# Patient Record
Sex: Female | Born: 1965 | Race: White | Hispanic: No | Smoking: Never smoker
Health system: Southern US, Community
[De-identification: ages and names within clinical notes are randomized; demographics above are authoritative.]

## PROBLEM LIST (undated history)

## (undated) DIAGNOSIS — N951 Menopausal and female climacteric states: Secondary | ICD-10-CM

## (undated) DIAGNOSIS — J45909 Unspecified asthma, uncomplicated: Secondary | ICD-10-CM

## (undated) DIAGNOSIS — T8859XA Other complications of anesthesia, initial encounter: Secondary | ICD-10-CM

## (undated) DIAGNOSIS — E039 Hypothyroidism, unspecified: Secondary | ICD-10-CM

## (undated) DIAGNOSIS — Z201 Contact with and (suspected) exposure to tuberculosis: Secondary | ICD-10-CM

## (undated) DIAGNOSIS — I10 Essential (primary) hypertension: Secondary | ICD-10-CM

## (undated) DIAGNOSIS — N6019 Diffuse cystic mastopathy of unspecified breast: Secondary | ICD-10-CM

## (undated) DIAGNOSIS — E559 Vitamin D deficiency, unspecified: Secondary | ICD-10-CM

## (undated) DIAGNOSIS — F419 Anxiety disorder, unspecified: Secondary | ICD-10-CM

## (undated) DIAGNOSIS — F341 Dysthymic disorder: Secondary | ICD-10-CM

## (undated) DIAGNOSIS — E669 Obesity, unspecified: Secondary | ICD-10-CM

## (undated) HISTORY — DX: Dysthymic disorder: F34.1

## (undated) HISTORY — DX: Menopausal and female climacteric states: N95.1

## (undated) HISTORY — DX: Diffuse cystic mastopathy of unspecified breast: N60.19

## (undated) HISTORY — PX: CHOLECYSTECTOMY: SHX55

## (undated) HISTORY — PX: ABDOMINAL HYSTERECTOMY: SHX81

## (undated) HISTORY — DX: Vitamin D deficiency, unspecified: E55.9

## (undated) HISTORY — DX: Hypothyroidism, unspecified: E03.9

## (undated) HISTORY — DX: Obesity, unspecified: E66.9

## (undated) HISTORY — PX: TOTAL ABDOMINAL HYSTERECTOMY: SHX209

## (undated) HISTORY — DX: Anxiety disorder, unspecified: F41.9

## (undated) HISTORY — PX: DG LEFT TIBIA AND FIBULA (ARMC HX): HXRAD1562

## (undated) HISTORY — PX: BREAST BIOPSY: SHX20

---

## 2000-02-27 HISTORY — PX: ANTERIOR CRUCIATE LIGAMENT REPAIR: SHX115

## 2004-07-03 ENCOUNTER — Ambulatory Visit: Payer: Self-pay | Admitting: Internal Medicine

## 2004-07-04 ENCOUNTER — Ambulatory Visit: Payer: Self-pay | Admitting: Internal Medicine

## 2004-07-20 ENCOUNTER — Ambulatory Visit: Payer: Self-pay | Admitting: Surgery

## 2008-02-27 HISTORY — PX: SEPTOPLASTY: SUR1290

## 2010-08-14 LAB — HM PAP SMEAR

## 2010-12-13 ENCOUNTER — Ambulatory Visit: Payer: Self-pay | Admitting: Obstetrics and Gynecology

## 2010-12-21 ENCOUNTER — Inpatient Hospital Stay: Payer: Self-pay | Admitting: Obstetrics and Gynecology

## 2010-12-25 LAB — PATHOLOGY REPORT

## 2011-03-26 LAB — HM MAMMOGRAPHY

## 2012-12-29 IMAGING — CT CT ABD-PELV W/ CM
1 of 4 series · 11 of 32 positions shown, 17 images · non-contrast
Comparison: none

REASON FOR EXAM: duplicated uterus and cervix eval ureters for normal
location
COMMENTS:

[Series 2: abdomen · axial · 0.90mm/px · z∈[-496,-132]mm · 11 of 89 slices shown, 17 images]
[im 8/89  soft-tissue]
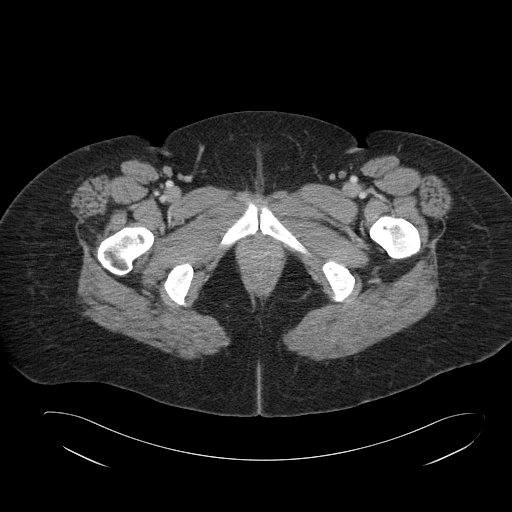
[im 8/89  bone]
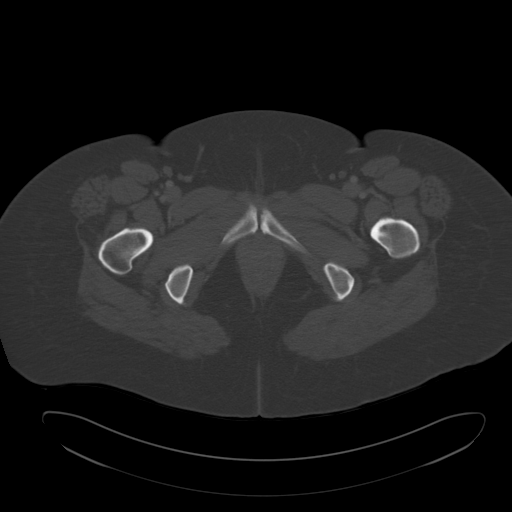
[im 15/89  soft-tissue]
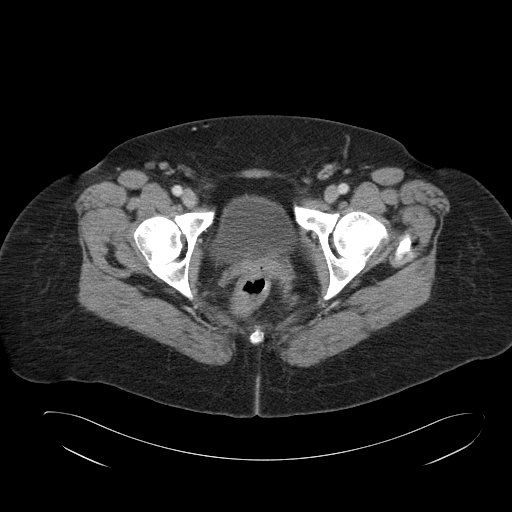
[im 23/89  soft-tissue]
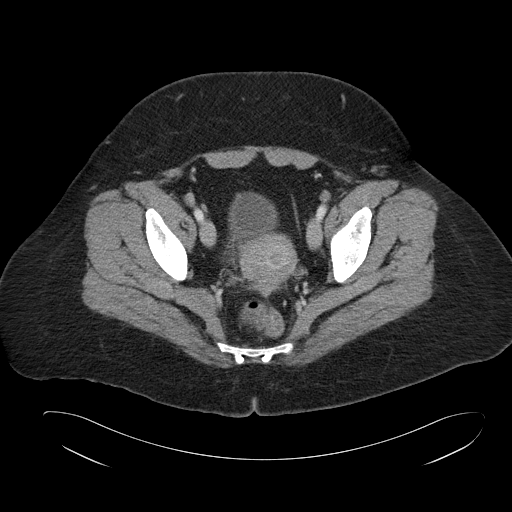
[im 30/89  soft-tissue]
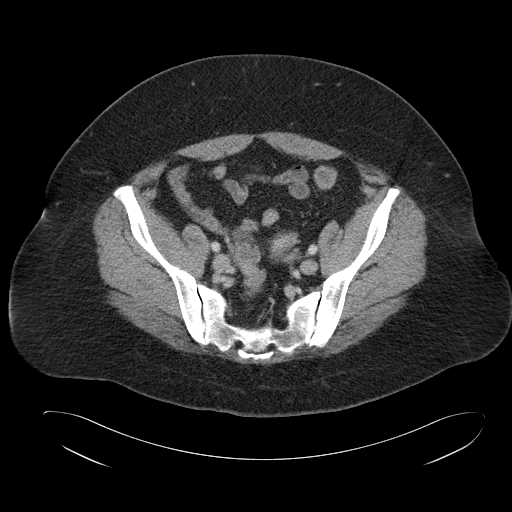
[im 37/89  soft-tissue]
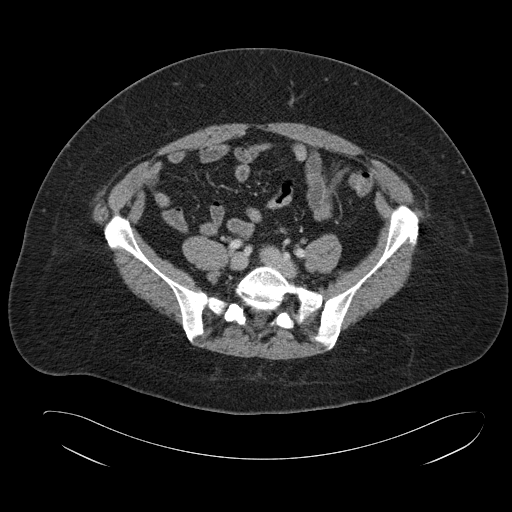
[im 45/89  soft-tissue]
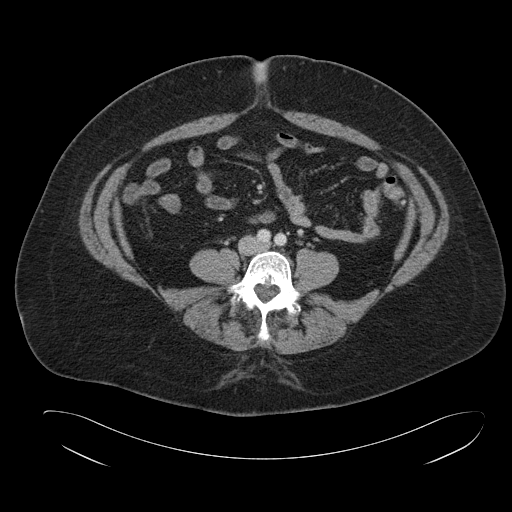
[im 52/89  soft-tissue]
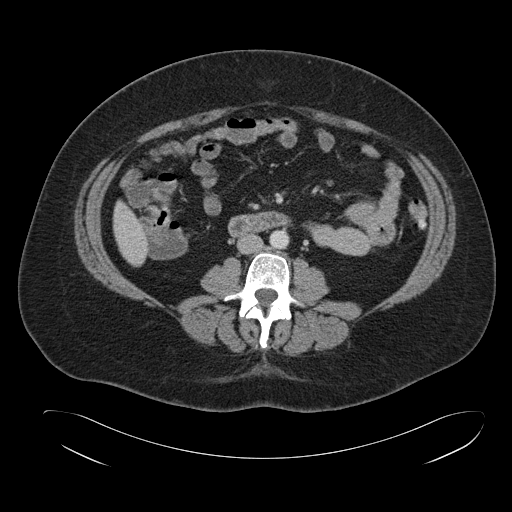
[im 59/89  soft-tissue]
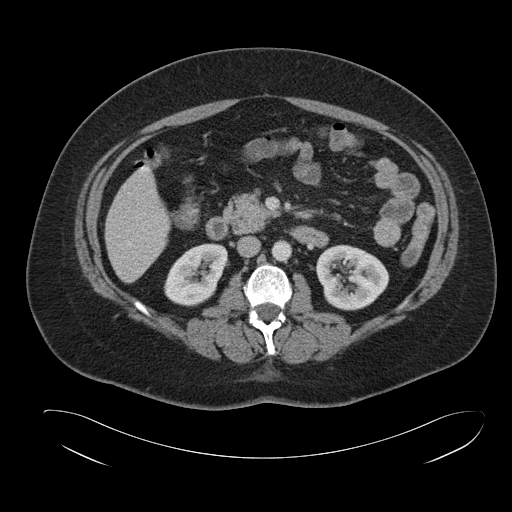
[im 59/89  lung]
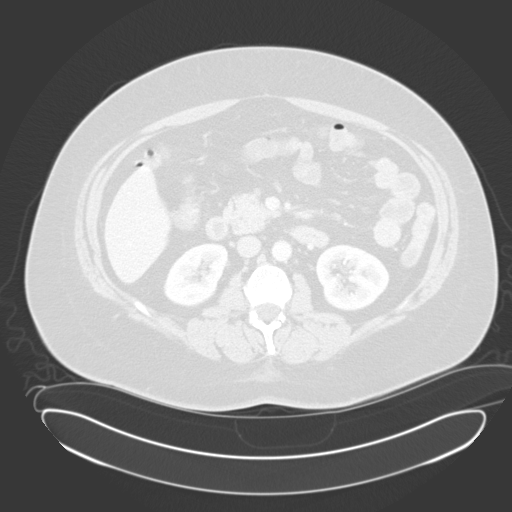
[im 67/89  soft-tissue]
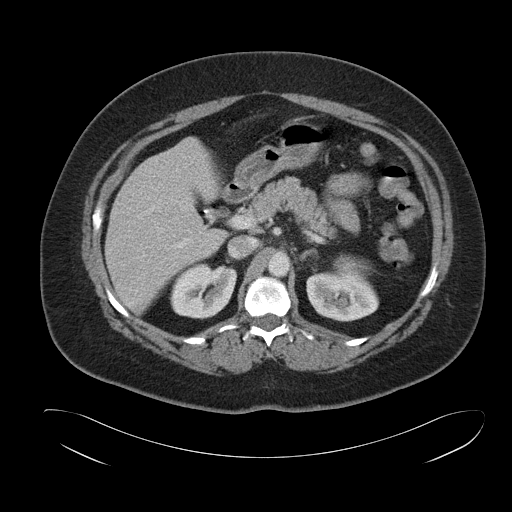
[im 67/89  lung]
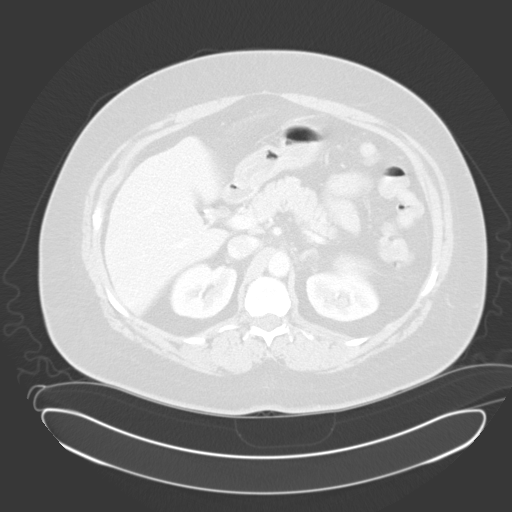
[im 67/89  bone]
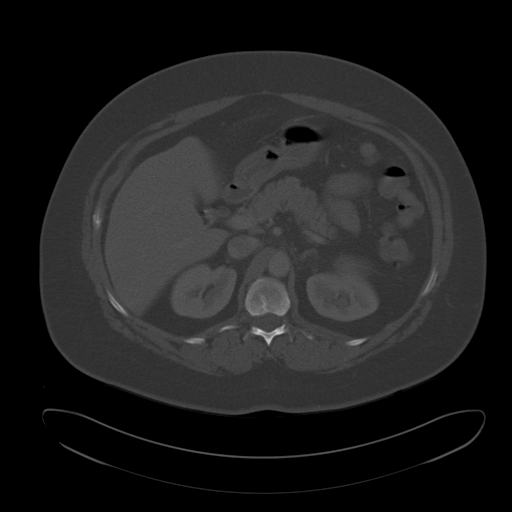
[im 74/89  soft-tissue]
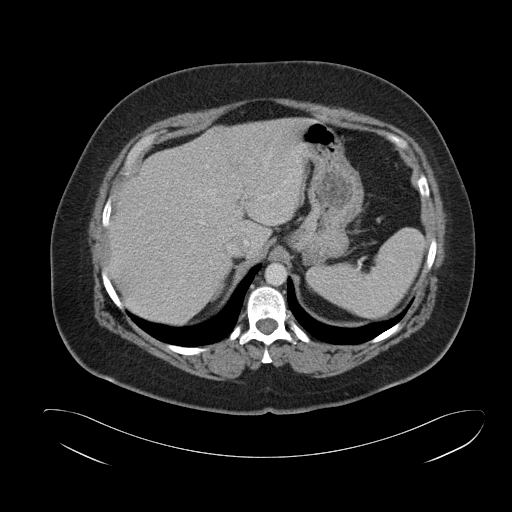
[im 74/89  lung]
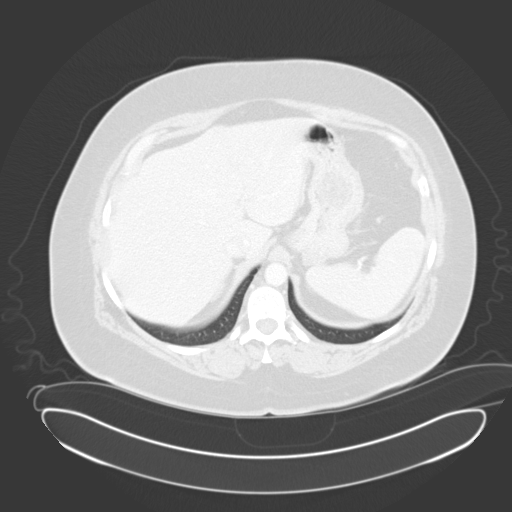
[im 81/89  soft-tissue]
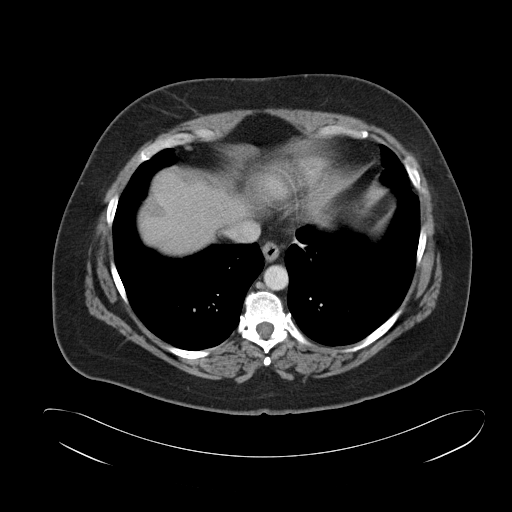
[im 81/89  lung]
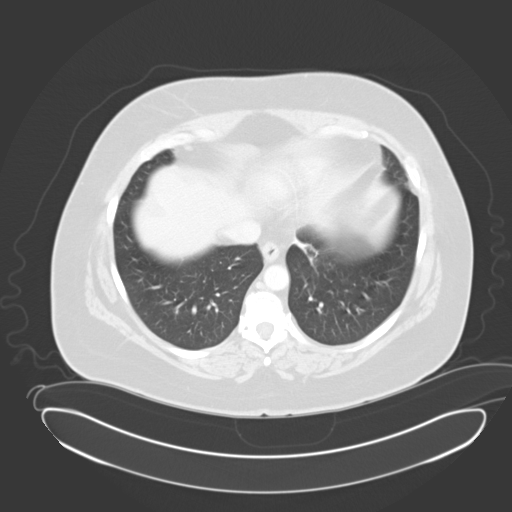

[11 of 32 positions shown; findings below may reference images not displayed]

PROCEDURE:     CT  - CT ABDOMEN / PELVIS  W  - December 13, 2010 [DATE]

RESULT:     Axial CT scanning was performed through the abdomen and pelvis
at 5 mm intervals and slice thicknesses following intravenous administration
of 100 cc of Ysovue-WXQ. The patient did not receive oral contrast material.

The liver exhibits no focal mass nor ductal dilation. The gallbladder is
surgically absent. The pancreas, spleen, nondistended stomach, adrenal
glands, and kidneys are normal in appearance. The caliber of the abdominal
aorta is normal. The unopacified loops of small and large bowel exhibit no
acute abnormality. Within the pelvis the reported duplication of the uterus
is demonstrated. The contour suggests duplication of the uterine horns.
There is a surgical clip the left aspect of the pelvis. There is a 2 cm
diameter hypodensity in the right aspect of the pelvis which likely related
either to the sigmoid colon or to the right adnexal structures. On delayed
images contrast within the renal collecting systems and ureters and urinary
bladder is normal in appearance.

The lumbar vertebral bodies are preserved in height. The lung bases are
clear.
IMPRESSION: 1. The course of the ureters appears normal. The inferior aspect of the
right ureter does appear to course posterior and inferior to a 2 cm diameter
rounded soft tissue density demonstrated best on images 12 through 14 which
is either related to the sigmoid colon or to the right adnexal region there
are
2. The uterine horns appear duplicated. The known duplicated cervical
regions cannot be appreciated on the CT images.
3. I do not see abnormality elsewhere within the abdomen or pelvis. No renal
anomalies are demonstrated.

## 2013-04-28 ENCOUNTER — Emergency Department: Payer: Self-pay | Admitting: Emergency Medicine

## 2014-08-17 ENCOUNTER — Other Ambulatory Visit: Payer: Self-pay | Admitting: Family Medicine

## 2014-08-17 DIAGNOSIS — Z1231 Encounter for screening mammogram for malignant neoplasm of breast: Secondary | ICD-10-CM

## 2014-08-19 ENCOUNTER — Ambulatory Visit
Admission: RE | Admit: 2014-08-19 | Discharge: 2014-08-19 | Disposition: A | Discharge status: Home / Self Care | Payer: BC Managed Care – PPO | Source: Ambulatory Visit | Attending: Family Medicine | Admitting: Family Medicine

## 2014-08-19 DIAGNOSIS — Z1231 Encounter for screening mammogram for malignant neoplasm of breast: Secondary | ICD-10-CM | POA: Insufficient documentation

## 2014-09-23 ENCOUNTER — Telehealth: Payer: Self-pay | Admitting: Unknown Physician Specialty

## 2014-09-23 NOTE — Telephone Encounter (Signed)
Routing to provider  

## 2014-09-23 NOTE — Telephone Encounter (Signed)
Patient called requestiong the she needs a referral to have a colonoscopy if someone can call her back regarding this, thanks.

## 2014-09-24 NOTE — Telephone Encounter (Signed)
Called and spoke to patient who stated she was not able to go to the colonoscopy that was set up because she started school. Patient scheduled an appointment for a physical on 10/08/14.

## 2014-09-24 NOTE — Telephone Encounter (Signed)
She is due for a physical.  It looks like I ordered one last August.  Did that not get set up?

## 2014-10-07 DIAGNOSIS — N951 Menopausal and female climacteric states: Secondary | ICD-10-CM | POA: Insufficient documentation

## 2014-10-07 DIAGNOSIS — E559 Vitamin D deficiency, unspecified: Secondary | ICD-10-CM | POA: Insufficient documentation

## 2014-10-07 DIAGNOSIS — F32A Depression, unspecified: Secondary | ICD-10-CM

## 2014-10-07 DIAGNOSIS — E669 Obesity, unspecified: Secondary | ICD-10-CM

## 2014-10-07 DIAGNOSIS — F329 Major depressive disorder, single episode, unspecified: Secondary | ICD-10-CM | POA: Insufficient documentation

## 2014-10-07 DIAGNOSIS — E785 Hyperlipidemia, unspecified: Secondary | ICD-10-CM

## 2014-10-07 DIAGNOSIS — F419 Anxiety disorder, unspecified: Secondary | ICD-10-CM | POA: Insufficient documentation

## 2014-10-07 DIAGNOSIS — G4730 Sleep apnea, unspecified: Secondary | ICD-10-CM | POA: Insufficient documentation

## 2014-10-08 ENCOUNTER — Ambulatory Visit (INDEPENDENT_AMBULATORY_CARE_PROVIDER_SITE_OTHER): Payer: BC Managed Care – PPO | Admitting: Unknown Physician Specialty

## 2014-10-08 ENCOUNTER — Encounter: Payer: Self-pay | Admitting: Unknown Physician Specialty

## 2014-10-08 VITALS — BP 133/82 | HR 74 | Temp 97.9°F | Ht 63.0 in | Wt 247.2 lb

## 2014-10-08 DIAGNOSIS — F329 Major depressive disorder, single episode, unspecified: Secondary | ICD-10-CM

## 2014-10-08 DIAGNOSIS — E668 Other obesity: Secondary | ICD-10-CM

## 2014-10-08 DIAGNOSIS — Z0000 Encounter for general adult medical examination without abnormal findings: Secondary | ICD-10-CM | POA: Diagnosis not present

## 2014-10-08 DIAGNOSIS — F32A Depression, unspecified: Secondary | ICD-10-CM

## 2014-10-08 MED ORDER — CHOLESTYRAMINE 4 G PO PACK: 4.0000 g | PACK | Freq: Two times a day (BID) | ORAL | Status: DC

## 2014-10-08 MED ORDER — VENLAFAXINE HCL ER 150 MG PO TB24: 150.0000 mg | ORAL_TABLET | ORAL | Status: DC

## 2014-10-08 NOTE — Progress Notes (Signed)
BP 133/82 mmHg  Pulse 74  Temp(Src) 97.9 F (36.6 C)  Ht  (1.6 m)  Wt 247 lb 3.2 oz (112.129 kg)  BMI 43.80 kg/m2  SpO2 97%  LMP 12/24/2012 (Exact Date)   Subjective:    Patient ID: Jessica Brennan, female    DOB: [birth year], 49 y.o.   MRN: [id number]  HPI: Jessica Brennan is a 49 y.o. female  Chief Complaint  Patient presents with  . Annual Exam   Obesity Pt states her depression is better, but now she is really frustrated about her weight.  It bothers her because she can't do what she wants to do.  She has tried [employer] and is considering going to [employer].  She is also going to try meet-up groups.  She is interested in water aerobics.  She states walking around [place] is tiring due to weight.  She is concerned the Venlafaxine causes muscle aches.    Relevant past medical, surgical, family and social history reviewed and updated as indicated. Interim medical history since our last visit reviewed. Allergies and medications reviewed and updated.  Review of Systems  Constitutional: Negative.   HENT: Negative.   Eyes: Negative.   Respiratory: Negative.   Cardiovascular: Negative.   Gastrointestinal: Negative.   Endocrine: Negative.   Genitourinary: Negative.   Musculoskeletal: Negative.   Skin: Negative.   Allergic/Immunologic: Negative.   Neurological: Negative.   Hematological: Negative.   Psychiatric/Behavioral: Negative.     Per HPI unless specifically indicated above     Objective:    BP 133/82 mmHg  Pulse 74  Temp(Src) 97.9 F (36.6 C)  Ht  (1.6 m)  Wt 247 lb 3.2 oz (112.129 kg)  BMI 43.80 kg/m2  SpO2 97%  LMP 12/24/2012 (Exact Date)  Wt Readings from Last 3 Encounters:  10/08/14 247 lb 3.2 oz (112.129 kg)  05/12/14 245 lb (111.131 kg)    Physical Exam  Constitutional: She is oriented to person, place, and time. She appears well-developed and well-nourished.  HENT:  Head: Normocephalic and atraumatic.  Eyes:  Pupils are equal, round, and reactive to light. Right eye exhibits no discharge. Left eye exhibits no discharge. No scleral icterus.  Neck: Normal range of motion. Neck supple. Carotid bruit is not present. No thyromegaly present.  Cardiovascular: Normal rate, regular rhythm and normal heart sounds.  Exam reveals no gallop and no friction rub.   No murmur heard. Pulmonary/Chest: Effort normal and breath sounds normal. No respiratory distress. She has no wheezes. She has no rales.  Abdominal: Soft. Bowel sounds are normal. There is no tenderness. There is no rebound.  Genitourinary: No breast swelling, tenderness or discharge.  Musculoskeletal: Normal range of motion.  Lymphadenopathy:    She has no cervical adenopathy.  Neurological: She is alert and oriented to person, place, and time.  Skin: Skin is warm, dry and intact. No rash noted.  Psychiatric: She has a normal mood and affect. Her speech is normal and behavior is normal. Judgment and thought content normal. Cognition and memory are normal.       Assessment & Plan:   Problem List Items Addressed This Visit      Unprioritized   Depression    stable      Relevant Medications   Venlafaxine HCl 150 MG TB24   Other Relevant Orders   TSH   Extreme obesity - Primary    Discussed diet and exercise changes      Relevant Orders  Comprehensive metabolic panel   Lipid Panel w/o Chol/HDL Ratio    Other Visit Diagnoses    Annual physical exam        Relevant Orders    CBC with Differential/Platelet    Comprehensive metabolic panel    TSH    Lipid Panel w/o Chol/HDL Ratio    HIV antibody    Hepatitis C antibody    Ambulatory referral to Gastroenterology        Follow up plan: Return in about 3 months (around 01/08/2015) for to discuss weight.

## 2014-10-08 NOTE — Patient Instructions (Addendum)
Obesity Obesity is defined as having too much total body fat and a body mass index (BMI) of 30 or more. BMI is an estimate of body fat and is calculated from your height and weight. Obesity happens when you consume more calories than you can burn by exercising or performing daily physical tasks. Prolonged obesity can cause major illnesses or emergencies, such as:   Stroke.  Heart disease.  Diabetes.  Cancer.  Arthritis.  High blood pressure (hypertension).  High cholesterol.  Sleep apnea.  Erectile dysfunction.  Infertility problems. CAUSES   Regularly eating unhealthy foods.  Physical inactivity.  Certain disorders, such as an underactive thyroid (hypothyroidism), Cushing's syndrome, and polycystic ovarian syndrome.  Certain medicines, such as steroids, some depression medicines, and antipsychotics.  Genetics.  Lack of sleep. DIAGNOSIS  A health care provider can diagnose obesity after calculating your BMI. Obesity will be diagnosed if your BMI is 30 or higher.  There are other methods of measuring obesity levels. Some other methods include measuring your skinfold thickness, your waist circumference, and comparing your hip circumference to your waist circumference. TREATMENT  A healthy treatment program includes some or all of the following:  Long-term dietary changes.  Exercise and physical activity.  Behavioral and lifestyle changes.  Medicine only under the supervision of your health care provider. Medicines may help, but only if they are used with diet and exercise programs. An unhealthy treatment program includes:  Fasting.  Fad diets.  Supplements and drugs. These choices do not succeed in long-term weight control.  HOME CARE INSTRUCTIONS   Exercise and perform physical activity as directed by your health care provider. To increase physical activity, try the following:  Use stairs instead of elevators.  Park farther away from store  entrances.  Garden, bike, or walk instead of watching television or using the computer.  Eat healthy, low-calorie foods and drinks on a regular basis. Eat more fruits and vegetables. Use low-calorie cookbooks or take healthy cooking classes.  Limit fast food, sweets, and processed snack foods.  Eat smaller portions.  Keep a daily journal of everything you eat. There are many free websites to help you with this. It may be helpful to measure your foods so you can determine if you are eating the correct portion sizes.  Avoid drinking alcohol. Drink more water and drinks without calories.  Take vitamins and supplements only as recommended by your health care provider.  Weight-loss support groups, Government social research officer, counselors, and stress reduction education can also be very helpful. SEEK IMMEDIATE MEDICAL CARE IF:  You have chest pain or tightness.  You have trouble breathing or feel short of breath.  You have weakness or leg numbness.  You feel confused or have trouble talking.  You have sudden changes in your vision. MAKE SURE YOU:  Understand these instructions.  Will watch your condition.  Will get help right away if you are not doing well or get worse. Document Released: 03/22/2004 Document Revised: 06/29/2013 Document Reviewed: 03/21/2011 [facility] Patient Information 2015 [place], [place]. This information is not intended to replace advice given to you by your health care provider. Make sure you discuss any questions you have with your health care provider. Think you're too busy to work out? We have the workout for you. In minutes, high-intensity interval training (H.I.I.T.) will have you sweating, breathing hard and maximizing the health benefits of exercise without the time commitment. Best of all, it's scientifically proven to work.  What Is H.I.I.T.? SHORT WORKOUTS 101 High-intensity interval  training - referred to as H.I.I.T. - is based on the idea that short bursts  of strenuous exercise can have a big impact on the body. If moderate exercise - like a 20-minute jog - is good for your heart, lungs and metabolism, H.I.I.T. packs the benefits of that workout and more into a few minutes. It may sound too good to be true, but learning this exercise technique and adapting it to your life can mean saving hours at the gym. If you think you don't have time to exercise, H.I.I.T. may be the workout for you.  You can try it with any aerobic activity you like. The principles of H.I.I.T. can be applied to running, biking, stair climbing, swimming, jumping rope, rowing, even hopping or skipping. (Yes, skipping!)  The downside? Even though H.I.I.T. lasts only minutes, the workouts are tough, requiring you to push your body near its limit.  HOW INTENSE IS HIGH INTENSITY? High-intensity exercise is obviously not a casual stroll down the street, but it's not a run-till-your-lungs-pop explosion, either. Think breathless, not winded. Heart-pounding, not exploding. Legs pumping, but not uncontrolled.  You don't need any fancy heart rate monitors to do these workouts. Use cues from your body as a guide. In the middle of a high-intensity workout you should be able to say single words, but not complete whole sentences. So, if you can keep chatting to your workout partner during this workout, pump it up a few notches.  12-15-28 Training This simple program will help you make the most of a short workout by improving heart health and endurance. Try it with your favorite cardiovascular activity. The essentials of 12-15-28 training are simple. Run, ride or perhaps row on a rowing machine gently for 30 seconds, accelerate to a moderate pace for 20 seconds, then sprint as hard as you can for 10 seconds. (It should be called 30-20-10 training, obviously, but that is not as catchy.) Repeat.  You don't even need a stopwatch to monitor the 30-, 20-, and 10-second time changes. You can just count to  yourself, which seems to make the intervals pass more quickly.  Best of all? The grueling, all-out portion of the workout lasts for only 10 seconds. C'mon, you can do anything for 10 seconds, right?  Got 10 Minutes? A solitary minute of hard work buried in 10 minutes of activity can make a big difference.  The 10-Minute Workout If you like to run, bike, row or swim - just a little bit - this workout is a great option for you. Step 1 Warm up for 2 minutes Step 2 Pedal, run or swim all-out for 20 seconds. Repeat 2 more times Warm down for 3 minutes    GET STARTED To benefit the most from really, really short workouts, you need to build the habit of doing them into your hectic life. Ideally, you'll complete the workout three times a week. The best way to build that habit is to start small and be willing to tweak your schedule where you can to accommodate your new workout.  First set up a spot in your house for your workout, equipped with whatever you need to get the job done: sneakers, a chair, a towel, etc. Then slot your workout in before you would normally shower. (You can even do it in the bathroom.) Or wake up five minutes earlier and do it first thing in the morning, so you can head off to work feeling accomplished. Or do it during your lunch hour.  Run up your office's stairs or grab a private conference room for just a few minutes. Or work it into your commute. If you walk or bike to work, add some heavy intervals on the way home.  GET A BOOST FROM MUSIC Creating a workout playlist of high-energy tunes you love will not make your workout feel easier, but it may cause you to exercise harder without even realizing it. Best of all, if you are doing a really short workout, you need only one or two great tunes to get you through. If you are willing to try something a bit different, make your own music as you exercise. Sing, hum, clap your hands, whatever you can do to jam along to your playlist.  It may give you an extra boost to finish strong.  Find a song or podcast that's the length of your really, really short workout. By the time the song is over, you're done.  Excerpted from the [place] [employer] Well column http://www.nytimes.com/well/guides/really-really-short-workouts?smid=fb-nytwell&smtyp=pay

## 2014-10-08 NOTE — Assessment & Plan Note (Signed)
Discussed diet and exercise changes  

## 2014-10-08 NOTE — Assessment & Plan Note (Signed)
stable °

## 2014-10-09 LAB — COMPREHENSIVE METABOLIC PANEL
ALT: 22 IU/L (ref 0–32)
AST: 19 IU/L (ref 0–40)
Albumin/Globulin Ratio: 1.6 (ref 1.1–2.5)
Albumin: 4.2 g/dL (ref 3.5–5.5)
Alkaline Phosphatase: 76 IU/L (ref 39–117)
BILIRUBIN TOTAL: 0.4 mg/dL (ref 0.0–1.2)
BUN/Creatinine Ratio: 13 (ref 9–23)
BUN: 10 mg/dL (ref 6–24)
CALCIUM: 8.9 mg/dL (ref 8.7–10.2)
CO2: 22 mmol/L (ref 18–29)
Chloride: 100 mmol/L (ref 97–108)
Creatinine, Ser: 0.79 mg/dL (ref 0.57–1.00)
GFR calc Af Amer: 100 mL/min/{1.73_m2} (ref >59)
GFR, EST NON AFRICAN AMERICAN: 87 mL/min/{1.73_m2} (ref >59)
Globulin, Total: 2.7 g/dL (ref 1.5–4.5)
Glucose: 92 mg/dL (ref 65–99)
Potassium: 4.5 mmol/L (ref 3.5–5.2)
Sodium: 140 mmol/L (ref 134–144)
TOTAL PROTEIN: 6.9 g/dL (ref 6.0–8.5)

## 2014-10-09 LAB — CBC WITH DIFFERENTIAL/PLATELET
Basophils Absolute: 0 10*3/uL (ref 0.0–0.2)
Basos: 0 %
EOS (ABSOLUTE): 0.3 10*3/uL (ref 0.0–0.4)
Eos: 4 %
HEMATOCRIT: 40.9 % (ref 34.0–46.6)
Hemoglobin: 13.6 g/dL (ref 11.1–15.9)
Immature Grans (Abs): 0 10*3/uL (ref 0.0–0.1)
Immature Granulocytes: 0 %
LYMPHS ABS: 2.8 10*3/uL (ref 0.7–3.1)
LYMPHS: 33 %
MCH: 29.6 pg (ref 26.6–33.0)
MCHC: 33.3 g/dL (ref 31.5–35.7)
MCV: 89 fL (ref 79–97)
MONOCYTES: 7 %
Monocytes Absolute: 0.6 10*3/uL (ref 0.1–0.9)
NEUTROS ABS: 4.7 10*3/uL (ref 1.4–7.0)
Neutrophils: 56 %
PLATELETS: 264 10*3/uL (ref 150–379)
RBC: 4.59 x10E6/uL (ref 3.77–5.28)
RDW: 14.2 % (ref 12.3–15.4)
WBC: 8.5 10*3/uL (ref 3.4–10.8)

## 2014-10-09 LAB — TSH: TSH: 5.94 u[IU]/mL — ABNORMAL HIGH (ref 0.450–4.500)

## 2014-10-09 LAB — LIPID PANEL W/O CHOL/HDL RATIO
Cholesterol, Total: 267 mg/dL — ABNORMAL HIGH (ref 100–199)
HDL: 78 mg/dL
LDL Calculated: 162 mg/dL — ABNORMAL HIGH (ref 0–99)
Triglycerides: 134 mg/dL (ref 0–149)
VLDL Cholesterol Cal: 27 mg/dL (ref 5–40)

## 2014-10-09 LAB — HIV ANTIBODY (ROUTINE TESTING W REFLEX): HIV Screen 4th Generation wRfx: NONREACTIVE

## 2014-10-09 LAB — HEPATITIS C ANTIBODY

## 2014-10-11 ENCOUNTER — Telehealth: Payer: Self-pay | Admitting: Unknown Physician Specialty

## 2014-10-11 ENCOUNTER — Other Ambulatory Visit: Payer: Self-pay | Admitting: Unknown Physician Specialty

## 2014-10-11 ENCOUNTER — Encounter: Payer: Self-pay | Admitting: Unknown Physician Specialty

## 2014-10-11 MED ORDER — LEVOTHYROXINE SODIUM 50 MCG PO TABS: 50.0000 ug | ORAL_TABLET | Freq: Every day | ORAL | Status: DC

## 2014-10-11 NOTE — Telephone Encounter (Signed)
Yes, I did about her labs.  I will call if my 11 doesn't show up

## 2014-10-11 NOTE — Telephone Encounter (Signed)
I dont think I called this patient...did you?

## 2014-10-11 NOTE — Addendum Note (Signed)
Addended by: Gabriel Cirri on: 10/11/2014 03:08 PM   Modules accepted: Kipp Brood

## 2014-10-11 NOTE — Telephone Encounter (Signed)
Pt called stating she is returning [name]'s call. Thanks.

## 2014-10-14 ENCOUNTER — Other Ambulatory Visit: Payer: Self-pay

## 2014-10-14 ENCOUNTER — Telehealth: Payer: Self-pay

## 2014-10-14 NOTE — Telephone Encounter (Signed)
Gastroenterology Pre-Procedure Review  Request Date: 11-26-14 Requesting Physician: Gabriel Cirri, NP  PATIENT REVIEW QUESTIONS: The patient responded to the following health history questions as indicated:    1. Are you having any GI issues? no 2. Do you have a personal history of Polyps? no 3. Do you have a family history of Colon Cancer or Polyps? no 4. Diabetes Mellitus? no 5. Joint replacements in the past 12 months?no 6. Major health problems in the past 3 months?no 7. Any artificial heart valves, MVP, or defibrillator?no    MEDICATIONS & ALLERGIES:    Patient reports the following regarding taking any anticoagulation/antiplatelet therapy:   Plavix, Coumadin, Eliquis, Xarelto, Lovenox, Pradaxa, Brilinta, or Effient? no Aspirin? no  Patient confirms/reports the following medications:  Current Outpatient Prescriptions  Medication Sig Dispense Refill  . Cholecalciferol (VITAMIN D3) 1000 UNITS CAPS Take by mouth.    . cholestyramine (QUESTRAN) 4 G packet Take 1 packet (4 g total) by mouth 2 (two) times daily. 60 each 12  . levothyroxine (SYNTHROID, LEVOTHROID) 50 MCG tablet Take 1 tablet (50 mcg total) by mouth daily. 90 tablet 0  . Multiple Vitamin (MULTI-VITAMINS) TABS Take by mouth.    . Venlafaxine HCl 150 MG TB24 Take 1 tablet (150 mg total) by mouth 1 day or 1 dose. 30 each 12   No current facility-administered medications for this visit.    Patient confirms/reports the following allergies:  Allergies  Allergen Reactions  . Codeine Sulfate Hives and Other (See Comments)    Stomachache, GI upset, welts  . Other Other (See Comments)    Mold- Sinus infections  . Pollen Extract Other (See Comments)    sneezing    No orders of the defined types were placed in this encounter.    AUTHORIZATION INFORMATION Primary Insurance: 1D#: Group #:  Secondary Insurance: 1D#: Group #:  SCHEDULE INFORMATION: Date: 11-26-14 Time: Location: [facility]

## 2014-11-23 NOTE — Discharge Instructions (Signed)

## 2014-11-26 ENCOUNTER — Ambulatory Visit
Admission: RE | Admit: 2014-11-26 | Payer: BC Managed Care – PPO | Source: Ambulatory Visit | Admitting: Gastroenterology

## 2014-11-26 ENCOUNTER — Encounter: Admission: RE | Payer: Self-pay | Source: Ambulatory Visit

## 2014-11-26 SURGERY — COLONOSCOPY WITH PROPOFOL
Anesthesia: Choice

## 2015-01-10 ENCOUNTER — Ambulatory Visit: Payer: BC Managed Care – PPO | Admitting: Unknown Physician Specialty

## 2015-01-12 ENCOUNTER — Encounter: Payer: Self-pay | Admitting: Unknown Physician Specialty

## 2015-01-12 ENCOUNTER — Ambulatory Visit (INDEPENDENT_AMBULATORY_CARE_PROVIDER_SITE_OTHER): Payer: BC Managed Care – PPO | Admitting: Unknown Physician Specialty

## 2015-01-12 VITALS — BP 119/74 | HR 66 | Temp 98.5°F | Ht 62.6 in | Wt 247.0 lb

## 2015-01-12 DIAGNOSIS — Z23 Encounter for immunization: Secondary | ICD-10-CM | POA: Diagnosis not present

## 2015-01-12 DIAGNOSIS — E039 Hypothyroidism, unspecified: Secondary | ICD-10-CM | POA: Diagnosis not present

## 2015-01-12 NOTE — Progress Notes (Signed)
   BP 119/74 mmHg  Pulse 66  Temp(Src) 98.5 F (36.9 C)  Ht 5' 2.6" (1.59 m)  Wt 247 lb (112.038 kg)  BMI 44.32 kg/m2  SpO2 97%  LMP 12/24/2012 (Exact Date)   Subjective:    Patient ID: Jessica Brennan, female    DOB: 12-Mar-1965, 49 y.o.   MRN: [id number]  HPI: Jessica Brennan is a 49 y.o. female  Chief Complaint  Patient presents with  . Follow-up    pt states she is here for 3 month follow-up after being put on levothyroxine   Hypothyroid Pt has noted a little more energy and improved concentration.  She is exercising but not dieting.  She thinks she might need CBT to help with her stress.  She has a stressful job and is "broke" much of the time.  She is going to the gym and using recumbent bike, walking dog, swimming, and doing yoga.  This helps her mood and energy.     Relevant past medical, surgical, family and social history reviewed and updated as indicated. Interim medical history since our last visit reviewed. Allergies and medications reviewed and updated.  Review of Systems  Per HPI unless specifically indicated above     Objective:    BP 119/74 mmHg  Pulse 66  Temp(Src) 98.5 F (36.9 C)  Ht 5' 2.6" (1.59 m)  Wt 247 lb (112.038 kg)  BMI 44.32 kg/m2  SpO2 97%  LMP 12/24/2012 (Exact Date)  Wt Readings from Last 3 Encounters:  01/12/15 247 lb (112.038 kg)  10/08/14 247 lb 3.2 oz (112.129 kg)  05/12/14 245 lb (111.131 kg)    Physical Exam  Constitutional: She is oriented to person, place, and time. She appears well-developed and well-nourished. No distress.  HENT:  Head: Normocephalic and atraumatic.  Eyes: Conjunctivae and lids are normal. Right eye exhibits no discharge. Left eye exhibits no discharge. No scleral icterus.  Cardiovascular: Normal rate, regular rhythm and normal heart sounds.   Pulmonary/Chest: Effort normal and breath sounds normal. No respiratory distress.  Abdominal: Normal appearance. There is no splenomegaly or hepatomegaly.   Musculoskeletal: Normal range of motion.  Neurological: She is alert and oriented to person, place, and time.  Skin: Skin is warm, dry and intact. No rash noted. No pallor.  Psychiatric: She has a normal mood and affect. Her behavior is normal. Judgment and thought content normal.      Assessment & Plan:   Problem List Items Addressed This Visit      Unprioritized   Hypothyroidism   Relevant Orders   TSH    Other Visit Diagnoses    Immunization due    -  Primary    Relevant Orders    Flu Vaccine QUAD 36+ mos IM (Completed)        Follow up plan: Return in about 3 months (around 04/14/2015) for Lipid panel.

## 2015-01-13 ENCOUNTER — Encounter: Payer: Self-pay | Admitting: Unknown Physician Specialty

## 2015-01-13 LAB — TSH: TSH: 3.38 u[IU]/mL (ref 0.450–4.500)

## 2015-01-19 ENCOUNTER — Other Ambulatory Visit: Payer: Self-pay | Admitting: Unknown Physician Specialty

## 2015-03-08 ENCOUNTER — Telehealth: Payer: Self-pay

## 2015-03-08 MED ORDER — VENLAFAXINE HCL ER 150 MG PO CP24: 150.0000 mg | ORAL_CAPSULE | Freq: Every day | ORAL | Status: DC

## 2015-03-08 NOTE — Telephone Encounter (Signed)
Pharmacy sent a fax asking if we could change the prescription for Venlafaxine HCL ER 150 MG Tab to capsules. Pharmacy is [facility] [address].

## 2015-04-16 ENCOUNTER — Other Ambulatory Visit: Payer: Self-pay | Admitting: Unknown Physician Specialty

## 2015-04-18 ENCOUNTER — Ambulatory Visit: Payer: BC Managed Care – PPO | Admitting: Unknown Physician Specialty

## 2015-04-20 ENCOUNTER — Encounter: Payer: Self-pay | Admitting: Unknown Physician Specialty

## 2015-09-05 ENCOUNTER — Other Ambulatory Visit: Payer: Self-pay

## 2015-09-05 MED ORDER — VENLAFAXINE HCL ER 150 MG PO CP24: 150.0000 mg | ORAL_CAPSULE | Freq: Every day | ORAL | Status: DC

## 2015-10-27 ENCOUNTER — Other Ambulatory Visit: Payer: Self-pay | Admitting: Unknown Physician Specialty

## 2015-11-19 ENCOUNTER — Other Ambulatory Visit: Payer: Self-pay | Admitting: Unknown Physician Specialty

## 2015-11-25 ENCOUNTER — Other Ambulatory Visit: Payer: Self-pay

## 2015-11-25 MED ORDER — CHOLESTYRAMINE 4 G PO PACK: 4.0000 g | PACK | Freq: Two times a day (BID) | ORAL | 0 refills | Status: DC

## 2015-12-08 ENCOUNTER — Other Ambulatory Visit: Payer: Self-pay | Admitting: Unknown Physician Specialty

## 2015-12-08 NOTE — Telephone Encounter (Signed)
Pt called stated she needs a refill on Levothyroxine. [facility] [address] in [place]. Thanks.

## 2015-12-09 MED ORDER — LEVOTHYROXINE SODIUM 50 MCG PO TABS: 50.0000 ug | ORAL_TABLET | Freq: Every day | ORAL | 1 refills | Status: DC

## 2015-12-09 NOTE — Telephone Encounter (Signed)
Tried calling patient to clarify pharmacy because we have a different one listed in the chart. Left a VM asking for patient to please return my call.

## 2015-12-09 NOTE — Telephone Encounter (Signed)
Patient returned call and stated it was the [facility] [address]. Will route to provider now.

## 2015-12-30 ENCOUNTER — Other Ambulatory Visit: Payer: Self-pay | Admitting: Unknown Physician Specialty

## 2016-03-22 ENCOUNTER — Other Ambulatory Visit: Payer: Self-pay | Admitting: Unknown Physician Specialty

## 2016-03-26 ENCOUNTER — Telehealth: Payer: Self-pay | Admitting: Unknown Physician Specialty

## 2016-03-26 DIAGNOSIS — E782 Mixed hyperlipidemia: Secondary | ICD-10-CM

## 2016-03-26 NOTE — Telephone Encounter (Signed)
Called and spoke with patient. I scheduled her a f/up visit for 03/30/16. Patient wants to come in first thing that morning to have her fasting labs done so she would not have to wait until 11 am. Jessica MaxwellCheryl can we go ahead and enter the fasting lipid for patient to have drawn before her appointment? And any other labs possibly as well.

## 2016-03-30 ENCOUNTER — Encounter: Payer: Self-pay | Admitting: Unknown Physician Specialty

## 2016-03-30 ENCOUNTER — Other Ambulatory Visit: Payer: Self-pay | Admitting: Unknown Physician Specialty

## 2016-03-30 ENCOUNTER — Ambulatory Visit (INDEPENDENT_AMBULATORY_CARE_PROVIDER_SITE_OTHER): Payer: BC Managed Care – PPO | Admitting: Unknown Physician Specialty

## 2016-03-30 ENCOUNTER — Other Ambulatory Visit: Payer: BC Managed Care – PPO

## 2016-03-30 DIAGNOSIS — E782 Mixed hyperlipidemia: Secondary | ICD-10-CM

## 2016-03-30 DIAGNOSIS — G4733 Obstructive sleep apnea (adult) (pediatric): Secondary | ICD-10-CM | POA: Diagnosis not present

## 2016-03-30 DIAGNOSIS — E039 Hypothyroidism, unspecified: Secondary | ICD-10-CM | POA: Diagnosis not present

## 2016-03-30 DIAGNOSIS — E668 Other obesity: Secondary | ICD-10-CM

## 2016-03-30 DIAGNOSIS — F322 Major depressive disorder, single episode, severe without psychotic features: Secondary | ICD-10-CM

## 2016-03-30 DIAGNOSIS — I10 Essential (primary) hypertension: Secondary | ICD-10-CM | POA: Diagnosis not present

## 2016-03-30 DIAGNOSIS — E669 Obesity, unspecified: Secondary | ICD-10-CM

## 2016-03-30 LAB — LIPID PANEL PICCOLO, WAIVED
Chol/HDL Ratio Piccolo,Waive: 2.7 mg/dL
Cholesterol Piccolo, Waived: 247 mg/dL — ABNORMAL HIGH (ref <200)
HDL CHOL PICCOLO, WAIVED: 90 mg/dL (ref >59)
LDL Chol Calc Piccolo Waived: 133 mg/dL — ABNORMAL HIGH (ref <100)
Triglycerides Piccolo,Waived: 121 mg/dL (ref <150)
VLDL CHOL CALC PICCOLO,WAIVE: 24 mg/dL (ref <30)

## 2016-03-30 MED ORDER — VENLAFAXINE HCL ER 37.5 MG PO CP24
37.5000 mg | ORAL_CAPSULE | Freq: Every day | ORAL | 3 refills | Status: DC
Start: 2016-03-30 — End: 2016-04-06

## 2016-03-30 NOTE — Patient Instructions (Addendum)
Go to [employer] _ "find a therapist"  DASH Eating Plan DASH stands for "Dietary Approaches to Stop Hypertension." The DASH eating plan is a healthy eating plan that has been shown to reduce high blood pressure (hypertension). Additional health benefits may include reducing the risk of type 2 diabetes mellitus, heart disease, and stroke. The DASH eating plan may also help with weight loss. What do I need to know about the DASH eating plan? For the DASH eating plan, you will follow these general guidelines:  Choose foods with less than 150 milligrams of sodium per serving (as listed on the food label).  Use salt-free seasonings or herbs instead of table salt or sea salt.  Check with your health care provider or pharmacist before using salt substitutes.  Eat lower-sodium products. These are often labeled as "low-sodium" or "no salt added."  Eat fresh foods. Avoid eating a lot of canned foods.  Eat more vegetables, fruits, and low-fat dairy products.  Choose whole grains. Look for the word "whole" as the first word in the ingredient list.  Choose fish and skinless chicken or [place] more often than red meat. Limit fish, poultry, and meat to 6 oz (170 g) each day.  Limit sweets, desserts, sugars, and sugary drinks.  Choose heart-healthy fats.  Eat more home-cooked food and less restaurant, buffet, and fast food.  Limit fried foods.  Do not fry foods. Cook foods using methods such as baking, boiling, grilling, and broiling instead.  When eating at a restaurant, ask that your food be prepared with less salt, or no salt if possible. What foods can I eat? Seek help from a dietitian for individual calorie needs. Grains  Whole grain or whole wheat bread. Brown rice. Whole grain or whole wheat pasta. Quinoa, bulgur, and whole grain cereals. Low-sodium cereals. Corn or whole wheat flour tortillas. Whole grain cornbread. Whole grain crackers. Low-sodium crackers. Vegetables  Fresh or  frozen vegetables (raw, steamed, roasted, or grilled). Low-sodium or reduced-sodium tomato and vegetable juices. Low-sodium or reduced-sodium tomato sauce and paste. Low-sodium or reduced-sodium canned vegetables. Fruits  All fresh, canned (in natural juice), or frozen fruits. Meat and Other Protein Products  Ground beef (85% or leaner), grass-fed beef, or beef trimmed of fat. Skinless chicken or [place]. Ground chicken or [place]. Pork trimmed of fat. All fish and seafood. Eggs. Dried beans, peas, or lentils. Unsalted nuts and seeds. Unsalted canned beans. Dairy  Low-fat dairy products, such as skim or 1% milk, 2% or reduced-fat cheeses, low-fat ricotta or cottage cheese, or plain low-fat yogurt. Low-sodium or reduced-sodium cheeses. Fats and Oils  Tub margarines without trans fats. Light or reduced-fat mayonnaise and salad dressings (reduced sodium). Avocado. Safflower, olive, or canola oils. Natural peanut or almond butter. Other  Unsalted popcorn and pretzels. The items listed above may not be a complete list of recommended foods or beverages. Contact your dietitian for more options.  What foods are not recommended? Grains  White bread. White pasta. White rice. Refined cornbread. Bagels and croissants. Crackers that contain trans fat. Vegetables  Creamed or fried vegetables. Vegetables in a cheese sauce. Regular canned vegetables. Regular canned tomato sauce and paste. Regular tomato and vegetable juices. Fruits  Canned fruit in light or heavy syrup. Fruit juice. Meat and Other Protein Products  Fatty cuts of meat. Ribs, chicken wings, bacon, sausage, bologna, salami, chitterlings, fatback, hot dogs, bratwurst, and packaged luncheon meats. Salted nuts and seeds. Canned beans with salt. Dairy  Whole or 2% milk, cream, half-and-half,  and cream cheese. Whole-fat or sweetened yogurt. Full-fat cheeses or blue cheese. Nondairy creamers and whipped toppings. Processed cheese, cheese spreads, or  cheese curds. Condiments  Onion and garlic salt, seasoned salt, table salt, and sea salt. Canned and packaged gravies. Worcestershire sauce. Tartar sauce. Barbecue sauce. Teriyaki sauce. Soy sauce, including reduced sodium. Steak sauce. Fish sauce. Oyster sauce. Cocktail sauce. Horseradish. Ketchup and mustard. Meat flavorings and tenderizers. Bouillon cubes. Hot sauce. Tabasco sauce. Marinades. Taco seasonings. Relishes. Fats and Oils  Butter, stick margarine, lard, shortening, ghee, and bacon fat. Coconut, palm kernel, or palm oils. Regular salad dressings. Other  Pickles and olives. Salted popcorn and pretzels. The items listed above may not be a complete list of foods and beverages to avoid. Contact your dietitian for more information.  Where can I find more information? National Heart, Lung, and Blood Institute: travelstabloid.com This information is not intended to replace advice given to you by your health care provider. Make sure you discuss any questions you have with your health care provider. Document Released: 02/01/2011 Document Revised: 07/21/2015 Document Reviewed: 12/17/2012 Elsevier Interactive Patient Education  2017 [employer].

## 2016-03-30 NOTE — Progress Notes (Signed)
BP (!) 145/81 (BP Location: Left Arm, Cuff Size: Large)   Pulse 83   Temp 98.6 F (37 C)   Ht 5\' 3"  (1.6 m)   Wt 254 lb 3.2 oz (115.3 kg)   LMP 12/24/2012 (Exact Date)   SpO2 95%   BMI 45.03 kg/m    Subjective:    Patient ID: Jessica Brennan, female    DOB: Jun 15, 1965, 51 y.o.   MRN: [id number]  HPI: Jessica Brennan is a 51 y.o. female  Chief Complaint  Patient presents with  . Depression  . Hypothyroidism   Depression Pt states her depression is getting worse.  She avoids people and activities.  She only has energy to watch people.  She is currently overwhelmed on her job.  Unable to take Wellbutrin due to a history of seizure.   [facility] 10/08[facility]2014  Decreased Interest 3 3  Down, Depressed, Hopeless 3 2  PHQ - 2 Score 6 5  Altered sleeping 3 1  Tired, decreased energy 3 3  Change in appetite 3 3  Feeling bad or failure about yourself  3 3  Trouble concentrating 1 0  Moving slowly or fidgety/restless 0 0  Suicidal thoughts 0 0  PHQ-9 Score 19 15  Difficult doing work/chores - Somewhat difficult   Hypothyroid Needs thyroid checked as hasn't been checked for a while and she is depressed  Hypertension BP is high today.  Not sleeping well and upset about her cat who is ill  Relevant past medical, surgical, family and social history reviewed and updated as indicated. Interim medical history since our last visit reviewed. Allergies and medications reviewed and updated.  Review of Systems  Per HPI unless specifically indicated above     Objective:    BP (!) 145/81 (BP Location: Left Arm, Cuff Size: Large)   Pulse 83   Temp 98.6 F (37 C)   Ht 5\' 3"  (1.6 m)   Wt 254 lb 3.2 oz (115.3 kg)   LMP 12/24/2012 (Exact Date)   SpO2 95%   BMI 45.03 kg/m   Wt Readings from Last 3 Encounters:  03/30/16 254 lb 3.2 oz (115.3 kg)  01/12/15 247 lb (112 kg)  10/08/14 247 lb 3.2 oz (112.1 kg)    Physical Exam  Constitutional: She is oriented  to person, place, and time. She appears well-developed and well-nourished. No distress.  HENT:  Head: Normocephalic and atraumatic.  Eyes: Conjunctivae and lids are normal. Right eye exhibits no discharge. Left eye exhibits no discharge. No scleral icterus.  Neck: Normal range of motion. Neck supple. No JVD present. Carotid bruit is not present.  Cardiovascular: Normal rate, regular rhythm and normal heart sounds.   Pulmonary/Chest: Effort normal and breath sounds normal.  Abdominal: Normal appearance. There is no splenomegaly or hepatomegaly.  Musculoskeletal: Normal range of motion.  Neurological: She is alert and oriented to person, place, and time.  Skin: Skin is warm, dry and intact. No rash noted. No pallor.  Psychiatric: She has a normal mood and affect. Her behavior is normal. Judgment and thought content normal.    Results for orders placed or performed in visit on 01/12/15  TSH  Result Value Ref Range   TSH 3.380 0.450 - 4.500 uIU/mL      Assessment & Plan:   Problem List Items Addressed This Visit      Unprioritized   Depression    Poor control.  Discussed finding a therapist.  Increase Effexor.  Relevant Medications   venlafaxine XR (EFFEXOR-XR) 37.5 MG 24 hr capsule   Extreme obesity (HCC)    Recent weight gain      Relevant Orders   Ambulatory referral to Sleep Studies   Hypertension    Dash diet and weight loss.  Encouraged to exercise      Hypothyroidism    Check a TSH      Sleep apnea    Never got tested.  May be contributing to depression      Relevant Orders   Ambulatory referral to Sleep Studies       Follow up plan: Return in about 6 weeks (around 05/11/2016).

## 2016-03-30 NOTE — Assessment & Plan Note (Signed)
Dash diet and weight loss.  Encouraged to exercise

## 2016-03-30 NOTE — Assessment & Plan Note (Signed)
Never got tested.  May be contributing to depression

## 2016-03-30 NOTE — Assessment & Plan Note (Signed)
Check a TSH 

## 2016-03-30 NOTE — Assessment & Plan Note (Signed)
Recent weight gain.

## 2016-03-30 NOTE — Assessment & Plan Note (Signed)
Poor control.  Discussed finding a therapist.  Increase Effexor.

## 2016-03-31 LAB — COMPREHENSIVE METABOLIC PANEL
A/G RATIO: 1.5 (ref 1.2–2.2)
ALT: 16 IU/L (ref 0–32)
AST: 13 IU/L (ref 0–40)
Albumin: 4.1 g/dL (ref 3.5–5.5)
Alkaline Phosphatase: 69 IU/L (ref 39–117)
BUN/Creatinine Ratio: 15 (ref 9–23)
BUN: 11 mg/dL (ref 6–24)
Bilirubin Total: 0.3 mg/dL (ref 0.0–1.2)
CALCIUM: 8.5 mg/dL — AB (ref 8.7–10.2)
CO2: 22 mmol/L (ref 18–29)
Chloride: 100 mmol/L (ref 96–106)
Creatinine, Ser: 0.72 mg/dL (ref 0.57–1.00)
GFR calc Af Amer: 111 mL/min/{1.73_m2} (ref >59)
GFR, EST NON AFRICAN AMERICAN: 96 mL/min/{1.73_m2} (ref >59)
Globulin, Total: 2.7 g/dL (ref 1.5–4.5)
Glucose: 108 mg/dL — ABNORMAL HIGH (ref 65–99)
POTASSIUM: 4.7 mmol/L (ref 3.5–5.2)
Sodium: 140 mmol/L (ref 134–144)
Total Protein: 6.8 g/dL (ref 6.0–8.5)

## 2016-03-31 LAB — TSH: TSH: 4.11 u[IU]/mL (ref 0.450–4.500)

## 2016-04-02 ENCOUNTER — Encounter: Payer: Self-pay | Admitting: Unknown Physician Specialty

## 2016-04-03 ENCOUNTER — Telehealth: Payer: Self-pay | Admitting: Unknown Physician Specialty

## 2016-04-03 NOTE — Telephone Encounter (Signed)
Called and left patient a VM asking for her to please return my call.  

## 2016-04-03 NOTE — Telephone Encounter (Signed)
Pt has questions about dosage of venlafaxine XR (EFFEXOR-XR) 37.5 MG 24 hr capsule.

## 2016-04-04 NOTE — Telephone Encounter (Signed)
Called and left patient a VM asking for her to please return my call.  

## 2016-04-05 NOTE — Telephone Encounter (Signed)
Patient would like to speak to someone regarding some of her medications.  She would like either [name] to call or Britney  Left a message for the patient to call me back.  Jessica BraunKaren

## 2016-04-05 NOTE — Telephone Encounter (Signed)
Called and left patient a VM asking for her to please return my call.  

## 2016-04-06 ENCOUNTER — Telehealth: Payer: Self-pay | Admitting: Unknown Physician Specialty

## 2016-04-06 DIAGNOSIS — F322 Major depressive disorder, single episode, severe without psychotic features: Secondary | ICD-10-CM

## 2016-04-06 MED ORDER — VENLAFAXINE HCL ER 37.5 MG PO CP24: 112.5000 mg | ORAL_CAPSULE | Freq: Every day | ORAL | 3 refills | Status: DC

## 2016-04-06 NOTE — Telephone Encounter (Signed)
Called and left patient a VM asking for her to please return my call to discuss medications.

## 2016-04-06 NOTE — Telephone Encounter (Signed)
Routing to provider  

## 2016-04-06 NOTE — Telephone Encounter (Signed)
See other phone note from 04/06/16.

## 2016-04-11 ENCOUNTER — Telehealth: Payer: Self-pay

## 2016-04-11 NOTE — Telephone Encounter (Signed)
Called and left patient a VM asking for her to please return my call.  

## 2016-04-11 NOTE — Telephone Encounter (Signed)
Received a call from Jessica Brennan up front stating that this patient has a question still about her medication, Effexor. I tried to call patient to talk with her about it and she did not answer. Left a VM asking for her to please return my call.

## 2016-04-12 MED ORDER — VENLAFAXINE HCL ER 75 MG PO CP24: 225.0000 mg | ORAL_CAPSULE | Freq: Every day | ORAL | 3 refills | Status: DC

## 2016-04-12 NOTE — Telephone Encounter (Signed)
Received a fax from the call center stating that patient states that she is supposed to be on Effexor 75 mg, 3 capsules daily and not 37.5 mg, 3 capsules daily. Patient stated she will be out of medication soon and would like for us to send in the correct dose to her pharmacy. 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

## 2016-04-12 NOTE — Telephone Encounter (Signed)
Called and apologized to the patient for all of the mix up here recently on her medication. I let her know that the correct medication dosage has been sent in to her pharmacy.

## 2016-05-16 ENCOUNTER — Ambulatory Visit: Payer: BC Managed Care – PPO | Admitting: Unknown Physician Specialty

## 2016-06-04 ENCOUNTER — Other Ambulatory Visit: Payer: Self-pay | Admitting: Unknown Physician Specialty

## 2016-12-12 ENCOUNTER — Other Ambulatory Visit: Payer: Self-pay | Admitting: Unknown Physician Specialty

## 2016-12-12 NOTE — Telephone Encounter (Signed)
Needs follow up appointment or at least lab visit.

## 2016-12-12 NOTE — Telephone Encounter (Signed)
Called and left patient a VM asking for her to please return my call to schedule a follow up visit.

## 2017-04-15 ENCOUNTER — Other Ambulatory Visit: Payer: Self-pay | Admitting: Unknown Physician Specialty

## 2017-06-01 ENCOUNTER — Emergency Department
Admission: EM | Admit: 2017-06-01 | Discharge: 2017-06-01 | Disposition: A | Discharge status: Home / Self Care | Payer: BC Managed Care – PPO | Attending: Emergency Medicine | Admitting: Emergency Medicine

## 2017-06-01 ENCOUNTER — Encounter: Payer: Self-pay | Admitting: Emergency Medicine

## 2017-06-01 ENCOUNTER — Other Ambulatory Visit: Payer: Self-pay

## 2017-06-01 ENCOUNTER — Emergency Department: Payer: BC Managed Care – PPO

## 2017-06-01 DIAGNOSIS — E039 Hypothyroidism, unspecified: Secondary | ICD-10-CM | POA: Diagnosis not present

## 2017-06-01 DIAGNOSIS — R0602 Shortness of breath: Secondary | ICD-10-CM | POA: Insufficient documentation

## 2017-06-01 DIAGNOSIS — J45909 Unspecified asthma, uncomplicated: Secondary | ICD-10-CM | POA: Insufficient documentation

## 2017-06-01 DIAGNOSIS — J45901 Unspecified asthma with (acute) exacerbation: Secondary | ICD-10-CM | POA: Insufficient documentation

## 2017-06-01 DIAGNOSIS — R0603 Acute respiratory distress: Secondary | ICD-10-CM | POA: Diagnosis present

## 2017-06-01 DIAGNOSIS — Z79899 Other long term (current) drug therapy: Secondary | ICD-10-CM | POA: Diagnosis not present

## 2017-06-01 DIAGNOSIS — R05 Cough: Secondary | ICD-10-CM | POA: Insufficient documentation

## 2017-06-01 HISTORY — DX: Unspecified asthma, uncomplicated: J45.909

## 2017-06-01 MED ORDER — PREDNISONE 20 MG PO TABS: 60.0000 mg | ORAL_TABLET | Freq: Every day | ORAL | 0 refills | Status: AC

## 2017-06-01 MED ORDER — IPRATROPIUM-ALBUTEROL 0.5-2.5 (3) MG/3ML IN SOLN
3.0000 mL | Freq: Once | RESPIRATORY_TRACT | Status: AC
  Administered 2017-06-01: 3 mL via RESPIRATORY_TRACT
  Filled 2017-06-01: qty 3

## 2017-06-01 MED ORDER — ALBUTEROL SULFATE (2.5 MG/3ML) 0.083% IN NEBU
5.0000 mg | INHALATION_SOLUTION | Freq: Once | RESPIRATORY_TRACT | Status: AC
  Administered 2017-06-01: 5 mg via RESPIRATORY_TRACT
  Filled 2017-06-01: qty 6

## 2017-06-01 MED ORDER — METHYLPREDNISOLONE SODIUM SUCC 125 MG IJ SOLR
125.0000 mg | Freq: Once | INTRAMUSCULAR | Status: AC
  Administered 2017-06-01: 125 mg via INTRAVENOUS
  Filled 2017-06-01: qty 2

## 2017-06-01 MED ORDER — ALBUTEROL SULFATE HFA 108 (90 BASE) MCG/ACT IN AERS: 2.0000 | INHALATION_SPRAY | Freq: Four times a day (QID) | RESPIRATORY_TRACT | 2 refills | Status: DC | PRN

## 2017-06-01 NOTE — ED Triage Notes (Signed)
Arrives with audible insp and exp wheeze. Speaking 3 to 4 word sentences. States SOB began yesterday.

## 2017-06-01 NOTE — ED Notes (Signed)
ED Provider at bedside. 

## 2017-06-01 NOTE — ED Provider Notes (Signed)
[facility] Emergency Department Provider Note  ____________________________________________  Time seen: Approximately 10:58 AM  I have reviewed the triage vital signs and the nursing notes.   HISTORY  Chief Complaint Respiratory Distress   HPI Jessica Brennan is a 52 y.o. female with a history of asthma who presents for evaluation of an asthma exacerbation. Patient reports a few days of a dry cough. Since yesterday she has had wheezing and trouble breathing which has gone progressively worse. She did not have an inhaler at home. She went to the pharmacy to get one filled this morning but he was told to come to the emergency room. She denies fever or chills, chest pain, vomiting, abdominal pain, diarrhea. She is not a smoker. She does report having asthma exacerbations when the pollen is out which is a heavy at this time of the year. She denies any personal or family history of blood clots, recent travel immobilization, leg pain or swelling, hemoptysis, exogenous hormones, or history of cancer. She denies any prior hospitalization or intubations for her asthma.  Past Medical History:  Diagnosis Date  . Anxiety   . Asthma   . Diffuse cystic mastopathy   . Dysthymic disorder   . Hypothyroidism   . Menopausal state   . Obesity   . Vitamin D deficiency     Patient Active Problem List   Diagnosis Date Noted  . Hypertension 03/30/2016  . Hypothyroidism 01/12/2015  . Menopausal state 10/07/2014  . Depression 10/07/2014  . Vitamin D deficiency 10/07/2014  . Acute anxiety 10/07/2014  . Extreme obesity 10/07/2014  . Sleep apnea 10/07/2014  . Hyperlipidemia 10/07/2014    Past Surgical History:  Procedure Laterality Date  . ABDOMINAL HYSTERECTOMY    . BREAST BIOPSY Left    1979 negative  . CHOLECYSTECTOMY      Prior to Admission medications   Medication Sig Start Date End Date Taking? Authorizing Provider  Cholecalciferol (VITAMIN D3) 1000 UNITS  CAPS Take 1 capsule by mouth daily.    Yes [provider]  Cyanocobalamin (VITAMIN B-12 PO) Take 1 tablet by mouth daily.   Yes [provider]  levothyroxine (SYNTHROID, LEVOTHROID) 50 MCG tablet TAKE 1 TABLET (50 MCG TOTAL) BY MOUTH DAILY BEFORE BREAKFAST. 12/17/16  Yes Gabriel Cirri, NP  meloxicam (MOBIC) 7.5 MG tablet Take 1 tablet by mouth 2 (two) times daily. 06/01/17  Yes [provider]  Omega-3 Fatty Acids (FISH OIL PO) Take 1 capsule by mouth daily.   Yes [provider]  TURMERIC PO Take 1 tablet by mouth daily.   Yes [provider]  venlafaxine XR (EFFEXOR-XR) 75 MG 24 hr capsule TAKE 3 CAPSULES (225 MG TOTAL) BY MOUTH DAILY WITH BREAKFAST. 04/15/17  Yes Gabriel Cirri, NP  albuterol (PROVENTIL HFA;VENTOLIN HFA) 108 (90 Base) MCG/ACT inhaler Inhale 1-2 puffs into the lungs every 4 (four) hours as needed for wheezing. 06/01/17   [provider]  albuterol (PROVENTIL HFA;VENTOLIN HFA) 108 (90 Base) MCG/ACT inhaler Inhale 2 puffs into the lungs every 6 (six) hours as needed for wheezing or shortness of breath. 06/01/17   Don Perking, [place], MD  cholestyramine (QUESTRAN) 4 g packet TAKE 1 PACKET (4 G TOTAL) BY MOUTH 2 (TWO) TIMES DAILY. Patient not taking: Reported on 06/01/2017 12/30/15   Gabriel Cirri, NP  predniSONE (DELTASONE) 20 MG tablet Take 3 tablets (60 mg total) by mouth daily for 4 days. 06/01/17 06/05/17  Nita Sickle, MD    Allergies Codeine sulfate; Other; and  Pollen extract  Family History  Problem Relation Age of Onset  . Cancer Mother        lung  . Heart disease Father   . Hyperlipidemia Sister   . Arthritis Sister   . Hyperlipidemia Brother   . Arthritis Brother   . Cancer Paternal Grandmother        uterine  . Stroke Paternal Grandfather   . Arthritis Brother   . Hyperlipidemia Brother   . Arthritis Brother   . Arthritis Sister     Social History Social History   Tobacco Use  . Smoking status: Never  Smoker  . Smokeless tobacco: Never Used  Substance Use Topics  . Alcohol use: Yes    Alcohol/week: 0.0 oz    Comment: on occasion  . Drug use: No    Review of Systems  Constitutional: Negative for fever. Eyes: Negative for visual changes. ENT: Negative for sore throat. Neck: No neck pain  Cardiovascular: Negative for chest pain. Respiratory: + shortness of breath, cough, wheezing Gastrointestinal: Negative for abdominal pain, vomiting or diarrhea. Genitourinary: Negative for dysuria. Musculoskeletal: Negative for back pain. Skin: Negative for rash. Neurological: Negative for headaches, weakness or numbness. Psych: No SI or HI  ____________________________________________   PHYSICAL EXAM:  VITAL SIGNS: ED Triage Vitals  Enc Vitals Group     BP 06/01/17 1033 (!) 160/92     Pulse Rate 06/01/17 1033 (!) 107     Resp 06/01/17 1033 20     Temp 06/01/17 1033 98.7 F (37.1 C)     Temp Source 06/01/17 1033 Oral     SpO2 06/01/17 1033 97 %     Weight 06/01/17 1034 245 lb (111.1 kg)     Height 06/01/17 1034 5\' 3"  (1.6 m)     Head Circumference --      Peak Flow --      Pain Score --      Pain Loc --      Pain Edu? --      Excl. in GC? --     Constitutional: Alert and oriented. Well appearing and in no apparent distress. HEENT:      Head: Normocephalic and atraumatic.         Eyes: Conjunctivae are normal. Sclera is non-icteric.       Mouth/Throat: Mucous membranes are moist.       Neck: Supple with no signs of meningismus. Cardiovascular: tachycardic with regular rhythm. No murmurs, gallops, or rubs. 2+ symmetrical distal pulses are present in all extremities. No JVD. Respiratory: slightly increased work of breathing, no hypoxia, decreased air movement with diffuse expiratory wheezes Gastrointestinal: Soft, non tender, and non distended with positive bowel sounds. No rebound or guarding. Musculoskeletal: Nontender with normal range of motion in all extremities. No  edema, cyanosis, or erythema of extremities. Neurologic: Normal speech and language. Face is symmetric. Moving all extremities. No gross focal neurologic deficits are appreciated. Skin: Skin is warm, dry and intact. No rash noted. Psychiatric: Mood and affect are normal. Speech and behavior are normal.  ____________________________________________   LABS (all labs ordered are listed, but only abnormal results are displayed)  Labs Reviewed - No data to display ____________________________________________  EKG  ED ECG REPORT I, Nita Sickle, the attending physician, personally viewed and interpreted this ECG.  Sinus tachycardia, rate of 109, normal intervals, normal axis, no elevations or depressions. No prior for comparsion  ____________________________________________  RADIOLOGY  I have personally reviewed the images performed during this visit and  I agree with the Radiologist's read.   Interpretation by Radiologist:  Dg Chest 2 View  Result Date: 06/01/2017 CLINICAL DATA:  Shortness of breath. EXAM: CHEST - 2 VIEW COMPARISON:  None. FINDINGS: The heart size and mediastinal contours are within normal limits. There is mild bronchial thickening bilaterally which may be consistent with acute bronchitis. No focal airspace consolidation, pulmonary edema, pneumothorax, nodule or pleural fluid is identified. The visualized skeletal structures are unremarkable. IMPRESSION: Mild bilateral bronchial thickening which may be consistent with bronchitis. Electronically Signed   By: Irish LackGlenn  Yamagata M.D.   On: 06/01/2017 11:20     ____________________________________________   PROCEDURES  Procedure(s) performed: None Procedures Critical Care performed:  None ____________________________________________   INITIAL IMPRESSION / ASSESSMENT AND PLAN / ED COURSE   52 y.o. female with a history of asthma who presents for evaluation of an asthma exacerbation in the setting of pollen season  and a dry cough. patient is well-appearing with mild increased work of breathing, normal sats, decreased air movement bilaterally with diffuse expiratory wheezes consistent with asthma exacerbation. The patient feels improved after one DuoNeb in the waiting room. We'll give 2 more in 125 mg of Solu-Medrol. Chest x-ray and EKG pending. PE was considered but thought to be unlikely at this time, with no risk factors and presentation consistent with asthma. Will reassess after treatments    _________________________ 12:45 PM on 06/01/2017 -----------------------------------------  chest x-ray with no infiltrate. Patient is now moving great air with full resolution of her symptoms, no longer wheezing on the normal work of breathing. EKG with no evidence of ischemia. Patient be discharged home with albuterol, prednisone, and follow up with primary care doctor. Discussed return precautions with patient.   As part of my medical decision making, I reviewed the following data within the electronic MEDICAL RECORD NUMBER Nursing notes reviewed and incorporated, Labs reviewed , EKG interpreted , Radiograph reviewed , Notes from prior ED visits and [place] Controlled Substance Database    Pertinent labs & imaging results that were available during my care of the patient were reviewed by me and considered in my medical decision making (see chart for details).    ____________________________________________   FINAL CLINICAL IMPRESSION(S) / ED DIAGNOSES  Final diagnoses:  Exacerbation of asthma, unspecified asthma severity, unspecified whether persistent      NEW MEDICATIONS STARTED DURING THIS VISIT:  ED Discharge Orders        Ordered    albuterol (PROVENTIL HFA;VENTOLIN HFA) 108 (90 Base) MCG/ACT inhaler  Every 6 hours PRN     06/01/17 1239    predniSONE (DELTASONE) 20 MG tablet  Daily     06/01/17 1239       Note:  This document was prepared using Dragon voice recognition software and may include  unintentional dictation errors.    Don PerkingVeronese, [place], MD 06/01/17 1245

## 2017-06-01 NOTE — Discharge Instructions (Addendum)

## 2017-06-03 ENCOUNTER — Emergency Department
Admission: EM | Admit: 2017-06-03 | Discharge: 2017-06-03 | Disposition: A | Discharge status: Home / Self Care | Payer: BC Managed Care – PPO | Attending: Student in an Organized Health Care Education/Training Program | Admitting: Student in an Organized Health Care Education/Training Program

## 2017-06-03 ENCOUNTER — Ambulatory Visit: Payer: Self-pay | Admitting: *Deleted

## 2017-06-03 ENCOUNTER — Encounter: Payer: Self-pay | Admitting: Emergency Medicine

## 2017-06-03 DIAGNOSIS — E039 Hypothyroidism, unspecified: Secondary | ICD-10-CM | POA: Diagnosis not present

## 2017-06-03 DIAGNOSIS — J40 Bronchitis, not specified as acute or chronic: Secondary | ICD-10-CM | POA: Diagnosis not present

## 2017-06-03 DIAGNOSIS — R0602 Shortness of breath: Secondary | ICD-10-CM | POA: Diagnosis present

## 2017-06-03 DIAGNOSIS — Z79899 Other long term (current) drug therapy: Secondary | ICD-10-CM | POA: Diagnosis not present

## 2017-06-03 DIAGNOSIS — I10 Essential (primary) hypertension: Secondary | ICD-10-CM | POA: Diagnosis not present

## 2017-06-03 LAB — CBC
HEMATOCRIT: 48.6 % — AB (ref 35.0–47.0)
HEMOGLOBIN: 15.9 g/dL (ref 12.0–16.0)
MCH: 28.7 pg (ref 26.0–34.0)
MCHC: 32.6 g/dL (ref 32.0–36.0)
MCV: 88 fL (ref 80.0–100.0)
Platelets: 272 10*3/uL (ref 150–440)
RBC: 5.52 MIL/uL — ABNORMAL HIGH (ref 3.80–5.20)
RDW: 13.6 % (ref 11.5–14.5)
WBC: 14.2 10*3/uL — AB (ref 3.6–11.0)

## 2017-06-03 MED ORDER — FLUTICASONE PROPIONATE 50 MCG/ACT NA SUSP: 1.0000 | Freq: Every day | NASAL | 0 refills | Status: DC

## 2017-06-03 MED ORDER — ALBUTEROL SULFATE (2.5 MG/3ML) 0.083% IN NEBU
5.0000 mg | INHALATION_SOLUTION | Freq: Once | RESPIRATORY_TRACT | Status: AC
  Administered 2017-06-03: 5 mg via RESPIRATORY_TRACT
  Filled 2017-06-03: qty 6

## 2017-06-03 MED ORDER — AEROCHAMBER MV MISC: 0 refills | Status: DC

## 2017-06-03 MED ORDER — IPRATROPIUM-ALBUTEROL 0.5-2.5 (3) MG/3ML IN SOLN
3.0000 mL | Freq: Once | RESPIRATORY_TRACT | Status: AC
Start: 2017-06-03 — End: 2017-06-03
  Administered 2017-06-03: 3 mL via RESPIRATORY_TRACT
  Filled 2017-06-03: qty 3

## 2017-06-03 NOTE — ED Provider Notes (Signed)
[facility] Emergency Department Provider Note    First MD Initiated Contact with Patient 06/03/17 1427     (approximate)  I have reviewed the triage vital signs and the nursing notes.   HISTORY  Chief Complaint Shortness of Breath    HPI Alva Garnetatricia Hoar is a 52 y.o. female history of anxiety as well as asthma presents for several days of shortness of breath occasional palpitations nasal congestion wheeze after she was recently seen at this facility over the weekend and diagnosed with bronchitis.  She does have a history of seasonal allergies.  She was given an inhaler as well as steroids and started taking Allegra with some improvement in symptoms.  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  She denies any chest pain.  No pain when taking a deep breath.  No recent surgeries.  Not on any birth control.  No previous history of blood clots.  No unilateral leg swelling.  She states that the breathing improves after albuterol.  Past Medical History:  Diagnosis Date  . Anxiety   . Asthma   . Diffuse cystic mastopathy   . Dysthymic disorder   . Hypothyroidism   . Menopausal state   . Obesity   . Vitamin D deficiency    Family History  Problem Relation Age of Onset  . Cancer Mother        lung  . Heart disease Father   . Hyperlipidemia Sister   . Arthritis Sister   . Hyperlipidemia Brother   . Arthritis Brother   . Cancer Paternal Grandmother        uterine  . Stroke Paternal Grandfather   . Arthritis Brother   . Hyperlipidemia Brother   . Arthritis Brother   . Arthritis Sister    Past Surgical History:  Procedure Laterality Date  . ABDOMINAL HYSTERECTOMY    . BREAST BIOPSY Left    1979 negative  . CHOLECYSTECTOMY     Patient Active Problem List   Diagnosis Date Noted  .  Hypertension 03/30/2016  . Hypothyroidism 01/12/2015  . Menopausal state 10/07/2014  . Depression 10/07/2014  . Vitamin D deficiency 10/07/2014  . Acute anxiety 10/07/2014  . Extreme obesity 10/07/2014  . Sleep apnea 10/07/2014  . Hyperlipidemia 10/07/2014      Prior to Admission medications   Medication Sig Start Date End Date Taking? Authorizing Provider  albuterol (PROVENTIL HFA;VENTOLIN HFA) 108 (90 Base) MCG/ACT inhaler Inhale 1-2 puffs into the lungs every 4 (four) hours as needed for wheezing. 06/01/17   [provider]  albuterol (PROVENTIL HFA;VENTOLIN HFA) 108 (90 Base) MCG/ACT inhaler Inhale 2 puffs into the lungs every 6 (six) hours as needed for wheezing or shortness of breath. 06/01/17   Nita SickleVeronese, [place], MD  Cholecalciferol (VITAMIN D3) 1000 UNITS CAPS Take 1 capsule by mouth daily.     [provider]  cholestyramine (QUESTRAN) 4 g packet TAKE 1 PACKET (4 G TOTAL) BY MOUTH 2 (TWO) TIMES DAILY. Patient not taking: Reported on 06/01/2017 12/30/15   Gabriel CirriWicker, [name], NP  Cyanocobalamin (VITAMIN B-12 PO) Take 1 tablet by mouth daily.    [provider]  levothyroxine (SYNTHROID, LEVOTHROID) 50 MCG tablet TAKE 1 TABLET (50 MCG TOTAL) BY MOUTH DAILY BEFORE BREAKFAST. 12/17/16   Gabriel CirriWicker, [name], NP  meloxicam [facility](MOBIC)  7.5 MG tablet Take 1 tablet by mouth 2 (two) times daily. 06/01/17   [provider]  Omega-3 Fatty Acids (FISH OIL PO) Take 1 capsule by mouth daily.    [provider]  predniSONE (DELTASONE) 20 MG tablet Take 3 tablets (60 mg total) by mouth daily for 4 days. 06/01/17 06/05/17  Nita Sickle, MD  TURMERIC PO Take 1 tablet by mouth daily.    [provider]  venlafaxine XR (EFFEXOR-XR) 75 MG 24 hr capsule TAKE 3 CAPSULES (225 MG TOTAL) BY MOUTH DAILY WITH BREAKFAST. 04/15/17   Gabriel Cirri, NP    Allergies Codeine sulfate; Other; and Pollen extract    Social History Social History   Tobacco Use  . Smoking  status: Never Smoker  . Smokeless tobacco: Never Used  Substance Use Topics  . Alcohol use: Yes    Alcohol/week: 0.0 oz    Comment: on occasion  . Drug use: No    Review of Systems Patient denies headaches, rhinorrhea, blurry vision, numbness, shortness of breath, chest pain, edema, cough, abdominal pain, nausea, vomiting, diarrhea, dysuria, fevers, rashes or hallucinations unless otherwise stated above in HPI. ____________________________________________   PHYSICAL EXAM:  VITAL SIGNS: Vitals:   06/03/17 1333  BP: (!) 163/90  Pulse: (!) 112  Resp: 20  Temp: 98.5 F (36.9 C)  SpO2: 95%    Constitutional: Alert and oriented. Well appearing and in no acute distress. Eyes: Conjunctivae are normal.  Head: Atraumatic. Nose: +clear congestion/rhinnorhea. Mouth/Throat: Mucous membranes are moist.   Neck: No stridor. Painless ROM.  Cardiovascular: Normal rate, regular rhythm. Grossly normal heart sounds.  Good peripheral circulation. Respiratory: Normal respiratory effort.  No retractions. Lungs with diffuse expiratory wheeze Gastrointestinal: Soft and nontender. No distention. No abdominal bruits. No CVA tenderness. Genitourinary:  Musculoskeletal: No lower extremity tenderness nor edema.  No joint effusions. Neurologic:  Normal speech and language. No gross focal neurologic deficits are appreciated. No facial droop Skin:  Skin is warm, dry and intact. No rash noted. Psychiatric: Mood and affect are normal. Speech and behavior are normal.  ____________________________________________   LABS (all labs ordered are listed, but only abnormal results are displayed)  Results for orders placed or performed during the hospital encounter of 06/03/17 (from the past 24 hour(s))  CBC     Status: Abnormal   Collection Time: 06/03/17  1:49 PM  Result Value Ref Range   WBC 14.2 (H) 3.6 - 11.0 K/uL   RBC 5.52 (H) 3.80 - 5.20 MIL/uL   Hemoglobin 15.9 12.0 - 16.0 g/dL   HCT 16.1 (H) 09.6  - 47.0 %   MCV 88.0 80.0 - 100.0 fL   MCH 28.7 26.0 - 34.0 pg   MCHC 32.6 32.0 - 36.0 g/dL   RDW 04.5 40.9 - 81.1 %   Platelets 272 150 - 440 K/uL   ____________________________________________ ____________________________________________  RADIOLOGY   ____________________________________________   PROCEDURES  Procedure(s) performed:  Procedures    Critical Care performed: no ____________________________________________   INITIAL IMPRESSION / ASSESSMENT AND PLAN / ED COURSE  Pertinent labs & imaging results that were available during my care of the patient were reviewed by me and considered in my medical decision making (see chart for details).  DDX: Bronchitis, pneumonia, CHF, PE, allergies  Neko Mcgeehan is a 52 y.o. who presents to the ED with symptoms as described above.  Seems most clinically consistent with bronchitis probable allergies possible underlying asthma.  This not clinically consistent with congestive heart failure or PE.  Symptoms are significantly improved after nebulizer treatment.  She is already on steroids which would explain part of her leukocytosis.  There is no fever no hypoxia with ambulation.  She is well-appearing in no acute distress I do believe she is stable and appropriate for further outpatient observation.  Discussed signs and symptoms for which she should return to the hospital.      As part of my medical decision making, I reviewed the following data within the electronic MEDICAL RECORD NUMBER Nursing notes reviewed and incorporated, Labs reviewed, notes from prior ED visits.   ____________________________________________   FINAL CLINICAL IMPRESSION(S) / ED DIAGNOSES  Final diagnoses:  SOB (shortness of breath)  Bronchitis      NEW MEDICATIONS STARTED DURING THIS VISIT:  New Prescriptions   No medications on file     Note:  This document was prepared using Dragon voice recognition software and may include unintentional  dictation errors.    Willy Eddy, MD 06/03/17 1540

## 2017-06-03 NOTE — Telephone Encounter (Signed)
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 B/P 170/112. O2 sat 93% on RA after using inhaler; HR 100, regular per nurse.  Pt reports SOB, speaking in short phrases, audible wheezing. Pt is diaphoretic.States was in ED Saturday for "asthma attack" B/P at that time 220/140, states down to 140/80 at D/C.Marland Kitchen. Pt given 2 respiratory treatments, and d/ced with prednisone and albuterol inhaler; reports taking as ordered. Also reports Dx of bronchitis. Afebrile. Directed pt to ED. Care advise given per protocol. Advised to have co-worker drive.   Answer Assessment - Initial Assessment Questions 1. BLOOD PRESSURE: "What is the blood pressure?" "Did you take at least two measurements 5 minutes apart?"    Work place nurse checked B/P.Marland Kitchen.. 170/112 2. ONSET: "When did you take your blood pressure?"     Few minutes prior to NT call 3. HOW: "How did you obtain the blood pressure?" (e.g., visiting nurse, automatic home BP monitor)     Nurse, manual 4. HISTORY: "Do you have a history of high blood pressure?"     yes 5. MEDICATIONS: "Are you taking any medications for blood pressure?" "Have you missed any doses recently?"     no 6. OTHER SYMPTOMS: "Do you have any symptoms?" (e.g., headache, chest pain, blurred vision, difficulty breathing, weakness)     Asthma, wheezing audible, speaking in short haltering phrases. SOB,  O2 sat 93% on RA according to nurse. Seen in ED saturday for "asthma attack." B/P 220/140 at that time.  Protocols used: HIGH BLOOD PRESSURE-A-AH

## 2017-06-03 NOTE — ED Triage Notes (Signed)
FIRST NURSE NOTE[facility].  Seen here Saturday for same. Brought from urgent care.  Speaking in full sentences.  Pt feels better than on Saturday.

## 2017-06-03 NOTE — ED Triage Notes (Signed)
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 Pt reports was seen here for the same Saturday and was prescribed an inhaler. Pt reports felt better until this am when she got to school. [facility]

## 2017-06-20 ENCOUNTER — Other Ambulatory Visit: Payer: Self-pay | Admitting: Unknown Physician Specialty

## 2017-06-20 NOTE — Telephone Encounter (Signed)
Needs to come in for blood work 

## 2017-06-21 NOTE — Telephone Encounter (Signed)
Please call and schedule patient an appointment. Has not been seen in over a year. Thanks.

## 2017-10-11 ENCOUNTER — Other Ambulatory Visit: Payer: Self-pay | Admitting: Family Medicine

## 2017-10-11 NOTE — Telephone Encounter (Signed)
Please get patient scheduled for a follow up visit 

## 2017-10-11 NOTE — Telephone Encounter (Signed)
Routing to provider  

## 2017-10-11 NOTE — Telephone Encounter (Signed)
Synthroid 50 mcg  refill Last Refill:04/22/17 # 90 Last OV: 03/30/16 with Gabriel Cirriheryl Wicker.    Note from 06/19/17 - she needs appt for refill.   No appts noted. PCP: [facility]:  [facility] [address] - [place], [place]

## 2017-10-14 NOTE — Telephone Encounter (Signed)
LVM for patient to call to schedule follow up appt in order to get refill on her medication.  Please schedule appt with any provider that has open appt  Thank you

## 2017-10-17 ENCOUNTER — Encounter: Payer: Self-pay | Admitting: Physician Assistant

## 2017-10-17 ENCOUNTER — Ambulatory Visit (INDEPENDENT_AMBULATORY_CARE_PROVIDER_SITE_OTHER): Payer: BC Managed Care – PPO | Admitting: Physician Assistant

## 2017-10-17 VITALS — BP 144/79 | HR 82 | Temp 98.1°F | Ht 62.6 in | Wt 252.8 lb

## 2017-10-17 DIAGNOSIS — F322 Major depressive disorder, single episode, severe without psychotic features: Secondary | ICD-10-CM | POA: Diagnosis not present

## 2017-10-17 DIAGNOSIS — I10 Essential (primary) hypertension: Secondary | ICD-10-CM | POA: Diagnosis not present

## 2017-10-17 DIAGNOSIS — E785 Hyperlipidemia, unspecified: Secondary | ICD-10-CM

## 2017-10-17 DIAGNOSIS — E039 Hypothyroidism, unspecified: Secondary | ICD-10-CM

## 2017-10-17 MED ORDER — AMLODIPINE BESYLATE 5 MG PO TABS: 5.0000 mg | ORAL_TABLET | Freq: Every day | ORAL | 0 refills | Status: DC

## 2017-10-17 MED ORDER — VENLAFAXINE HCL ER 75 MG PO CP24: 225.0000 mg | ORAL_CAPSULE | Freq: Every day | ORAL | 2 refills | Status: DC

## 2017-10-17 NOTE — Progress Notes (Addendum)
Subjective:    Patient ID: Jessica Brennan, female    DOB: 1965/12/03, 52 y.o.   MRN: [id number]  Jessica Brennan is a 52 y.o. female presenting on 10/17/2017 for Depression and Hypothyroidism   HPI   Has not been seen in this clinic since 03/2016. She is here for refills.   HTN: She has never been treated for blood before, she has had documented hypertension for years.  BP Readings from Last 3 Encounters: 10/17/17 (!) 144/79 06/03/17 (!) 174/73 06/01/17 (!) 150/68  Depression: Taking 225 mg effexor xr daily. Friend is in prison which increases stress. Has knee pain which causes stress too. Seeing Dr. Mack Guise at [facility] for right knee pain and replacement, reports this also causes her stress.   HLD:   HLD: Has been elevated in the past, untreated.  Lipid Panel     Component Value Date/Time   CHOL 247 (H) 03/30/2016 0857   TRIG 121 03/30/2016 0857   HDL 78 10/08/2014 1005   VLDL 24 03/30/2016 0857   LDLCALC 162 (H) 10/08/2014 1005   The 10-year ASCVD risk score Mikey Bussing DC Jr., et al., 2013) is: 2.4%   Values used to calculate the score:     Age: 89 years     Sex: Female     Is Non-Hispanic African American: No     Diabetic: No     Tobacco smoker: No     Systolic Blood Pressure: 789 mmHg     Is BP treated: Yes     HDL Cholesterol: 90 mg/dL     Total Cholesterol: 247 mg/dL  Hypothyroidism:  Lab Results  Component Value Date   TSH 4.110 03/30/2016      Social History   Tobacco Use  . Smoking status: Never Smoker  . Smokeless tobacco: Never Used  Substance Use Topics  . Alcohol use: Yes    Alcohol/week: 0.0 standard drinks    Comment: on occasion  . Drug use: No    Review of Systems Per HPI unless specifically indicated above     Objective:    BP (!) 144/79   Pulse 82   Temp 98.1 F (36.7 C) (Oral)   Ht 5' 2.6" (1.59 m)   Wt 252 lb 12.8 oz (114.7 kg)   LMP 12/24/2012 (Exact Date)   SpO2 97%   BMI 45.36 kg/m   Wt Readings from Last  3 Encounters:  10/17/17 252 lb 12.8 oz (114.7 kg)  06/03/17 245 lb (111.1 kg)  06/01/17 245 lb (111.1 kg)    Physical Exam  Constitutional: She is oriented to person, place, and time. She appears well-developed and well-nourished.  Cardiovascular: Normal rate and regular rhythm.  Pulmonary/Chest: Effort normal and breath sounds normal.  Neurological: She is alert and oriented to person, place, and time.  Skin: Skin is warm and dry.  Psychiatric: She has a normal mood and affect. Her behavior is normal.   Results for orders placed or performed during the hospital encounter of 06/03/17  CBC  Result Value Ref Range   WBC 14.2 (H) 3.6 - 11.0 K/uL   RBC 5.52 (H) 3.80 - 5.20 MIL/uL   Hemoglobin 15.9 12.0 - 16.0 g/dL   HCT 48.6 (H) 35.0 - 47.0 %   MCV 88.0 80.0 - 100.0 fL   MCH 28.7 26.0 - 34.0 pg   MCHC 32.6 32.0 - 36.0 g/dL   RDW 13.6 11.5 - 14.5 %   Platelets 272 150 - 440 K/uL  Assessment & Plan:  1. Current severe episode of major depressive disorder without psychotic features without prior episode (HCC)  - venlafaxine XR (EFFEXOR XR) 75 MG 24 hr capsule; Take 3 capsules (225 mg total) by mouth daily with breakfast.  Dispense: 90 capsule; Refill: 2  2. Essential hypertension  Will start on 5 mg amlodipine daily. See her back in one month.  - Comp Met (CMET) - amLODipine (NORVASC) 5 MG tablet; Take 1 tablet (5 mg total) by mouth daily.  Dispense: 90 tablet; Refill: 0  3. Hypothyroidism, unspecified type  TSH high, will send in 75 mcg.   - TSH  4. Hyperlipidemia, unspecified hyperlipidemia type  - Lipid Profile - Direct LDL    Follow up plan: Return in about 1 month (around 11/17/2017) for HTN, HLD, .  Carles Collet, PA-C [facility] 10/17/2017, 11:54 AM

## 2017-10-17 NOTE — Patient Instructions (Signed)

## 2017-10-18 LAB — TSH: TSH: 5.41 u[IU]/mL — ABNORMAL HIGH (ref 0.450–4.500)

## 2017-10-18 LAB — COMPREHENSIVE METABOLIC PANEL
ALT: 21 IU/L (ref 0–32)
AST: 21 IU/L (ref 0–40)
Albumin/Globulin Ratio: 1.6 (ref 1.2–2.2)
Albumin: 4.2 g/dL (ref 3.5–5.5)
Alkaline Phosphatase: 76 IU/L (ref 39–117)
BUN/Creatinine Ratio: 18 (ref 9–23)
BUN: 12 mg/dL (ref 6–24)
Bilirubin Total: 0.3 mg/dL (ref 0.0–1.2)
CO2: 25 mmol/L (ref 20–29)
Calcium: 9.7 mg/dL (ref 8.7–10.2)
Chloride: 101 mmol/L (ref 96–106)
Creatinine, Ser: 0.68 mg/dL (ref 0.57–1.00)
GFR calc Af Amer: 115 mL/min/{1.73_m2} (ref >59)
GFR calc non Af Amer: 99 mL/min/{1.73_m2} (ref >59)
Globulin, Total: 2.6 g/dL (ref 1.5–4.5)
Glucose: 106 mg/dL — ABNORMAL HIGH (ref 65–99)
Potassium: 4.7 mmol/L (ref 3.5–5.2)
Sodium: 140 mmol/L (ref 134–144)
Total Protein: 6.8 g/dL (ref 6.0–8.5)

## 2017-10-18 LAB — LIPID PANEL
Chol/HDL Ratio: 2.9 ratio (ref 0.0–4.4)
Cholesterol, Total: 297 mg/dL — ABNORMAL HIGH (ref 100–199)
HDL: 103 mg/dL (ref >39)
LDL Calculated: 172 mg/dL — ABNORMAL HIGH (ref 0–99)
Triglycerides: 109 mg/dL (ref 0–149)
VLDL Cholesterol Cal: 22 mg/dL (ref 5–40)

## 2017-10-18 LAB — LDL CHOLESTEROL, DIRECT: LDL Direct: 200 mg/dL — ABNORMAL HIGH (ref 0–99)

## 2017-10-18 MED ORDER — ATORVASTATIN CALCIUM 10 MG PO TABS: 10.0000 mg | ORAL_TABLET | Freq: Every day | ORAL | 0 refills | Status: DC

## 2017-10-18 MED ORDER — LEVOTHYROXINE SODIUM 75 MCG PO TABS: 75.0000 ug | ORAL_TABLET | Freq: Every day | ORAL | 0 refills | Status: DC

## 2017-10-18 NOTE — Addendum Note (Signed)
Addended by: Trey SailorsPOLLAK, ADRIANA M on: 10/18/2017 01:56 PM   Modules accepted: Orders

## 2017-10-18 NOTE — Addendum Note (Signed)
Addended by: Trey SailorsPOLLAK, ADRIANA M on: 10/18/2017 11:24 AM   Modules accepted: Orders

## 2017-11-10 ENCOUNTER — Other Ambulatory Visit: Payer: Self-pay | Admitting: Physician Assistant

## 2017-11-10 DIAGNOSIS — F322 Major depressive disorder, single episode, severe without psychotic features: Secondary | ICD-10-CM

## 2017-11-13 NOTE — Telephone Encounter (Signed)
Last (acute) OV: 10/17/17 Last routine OV: 03/30/16 Next OV: 11/19/17

## 2017-11-19 ENCOUNTER — Ambulatory Visit: Payer: BC Managed Care – PPO | Admitting: Physician Assistant

## 2018-01-10 ENCOUNTER — Other Ambulatory Visit: Payer: Self-pay | Admitting: Physician Assistant

## 2018-01-10 DIAGNOSIS — I10 Essential (primary) hypertension: Secondary | ICD-10-CM

## 2018-01-10 NOTE — Telephone Encounter (Signed)
Left message on machine for pt to return call to the office.  

## 2018-01-10 NOTE — Telephone Encounter (Signed)
Refill request approved, for next refill will need to see provider.

## 2018-01-12 ENCOUNTER — Other Ambulatory Visit: Payer: Self-pay | Admitting: Physician Assistant

## 2018-01-12 DIAGNOSIS — E039 Hypothyroidism, unspecified: Secondary | ICD-10-CM

## 2018-01-12 DIAGNOSIS — E785 Hyperlipidemia, unspecified: Secondary | ICD-10-CM

## 2018-01-13 NOTE — Telephone Encounter (Signed)
Refill request approved.  For next refill patient will have to be seen by provider for labs and follow-up.

## 2018-01-14 NOTE — Telephone Encounter (Signed)
Left message on machine for pt to return call to the office. Will close until pt calls back.  

## 2018-04-21 ENCOUNTER — Other Ambulatory Visit: Payer: Self-pay | Admitting: Nurse Practitioner

## 2018-04-21 DIAGNOSIS — E039 Hypothyroidism, unspecified: Secondary | ICD-10-CM

## 2018-04-21 NOTE — Telephone Encounter (Signed)
Requested medication (s) are due for refill today -yes  Requested medication (s) are on the active medication list -yes  Future visit scheduled -no  Last refill: 01/25/18  Notes to clinic: Patient is overdue for TSH recheck- call to patient- left message to call for appointment. Sent for review of 30 day courtesy since she has already received a 90 day supply.  Requested Prescriptions  Pending Prescriptions Disp Refills   levothyroxine (SYNTHROID, LEVOTHROID) 75 MCG tablet [Pharmacy Med Name: LEVOTHYROXINE 75 MCG TABLET] 90 tablet 0    Sig: TAKE 1 TABLET (75 MCG TOTAL) BY MOUTH DAILY BEFORE BREAKFAST.     Endocrinology:  Hypothyroid Agents Failed - 04/21/2018  1:42 AM      Failed - TSH needs to be rechecked within 3 months after an abnormal result. Refill until TSH is due.      Failed - TSH in normal range and within 360 days    TSH  Date Value Ref Range Status  10/17/2017 5.410 (H) 0.450 - 4.500 uIU/mL Final         Passed - Valid encounter within last 12 months    Recent Outpatient Visits          6 months ago Current severe episode of major depressive disorder without psychotic features without prior episode [facility])   [facility] Trey Sailors, PA-C   2 years ago Essential hypertension   [facility] Gabriel Cirri, NP   3 years ago Immunization due   [facility] Gabriel Cirri, NP   3 years ago Extreme obesity   [facility] Gabriel Cirri, NP              Requested Prescriptions  Pending Prescriptions Disp Refills   levothyroxine (SYNTHROID, LEVOTHROID) 75 MCG tablet [Pharmacy Med Name: LEVOTHYROXINE 75 MCG TABLET] 90 tablet 0    Sig: TAKE 1 TABLET (75 MCG TOTAL) BY MOUTH DAILY BEFORE BREAKFAST.     Endocrinology:  Hypothyroid Agents Failed - 04/21/2018  1:42 AM      Failed - TSH needs to be rechecked within 3 months after an abnormal result. Refill until TSH is due.      Failed - TSH in normal range and  within 360 days    TSH  Date Value Ref Range Status  10/17/2017 5.410 (H) 0.450 - 4.500 uIU/mL Final         Passed - Valid encounter within last 12 months    Recent Outpatient Visits          6 months ago Current severe episode of major depressive disorder without psychotic features without prior episode [facility])   [facility] Trey Sailors, PA-C   2 years ago Essential hypertension   [facility] Gabriel Cirri, NP   3 years ago Immunization due   [facility] Gabriel Cirri, NP   3 years ago Extreme obesity   [facility] Gabriel Cirri, NP

## 2018-04-21 NOTE — Telephone Encounter (Signed)
Refill request approved for 30 days.  Needs visit.

## 2018-04-21 NOTE — Telephone Encounter (Signed)
Called Jonylah to let her know that Jolene sent 30 day refill but she needs to schedule fu, no answer lmom if she calls back please get her to schedule an appointment with Jolene.

## 2018-04-21 NOTE — Telephone Encounter (Signed)
Needs follow-up, will only reorder 30 day supply

## 2018-05-07 ENCOUNTER — Encounter: Payer: Self-pay | Admitting: Nurse Practitioner

## 2018-05-07 NOTE — Telephone Encounter (Signed)
Letter printed to mail for refill appt.

## 2018-05-14 ENCOUNTER — Other Ambulatory Visit: Payer: Self-pay

## 2018-05-14 ENCOUNTER — Ambulatory Visit: Payer: BC Managed Care – PPO | Admitting: Nurse Practitioner

## 2018-05-14 ENCOUNTER — Encounter: Payer: Self-pay | Admitting: Nurse Practitioner

## 2018-05-14 VITALS — BP 135/82 | HR 73 | Temp 98.5°F | Ht 63.0 in | Wt 243.0 lb

## 2018-05-14 DIAGNOSIS — Z1239 Encounter for other screening for malignant neoplasm of breast: Secondary | ICD-10-CM

## 2018-05-14 DIAGNOSIS — F322 Major depressive disorder, single episode, severe without psychotic features: Secondary | ICD-10-CM

## 2018-05-14 DIAGNOSIS — E782 Mixed hyperlipidemia: Secondary | ICD-10-CM | POA: Diagnosis not present

## 2018-05-14 DIAGNOSIS — I10 Essential (primary) hypertension: Secondary | ICD-10-CM

## 2018-05-14 DIAGNOSIS — E039 Hypothyroidism, unspecified: Secondary | ICD-10-CM

## 2018-05-14 DIAGNOSIS — Z1211 Encounter for screening for malignant neoplasm of colon: Secondary | ICD-10-CM

## 2018-05-14 MED ORDER — ALBUTEROL SULFATE HFA 108 (90 BASE) MCG/ACT IN AERS: 2.0000 | INHALATION_SPRAY | Freq: Four times a day (QID) | RESPIRATORY_TRACT | 2 refills | Status: DC | PRN

## 2018-05-14 NOTE — Assessment & Plan Note (Signed)
Chronic, ongoing.  Continue current medication regimen.  BP at goal today. 

## 2018-05-14 NOTE — Assessment & Plan Note (Signed)
Chronic, ongoing.  Continue current medication regimen.  Check TSH today and adjust dose as needed. °

## 2018-05-14 NOTE — Progress Notes (Signed)
BP 135/82   Pulse 73   Temp 98.5 F (36.9 C) (Oral)   Ht '5\' 3"'  (1.6 m)   Wt 243 lb (110.2 kg)   LMP 12/24/2012 (Exact Date)   SpO2 97%   BMI 43.05 kg/m    Subjective:    Patient ID: Jessica Brennan, female    DOB: [birth year], 53 y.o.   MRN: [id number]  HPI: Jessica Brennan is a 53 y.o. female  Chief Complaint  Patient presents with  . Hypertension    refills/ patient will do the cologuard  . Hyperlipidemia  . Hypothyroidism   HYPERTENSION / HYPERLIPIDEMIA Currently taking Norvasc and Lipitor.   Satisfied with current treatment? yes Duration of hypertension: chronic BP monitoring frequency: not checking BP range:  BP medication side effects: no Duration of hyperlipidemia: chronic Cholesterol medication side effects: no Cholesterol supplements: none Medication compliance: good compliance Aspirin: no Recent stressors: no Recurrent headaches: no Visual changes: no Palpitations: no Dyspnea: no Chest pain: no Lower extremity edema: yes, due to arthritis Dizzy/lightheaded: no   HYPOTHYROIDISM Currently on Levothyroxine 75 MCG daily.  Last TSH 5.410 in July, missed September follow-up. Thyroid control status:stable Satisfied with current treatment? yes Medication side effects: no Medication compliance: good compliance Etiology of hypothyroidism: unknown Recent dose adjustment:no Fatigue: yes, reports at baseline due to mood Cold intolerance: no Heat intolerance: no Weight gain: no Weight loss: no Constipation: no Diarrhea/loose stools: yes, occasional with coffee intake Palpitations: no Lower extremity edema: yes, with arthritis Anxiety/depressed mood: yes , her baseline  DEPRESSION Is going to psychiatry, Dr. Leta Speller, on Tuesday.  She reports wishes to have medications changed and wishes to see psychiatry.  States she occasionally has "thoughts of dying" which have been constant for years, thinking about recent COVID 19 virus and states "sometimes I  think about getting it".  However, she denies suicidal plan and reports she is "safe".  She discussed her upcoming psychiatry appointment and hopes to start new medication which will "help me".  She is aware of suicide hotline and reports she will immediately call hotline or present to ER if a suicide plan develops.  Reports long standing struggle with depression/anxiety + her profession as a Chief Technology Officer. Mood status: exacerbated Satisfied with current treatment?: no Symptom severity: moderate  Duration of current treatment : chronic Side effects: no Medication compliance: good compliance Psychotherapy/counseling: yes in the past, not at present due to cost Previous psychiatric medications: effexor Depressed mood: yes Anxious mood: yes Anhedonia: no Significant weight loss or gain: no Insomnia: none Fatigue: yes Feelings of worthlessness or guilt: yes Impaired concentration/indecisiveness: yes Suicidal ideations: at times thinks about self harm, but reports no plan and denies SI/HI.  States she is "safe". Hopelessness: yes Crying spells: yes Depression screen [facility] 2/9 05/14/2018 10/17/2017 03/30/2016 10/08/2014  Decreased Interest '3 3 3 3  ' Down, Depressed, Hopeless '2 2 3 2  ' PHQ - 2 Score '5 5 6 5  ' Altered sleeping 0 '1 3 1  ' Tired, decreased energy '3 3 3 3  ' Change in appetite 2 0 3 3  Feeling bad or failure about yourself  '3 1 3 3  ' Trouble concentrating 3 0 1 0  Moving slowly or fidgety/restless 1 0 0 0  Suicidal thoughts 3 0 0 0  PHQ-9 Score '20 10 19 15  ' Difficult doing work/chores Very difficult - - Somewhat difficult    OBESITY: Endorses weight gain due to current mood and reports Effexor "made me gain weight".  She is  going to be attending psychiatry appointment to see if meds can be changed.  Wishes to focus on diet and exercise.  Relevant past medical, surgical, family and social history reviewed and updated as indicated. Interim medical history since our last visit  reviewed. Allergies and medications reviewed and updated.  Review of Systems  Constitutional: Negative for activity change, appetite change, diaphoresis, fatigue and fever.  Respiratory: Negative for cough, chest tightness and shortness of breath.   Cardiovascular: Negative for chest pain, palpitations and leg swelling.  Gastrointestinal: Negative for abdominal distention, abdominal pain, constipation, diarrhea, nausea and vomiting.  Endocrine: Negative for cold intolerance, heat intolerance, polydipsia, polyphagia and polyuria.  Neurological: Negative for dizziness, syncope, weakness, light-headedness, numbness and headaches.  Psychiatric/Behavioral: Positive for decreased concentration. Negative for self-injury, sleep disturbance and suicidal ideas. The patient is nervous/anxious.     Per HPI unless specifically indicated above     Objective:    BP 135/82   Pulse 73   Temp 98.5 F (36.9 C) (Oral)   Ht '5\' 3"'  (1.6 m)   Wt 243 lb (110.2 kg)   LMP 12/24/2012 (Exact Date)   SpO2 97%   BMI 43.05 kg/m   Wt Readings from Last 3 Encounters:  05/14/18 243 lb (110.2 kg)  10/17/17 252 lb 12.8 oz (114.7 kg)  06/03/17 245 lb (111.1 kg)    Physical Exam Vitals signs and nursing note reviewed.  Constitutional:      General: She is awake.     Appearance: She is well-developed. She is obese.  HENT:     Head: Normocephalic.  Eyes:     General:        Right eye: No discharge.        Left eye: No discharge.     Conjunctiva/sclera: Conjunctivae normal.     Pupils: Pupils are equal, round, and reactive to light.  Neck:     Musculoskeletal: Normal range of motion and neck supple.     Thyroid: No thyromegaly.     Vascular: No carotid bruit or JVD.  Cardiovascular:     Rate and Rhythm: Normal rate and regular rhythm.     Heart sounds: Normal heart sounds. No murmur. No gallop.   Pulmonary:     Effort: Pulmonary effort is normal.     Breath sounds: Normal breath sounds.  Abdominal:      General: Bowel sounds are normal.     Palpations: Abdomen is soft.  Musculoskeletal:     Right lower leg: No edema.     Left lower leg: No edema.  Lymphadenopathy:     Cervical: No cervical adenopathy.  Skin:    General: Skin is warm and dry.  Neurological:     Mental Status: She is alert and oriented to person, place, and time.  Psychiatric:        Attention and Perception: Attention normal.        Mood and Affect: Mood normal.        Speech: Speech normal.        Behavior: Behavior normal. Behavior is cooperative.        Thought Content: Thought content normal.        Judgment: Judgment normal.     Results for orders placed or performed in visit on 10/17/17  Comp Met (CMET)  Result Value Ref Range   Glucose 106 (H) 65 - 99 mg/dL   BUN 12 6 - 24 mg/dL   Creatinine, Ser 0.68 0.57 - 1.00 mg/dL  GFR calc non Af Amer 99 >59 mL/min/1.73   GFR calc Af Amer 115 >59 mL/min/1.73   BUN/Creatinine Ratio 18 9 - 23   Sodium 140 134 - 144 mmol/L   Potassium 4.7 3.5 - 5.2 mmol/L   Chloride 101 96 - 106 mmol/L   CO2 25 20 - 29 mmol/L   Calcium 9.7 8.7 - 10.2 mg/dL   Total Protein 6.8 6.0 - 8.5 g/dL   Albumin 4.2 3.5 - 5.5 g/dL   Globulin, Total 2.6 1.5 - 4.5 g/dL   Albumin/Globulin Ratio 1.6 1.2 - 2.2   Bilirubin Total 0.3 0.0 - 1.2 mg/dL   Alkaline Phosphatase 76 39 - 117 IU/L   AST 21 0 - 40 IU/L   ALT 21 0 - 32 IU/L  TSH  Result Value Ref Range   TSH 5.410 (H) 0.450 - 4.500 uIU/mL  Lipid Profile  Result Value Ref Range   Cholesterol, Total 297 (H) 100 - 199 mg/dL   Triglycerides 109 0 - 149 mg/dL   HDL 103 >39 mg/dL   VLDL Cholesterol Cal 22 5 - 40 mg/dL   LDL Calculated 172 (H) 0 - 99 mg/dL   Comment: Comment    Chol/HDL Ratio 2.9 0.0 - 4.4 ratio  Direct LDL  Result Value Ref Range   LDL Direct 200 (H) 0 - 99 mg/dL      Assessment & Plan:   Problem List Items Addressed This Visit      Cardiovascular and Mediastinum   Hypertension    Chronic, ongoing.  Continue  current medication regimen.  BP at goal today.      Relevant Orders   Comp Met (CMET)     Endocrine   Hypothyroidism    Chronic, ongoing.  Continue current medication regimen.  Check TSH today and adjust dose as needed.      Relevant Orders   Thyroid Panel With TSH     Other   Depression - Primary    Poor control.  Long standing.  Continue current Effexor dose at this time.  Is scheduled to see Dr. Leta Speller with psychiatry on Tuesday and is going to update provider on plan of care after appointment.  She denies SI/HI at this time and reports she is "safe" + has plan in place if such thoughts present.        Morbid obesity (Gravity)    Recommend continued focus on health diet choices and regular physical activity (30 minutes 5 days a week).      Hyperlipidemia    Chronic, ongoing.  Continue current medication regimen.  Lipid panel and CMP today.      Relevant Orders   Lipid Panel w/o Chol/HDL Ratio   Comp Met (CMET)    Other Visit Diagnoses    Breast cancer screening       Relevant Orders   MM DIGITAL SCREENING BILATERAL   Colon cancer screening       Relevant Orders   Cologuard       Follow up plan: Return in about 3 months (around 08/14/2018) for HLD/HTN and MOOD.

## 2018-05-14 NOTE — Assessment & Plan Note (Signed)
Recommend continued focus on health diet choices and regular physical activity (30 minutes 5 days a week). 

## 2018-05-14 NOTE — Patient Instructions (Addendum)
Please call to this number to schedule your mammogram. [phone]   Suicidal Feelings: How to Help Yourself Suicide is when you end your own life. There are many things you can do to help yourself feel better when struggling with these feelings. Many services and people are available to support you and others who struggle with similar feelings. If you ever feel like you may hurt yourself or others, or have thoughts about taking your own life, get help right away. To get help:  Call your local emergency services (911 in the [place]  Go to your nearest emergency department.  Call a suicide hotline to speak with a trained counselor. The following suicide hotlines are available in the [place]: ? 1-800-273-TALK 781-155-4923). ? 1-800-SUICIDE (970)691-2037). ? (628)510-5124. This is a hotline for Spanish speakers. ? 2628844198. This is a hotline for TTY users. ? 1-866-4-U-TREVOR (609)242-7838). This is a hotline for lesbian, gay, bisexual, transgender, or questioning youth. ? For a list of hotlines in [place], visit ItCheaper.dk.html  Contact a crisis center or a local suicide prevention center. To find a crisis center or suicide prevention center: ? Call your local hospital, clinic, community service organization, mental health center, social service provider, or health department. Ask for help with connecting to a crisis center. ? For a list of crisis centers in the [place], visit: suicidepreventionlifeline.org ? For a list of crisis centers in [place], visit: suicideprevention.ca How to help yourself feel better   Promise yourself that you will not do anything extreme when you have suicidal feelings. Remember, there is hope. Many people have gotten through suicidal thoughts and feelings, and you can too. If you have had these feelings before, remind yourself that you can get through them again.  Let family, friends,  teachers, or counselors know how you are feeling. Try not to separate yourself from those who care about you and want to help you. Talk with someone every day, even if you do not feel sociable. Face-to-face conversation is best to help them understand your feelings.  Contact a mental health care provider and work with this person regularly.  Make a safety plan that you can follow during a crisis. Include phone numbers of suicide prevention hotlines, mental health professionals, and trusted friends and family members you can call during an emergency. Save these numbers on your phone.  If you are thinking of taking a lot of medicine, give your medicine to someone who can give it to you as prescribed. If you are on antidepressants and are concerned you will overdose, tell your health care provider so that he or she can give you safer medicines.  Try to stick to your routines. Follow a schedule every day. Make self-care a priority.  Make a list of realistic goals, and cross them off when you achieve them. Accomplishments can give you a sense of worth.  Wait until you are feeling better before doing things that you find difficult or unpleasant.  Do things that you have always enjoyed to take your mind off your feelings. Try reading a book, or listening to or playing music. Spending time outside, in nature, may help you feel better. Follow these instructions at home:   Visit your primary health care provider every year for a checkup.  Work with a mental health care provider as needed.  Eat a well-balanced diet, and eat regular meals.  Get plenty of rest.  Exercise if you are able. Just 30 minutes of exercise each day can help you  feel better.  Take over-the-counter and prescription medicines only as told by your health care provider. Ask your mental health care provider about the possible side effects of any medicines you are taking.  Do not use alcohol or drugs, and remove these substances  from your home.  Remove weapons, poisons, knives, and other deadly items from your home. General recommendations  Keep your living space well lit.  When you are feeling well, write yourself a letter with tips and support that you can read when you are not feeling well.  Remember that life's difficulties can be sorted out with help. Conditions can be treated, and you can learn behaviors and ways of thinking that will help you. Where to find more information  National Suicide Prevention Lifeline: www.suicidepreventionlifeline.org  Hopeline: www.hopeline.com  [employer] for Suicide Prevention: https://www.ayers.com/  [employer] (for lesbian, gay, bisexual, transgender, or questioning youth): www.thetrevorproject.org Contact a health care provider if:  You feel as though you are a burden to others.  You feel agitated, angry, vengeful, or have extreme mood swings.  You have withdrawn from family and friends. Get help right away if:  You are talking about suicide or wishing to die.  You start making plans for how to commit suicide.  You feel that you have no reason to live.  You start making plans for putting your affairs in order, saying goodbye, or giving your possessions away.  You feel guilt, shame, or unbearable pain, and it seems like there is no way out.  You are frequently using drugs or alcohol.  You are engaging in risky behaviors that could lead to death. If you have any of these symptoms, get help right away. Call emergency services, go to your nearest emergency department or crisis center, or call a suicide crisis helpline. Summary  Suicide is when you take your own life.  Promise yourself that you will not do anything extreme when you have suicidal feelings.  Let family, friends, teachers, or counselors know how you are feeling.  Get help right away if you feel as though life is getting too tough to handle and you are thinking about suicide. This  information is not intended to replace advice given to you by your health care provider. Make sure you discuss any questions you have with your health care provider. Document Released: 08/19/2002 Document Revised: 09/25/2016 Document Reviewed: 09/25/2016 Elsevier Interactive Patient Education  2019 [employer].

## 2018-05-14 NOTE — Assessment & Plan Note (Signed)
Chronic, ongoing.  Continue current medication regimen.  Lipid panel and CMP today. 

## 2018-05-14 NOTE — Assessment & Plan Note (Signed)
Poor control.  Long standing.  Continue current Effexor dose at this time.  Is scheduled to see Dr. Lutricia Feil with psychiatry on Tuesday and is going to update provider on plan of care after appointment.  She denies SI/HI at this time and reports she is "safe" + has plan in place if such thoughts present.

## 2018-05-15 ENCOUNTER — Other Ambulatory Visit: Payer: Self-pay | Admitting: Nurse Practitioner

## 2018-05-15 DIAGNOSIS — E782 Mixed hyperlipidemia: Secondary | ICD-10-CM

## 2018-05-15 LAB — COMPREHENSIVE METABOLIC PANEL
A/G RATIO: 1.5 (ref 1.2–2.2)
ALT: 17 IU/L (ref 0–32)
AST: 16 IU/L (ref 0–40)
Albumin: 4.7 g/dL (ref 3.8–4.9)
Alkaline Phosphatase: 98 IU/L (ref 39–117)
BUN / CREAT RATIO: 13 (ref 9–23)
BUN: 11 mg/dL (ref 6–24)
Bilirubin Total: 0.4 mg/dL (ref 0.0–1.2)
CO2: 23 mmol/L (ref 20–29)
Calcium: 9.6 mg/dL (ref 8.7–10.2)
Chloride: 97 mmol/L (ref 96–106)
Creatinine, Ser: 0.82 mg/dL (ref 0.57–1.00)
GFR calc non Af Amer: 81 mL/min/{1.73_m2} (ref >59)
GFR, EST AFRICAN AMERICAN: 93 mL/min/{1.73_m2} (ref >59)
Globulin, Total: 3.2 g/dL (ref 1.5–4.5)
Glucose: 93 mg/dL (ref 65–99)
Potassium: 5 mmol/L (ref 3.5–5.2)
Sodium: 137 mmol/L (ref 134–144)
Total Protein: 7.9 g/dL (ref 6.0–8.5)

## 2018-05-15 LAB — LIPID PANEL W/O CHOL/HDL RATIO
Cholesterol, Total: 335 mg/dL — ABNORMAL HIGH (ref 100–199)
HDL: 111 mg/dL (ref >39)
LDL Calculated: 200 mg/dL — ABNORMAL HIGH (ref 0–99)
Triglycerides: 122 mg/dL (ref 0–149)
VLDL CHOLESTEROL CAL: 24 mg/dL (ref 5–40)

## 2018-05-15 LAB — THYROID PANEL WITH TSH
Free Thyroxine Index: 1.5 (ref 1.2–4.9)
T3 Uptake Ratio: 25 % (ref 24–39)
T4, Total: 5.8 ug/dL (ref 4.5–12.0)
TSH: 4.76 u[IU]/mL — ABNORMAL HIGH (ref 0.450–4.500)

## 2018-05-15 MED ORDER — ATORVASTATIN CALCIUM 20 MG PO TABS: 20.0000 mg | ORAL_TABLET | Freq: Every day | ORAL | 3 refills | Status: DC

## 2018-05-15 NOTE — Progress Notes (Signed)
Lipitor increase to 20 MG.

## 2018-05-21 ENCOUNTER — Other Ambulatory Visit: Payer: Self-pay | Admitting: Nurse Practitioner

## 2018-05-21 DIAGNOSIS — E039 Hypothyroidism, unspecified: Secondary | ICD-10-CM

## 2018-06-15 ENCOUNTER — Other Ambulatory Visit: Payer: Self-pay | Admitting: Nurse Practitioner

## 2018-06-15 DIAGNOSIS — I10 Essential (primary) hypertension: Secondary | ICD-10-CM

## 2018-06-20 ENCOUNTER — Other Ambulatory Visit: Payer: Self-pay

## 2018-07-23 ENCOUNTER — Ambulatory Visit: Payer: Self-pay | Admitting: *Deleted

## 2018-07-23 NOTE — Telephone Encounter (Signed)
Patient calls with 100.1 oral temp yesterday 98.2 oral today. Nasal congestion with slight clear runny nose. More tired than her usual. Denies SOB/CP/cough/body aches. Drinking 4-6 cups of water daily, no difficulty voiding. Reviewed Care Advice including increasing water intake, resting, humidifier.  No travels and no positive contacts. Discussed quarantine for 14 days from the beginning of symptoms, started yesterday. Mask wearing/sanitizing/hand washing, avoid being around others, wear mask if has to go out.  If fever continues to be elevated for 7 days, or any SOB/cough occurs to call back immediately. Stated she understood. Reason for Disposition . [1] COVID-19 infection diagnosed or suspected AND [2] mild symptoms (fever, cough) AND [3] no trouble breathing or other complications  Answer Assessment - Initial Assessment Questions 1. COVID-19 DIAGNOSIS: "Who made your Coronavirus (COVID-19) diagnosis?" "Was it confirmed by a positive lab test?" If not diagnosed by a HCP, ask "Are there lots of cases (community spread) where you live?" (See public health department website, if unsure)   * MAJOR community spread: high number of cases; numbers of cases are increasing; many people hospitalized.   * MINOR community spread: low number of cases; not increasing; few or no people hospitalized   Has not been diagnosed. 2. ONSET: "When did the COVID-19 symptoms start?"     yesterday 3. WORST SYMPTOM: "What is your worst symptom?" (e.g., cough, fever, shortness of breath, muscle aches)     fatigue and fever 4. COUGH: "Do you have a cough?" If so, ask: "How bad is the cough?"       no 5. FEVER: "Do you have a fever?" If so, ask: "What is your temperature, how was it measured, and when did it start?"     Yesterday was 100.1 oral today 97.7 with taking advil. 6. RESPIRATORY STATUS: "Describe your breathing?" (e.g., shortness of breath, wheezing, unable to speak)     none 7. BETTER-SAME-WORSE: "Are you  getting better, staying the same or getting worse compared to yesterday?"  If getting worse, ask, "In what way?"     feels worse today 8. HIGH RISK DISEASE: "Do you have any chronic medical problems?" (e.g., asthma, heart or lung disease, weak immune system, etc.)     asthma 9. PREGNANCY: "Is there any chance you are pregnant?" "When was your last menstrual period?"     no 10. OTHER SYMPTOMS: "Do you have any other symptoms?"  (e.g., runny nose, headache, sore throat, loss of smell)       Eye pains, SOB.  Protocols used: CORONAVIRUS (COVID-19) DIAGNOSED OR SUSPECTED-A-AH

## 2018-08-14 ENCOUNTER — Other Ambulatory Visit: Payer: Self-pay | Admitting: Nurse Practitioner

## 2018-08-14 ENCOUNTER — Telehealth: Payer: Self-pay | Admitting: Unknown Physician Specialty

## 2018-08-14 DIAGNOSIS — E039 Hypothyroidism, unspecified: Secondary | ICD-10-CM

## 2018-08-14 DIAGNOSIS — E782 Mixed hyperlipidemia: Secondary | ICD-10-CM

## 2018-08-14 DIAGNOSIS — Z131 Encounter for screening for diabetes mellitus: Secondary | ICD-10-CM

## 2018-08-14 NOTE — Telephone Encounter (Signed)
Pt called back I asked her about setting up virtual visit. She is wanting to know if she can have labs put in to do lab work tomorrow and then schedule visit. Please advise.

## 2018-08-14 NOTE — Telephone Encounter (Signed)
Called pt to set up virtual appt for tomorrow no answer left VM

## 2018-08-14 NOTE — Telephone Encounter (Signed)
Called pt to reschedule appt and schedule labs for tomorrow, no answer left vm to call back

## 2018-08-14 NOTE — Progress Notes (Signed)
Future labs ordered.  

## 2018-08-14 NOTE — Telephone Encounter (Signed)
Labs ordered, will need appointment for them

## 2018-08-14 NOTE — Telephone Encounter (Signed)
Forwarding medication refill to PCP for review. 

## 2018-08-15 ENCOUNTER — Ambulatory Visit: Payer: BC Managed Care – PPO | Admitting: Nurse Practitioner

## 2018-08-15 NOTE — Telephone Encounter (Signed)
Pt called back to schedule labs. She is also wanting to have a1c checked please advise.

## 2018-08-15 NOTE — Addendum Note (Signed)
Addended by: Marnee Guarneri T on: 08/15/2018 10:22 AM   Modules accepted: Orders

## 2018-08-15 NOTE — Telephone Encounter (Signed)
Called pt to schedule labs no answer left vm

## 2018-08-15 NOTE — Telephone Encounter (Signed)
Added A1C to labs already ordered.  Can schedule outpatient labs.

## 2018-08-15 NOTE — Telephone Encounter (Signed)
Called pt to let her know that A1c has been added to labs

## 2018-08-15 NOTE — Addendum Note (Signed)
Addended by: Marnee Guarneri T on: 08/15/2018 10:21 AM   Modules accepted: Orders

## 2018-08-19 ENCOUNTER — Other Ambulatory Visit: Payer: Self-pay

## 2018-08-19 ENCOUNTER — Other Ambulatory Visit: Payer: BC Managed Care – PPO

## 2018-08-19 DIAGNOSIS — E782 Mixed hyperlipidemia: Secondary | ICD-10-CM

## 2018-08-19 DIAGNOSIS — E039 Hypothyroidism, unspecified: Secondary | ICD-10-CM

## 2018-08-19 DIAGNOSIS — Z131 Encounter for screening for diabetes mellitus: Secondary | ICD-10-CM

## 2018-08-19 LAB — LIPID PANEL PICCOLO, WAIVED
Chol/HDL Ratio Piccolo,Waive: 3.6 mg/dL
Cholesterol Piccolo, Waived: 132 mg/dL (ref <200)
HDL Chol Piccolo, Waived: 37 mg/dL — ABNORMAL LOW (ref >59)
LDL Chol Calc Piccolo Waived: 60 mg/dL (ref <100)
Triglycerides Piccolo,Waived: 174 mg/dL — ABNORMAL HIGH (ref <150)
VLDL Chol Calc Piccolo,Waive: 35 mg/dL — ABNORMAL HIGH (ref <30)

## 2018-08-19 LAB — BAYER DCA HB A1C WAIVED: HB A1C (BAYER DCA - WAIVED): 5.7 % (ref <7.0)

## 2018-08-20 LAB — COMPREHENSIVE METABOLIC PANEL
ALT: 19 IU/L (ref 0–32)
AST: 17 IU/L (ref 0–40)
Albumin/Globulin Ratio: 1.5 (ref 1.2–2.2)
Albumin: 4.2 g/dL (ref 3.8–4.9)
Alkaline Phosphatase: 84 IU/L (ref 39–117)
BUN/Creatinine Ratio: 19 (ref 9–23)
BUN: 12 mg/dL (ref 6–24)
Bilirubin Total: 0.2 mg/dL (ref 0.0–1.2)
CO2: 21 mmol/L (ref 20–29)
Calcium: 9.3 mg/dL (ref 8.7–10.2)
Chloride: 102 mmol/L (ref 96–106)
Creatinine, Ser: 0.63 mg/dL (ref 0.57–1.00)
GFR calc Af Amer: 117 mL/min/{1.73_m2} (ref >59)
GFR calc non Af Amer: 101 mL/min/{1.73_m2} (ref >59)
Globulin, Total: 2.8 g/dL (ref 1.5–4.5)
Glucose: 108 mg/dL — ABNORMAL HIGH (ref 65–99)
Potassium: 4.8 mmol/L (ref 3.5–5.2)
Sodium: 138 mmol/L (ref 134–144)
Total Protein: 7 g/dL (ref 6.0–8.5)

## 2018-08-20 LAB — THYROID PANEL WITH TSH
Free Thyroxine Index: 1.6 (ref 1.2–4.9)
T3 Uptake Ratio: 26 % (ref 24–39)
T4, Total: 6.1 ug/dL (ref 4.5–12.0)
TSH: 3.23 u[IU]/mL (ref 0.450–4.500)

## 2018-08-22 ENCOUNTER — Ambulatory Visit: Payer: BC Managed Care – PPO | Admitting: Nurse Practitioner

## 2018-08-25 ENCOUNTER — Other Ambulatory Visit: Payer: Self-pay | Admitting: Orthopedic Surgery

## 2018-08-25 ENCOUNTER — Encounter: Payer: Self-pay | Admitting: Nurse Practitioner

## 2018-08-25 ENCOUNTER — Other Ambulatory Visit: Payer: Self-pay

## 2018-08-25 ENCOUNTER — Ambulatory Visit: Payer: BC Managed Care – PPO | Admitting: Nurse Practitioner

## 2018-08-25 VITALS — BP 128/83 | HR 84 | Temp 98.6°F

## 2018-08-25 DIAGNOSIS — Z01818 Encounter for other preprocedural examination: Secondary | ICD-10-CM | POA: Diagnosis not present

## 2018-08-25 LAB — COAGUCHEK XS/INR WAIVED
INR: 1 (ref 0.9–1.1)
Prothrombin Time: 12.5 s

## 2018-08-25 MED ORDER — CLINDAMYCIN PHOSPHATE 600 MG/50ML IV SOLN
600.0000 mg | Freq: Once | INTRAVENOUS | Status: AC
  Administered 2018-09-18: 900 mg via INTRAVENOUS
  Filled 2018-08-25: qty 50

## 2018-08-25 NOTE — Progress Notes (Signed)
BP 128/83   Pulse 84   Temp 98.6 F (37 C) (Oral)   LMP 12/24/2012 (Exact Date)   SpO2 96%    Subjective:    Patient ID: Jessica Brennan, female    DOB: 28-Oct-1965, 53 y.o.   MRN: [id number]  HPI: Jessica Brennan is a 53 y.o. female  Chief Complaint  Patient presents with  . Surgery Clearance   PREOPERATIVE CLEARANCE: Having knee replacement of right knee on 09/18/2018.  Having performed by Dr. Mack Guise from [facility].  Is going to have both knees replaced, but not at the time same time.  Is bone on bone in both, but right is worse.  All labs performed in office today and EKG.  She has no concerns today.    Relevant past medical, surgical, family and social history reviewed and updated as indicated. Interim medical history since our last visit reviewed. Allergies and medications reviewed and updated.  Review of Systems  Constitutional: Negative for activity change, appetite change, diaphoresis, fatigue and fever.  Respiratory: Negative for cough, chest tightness and shortness of breath.   Cardiovascular: Negative for chest pain, palpitations and leg swelling.  Gastrointestinal: Negative for abdominal distention, abdominal pain, constipation, diarrhea, nausea and vomiting.  Endocrine: Negative for cold intolerance, heat intolerance, polydipsia, polyphagia and polyuria.  Neurological: Negative for dizziness, syncope, weakness, light-headedness, numbness and headaches.  Psychiatric/Behavioral: Negative.     Per HPI unless specifically indicated above     Objective:    BP 128/83   Pulse 84   Temp 98.6 F (37 C) (Oral)   LMP 12/24/2012 (Exact Date)   SpO2 96%   Wt Readings from Last 3 Encounters:  05/14/18 243 lb (110.2 kg)  10/17/17 252 lb 12.8 oz (114.7 kg)  06/03/17 245 lb (111.1 kg)    Physical Exam Vitals signs and nursing note reviewed.  Constitutional:      General: She is awake. She is not in acute distress.    Appearance: She is well-developed. She is  obese. She is not ill-appearing.  HENT:     Head: Normocephalic.     Right Ear: Hearing normal.     Left Ear: Hearing normal.     Nose: Nose normal.     Mouth/Throat:     Mouth: Mucous membranes are moist.  Eyes:     General: Lids are normal.        Right eye: No discharge.        Left eye: No discharge.     Conjunctiva/sclera: Conjunctivae normal.     Pupils: Pupils are equal, round, and reactive to light.  Neck:     Musculoskeletal: Normal range of motion and neck supple.     Thyroid: No thyromegaly.     Vascular: No carotid bruit or JVD.  Cardiovascular:     Rate and Rhythm: Normal rate and regular rhythm.     Heart sounds: Normal heart sounds. No murmur. No gallop.   Pulmonary:     Effort: Pulmonary effort is normal. No accessory muscle usage or respiratory distress.     Breath sounds: Normal breath sounds.  Abdominal:     General: Bowel sounds are normal.     Palpations: Abdomen is soft. There is no hepatomegaly or splenomegaly.  Musculoskeletal:     Right lower leg: No edema.     Left lower leg: No edema.  Lymphadenopathy:     Cervical: No cervical adenopathy.  Skin:    General: Skin is warm and dry.  Neurological:  Mental Status: She is alert and oriented to person, place, and time.  Psychiatric:        Attention and Perception: Attention normal.        Mood and Affect: Mood normal.        Behavior: Behavior normal. Behavior is cooperative.        Thought Content: Thought content normal.        Judgment: Judgment normal.    EKG in office with normal sinus rhythm (HR 74) and normal axis, some artifact due to patient being cold during procedure.  Results for orders placed or performed in visit on 08/19/18  Bayer DCA Hb A1c Waived  Result Value Ref Range   HB A1C (BAYER DCA - WAIVED) 5.7 <7.0 %  Thyroid Panel With TSH  Result Value Ref Range   TSH 3.230 0.450 - 4.500 uIU/mL   T4, Total 6.1 4.5 - 12.0 ug/dL   T3 Uptake Ratio 26 24 - 39 %   Free Thyroxine  Index 1.6 1.2 - 4.9  Lipid Panel Piccolo, Waived  Result Value Ref Range   Cholesterol Piccolo, Waived 132 <200 mg/dL   HDL Chol Piccolo, Waived 37 (L) >59 mg/dL   Triglycerides Piccolo,Waived 174 (H) <150 mg/dL   Chol/HDL Ratio Piccolo,Waive 3.6 mg/dL   LDL Chol Calc Piccolo Waived 60 <100 mg/dL   VLDL Chol Calc Piccolo,Waive 35 (H) <30 mg/dL  Comprehensive metabolic panel  Result Value Ref Range   Glucose 108 (H) 65 - 99 mg/dL   BUN 12 6 - 24 mg/dL   Creatinine, Ser 6.960.63 0.57 - 1.00 mg/dL   GFR calc non Af Amer 101 >59 mL/min/1.73   GFR calc Af Amer 117 >59 mL/min/1.73   BUN/Creatinine Ratio 19 9 - 23   Sodium 138 134 - 144 mmol/L   Potassium 4.8 3.5 - 5.2 mmol/L   Chloride 102 96 - 106 mmol/L   CO2 21 20 - 29 mmol/L   Calcium 9.3 8.7 - 10.2 mg/dL   Total Protein 7.0 6.0 - 8.5 g/dL   Albumin 4.2 3.8 - 4.9 g/dL   Globulin, Total 2.8 1.5 - 4.5 g/dL   Albumin/Globulin Ratio 1.5 1.2 - 2.2   Bilirubin Total 0.2 0.0 - 1.2 mg/dL   Alkaline Phosphatase 84 39 - 117 IU/L   AST 17 0 - 40 IU/L   ALT 19 0 - 32 IU/L      Assessment & Plan:   Problem List Items Addressed This Visit    None    Visit Diagnoses    Preoperative clearance    -  Primary   EKG, INR/PT, CBC, CMP, A1C obtained.  A1C 5.6%.  Overall physical exam WNL, dependent on labs will proceed with clearance.     Relevant Orders   EKG 12-Lead (Completed)   CBC with Differential/Platelet   Comprehensive metabolic panel   UA/M w/rflx Culture, Routine   Bayer DCA Hb A1c Waived   Lipid Panel w/o Chol/HDL Ratio   CoaguChek XS/INR Waived       Follow up plan: Return if symptoms worsen or fail to improve.

## 2018-08-25 NOTE — Patient Instructions (Signed)
Partial Knee Replacement, Care After This sheet gives you information about how to care for yourself after your procedure. Your health care provider may also give you more specific instructions. If you have problems or questions, contact your health care provider. What can I expect after the procedure? After the procedure, it is common to have:  Pain. This can be controlled with pain medicine.  Soreness.  Stiffness.  Swelling.  Bruising. Follow these instructions at home: Medicines  Take over-the-counter and prescription medicines only as told by your health care provider.  If you were prescribed a blood thinner (anticoagulant), take it as told by your health care provider.  Ask your health care provider if the medicine prescribed to you: ? Requires you to avoid driving or using heavy machinery. ? Can cause constipation. You may need to take these actions to prevent or treat constipation:  Drink enough fluid to keep your urine pale yellow.  Take over-the-counter or prescription medicines.  Eat foods that are high in fiber, such as beans, whole grains, and fresh fruits and vegetables.  Limit foods that are high in fat and processed sugars, such as fried or sweet foods. Bathing  Do not take baths, swim, or use a hot tub until your health care provider approves. Ask your health care provider if you may take showers.  Keep your bandage (dressing) dry until your health care provider says it can be removed. If the dressing is not waterproof, cover it with a waterproof covering when you take a shower. Incision care   Follow instructions from your health care provider about how to take care of your incision. Make sure you: ? Wash your hands with soap and water before and after you change your dressing. If soap and water are not available, use hand sanitizer. ? Change your dressing as told by your health care provider. ? Leave stitches (sutures), skin glue, or adhesive strips in  place. These skin closures may need to stay in place for 2 weeks or longer. If adhesive strip edges start to loosen and curl up, you may trim the loose edges. Do not remove adhesive strips completely unless your health care provider tells you to do that.  Check your incision area every day for signs of infection. Check for: ? More redness, swelling, or pain. ? More fluid or blood. ? Warmth. ? Pus or a bad smell. Managing pain, stiffness, and swelling      If directed, put ice on the affected area. ? Put ice in a plastic bag or use the icing device (cold flow pad or cryocuff) that you were given. Follow instructions from your health care provider about how to use the icing device. ? Place a towel between your skin and the bag or between your skin and the icing device. ? Leave the ice on for 20 minutes, 2-3 times a day.  If directed, apply heat to the affected area before you exercise. Use the heat source that your health care provider recommends, such as a moist heat pack or a heating pad. ? Place a towel between your skin and the heat source. ? Leave the heat on for 20-30 minutes. ? Remove the heat if your skin turns bright red. This is especially important if you are unable to feel pain, heat, or cold. You may have a greater risk of getting burned.  Move your toes often to reduce stiffness and swelling.  Raise (elevate) your knee above the level of your heart while you are  sitting or lying down. ? Use several pillows to keep your leg straight. ? Do not put a pillow just under the knee. If the knee is bent for a long time, this may lead to stiffness.  Wear elastic knee support as told by your health care provider. Activity   Use your walker, cane, or crutches to walk. You can put some weight on your knee. You should be able to walk without assistance after several days or a few weeks.  Do your knee exercises as instructed by your physical therapist.  Ask your health care provider  when it is safe to drive.  Return to your normal activities as told by your health care provider. Ask your health care provider what activities are safe for you. Returning to all your usual activities may take about 6 weeks. General instructions  Wear compression stockings as told by your health care provider.  Tell your health care provider if you plan to have dental work. Also, tell your dentist about your joint replacement. ? Ask your health care provider if there are any special instructions you need to follow before having dental care and routine cleanings.  Do not use any products that contain nicotine or tobacco, such as cigarettes, e-cigarettes, and chewing tobacco. If you need help quitting, ask your health care provider.  Keep all follow-up visits with your health care provider and your physical therapist. This is important. Physical therapy may continue for several months after surgery. Contact a health care provider if you:  Have chills or a fever.  Have pain that is not controlled by your pain medicine.  Are unable to put weight on your knee.  Have any signs of infection when you check your incision. Get help right away if you:  Develop a warm, tender, red, or swollen area in your leg.  Have chest pain or trouble breathing. Summary  After the procedure, it is common to have pain, soreness, and stiffness.  Follow instructions from your health care provider about how to take care of your incision. Check your incision area every day for signs of infection.  Use your walker, cane, or crutches to walk as told by your health care provider. You may have to do this for the first few days or weeks. You can put some weight on your knee as told by your health care provider.  Return to your normal activities as told by your health care provider. Ask your health care provider what activities are safe for you. Returning to all your usual activities may take about 6 weeks.  Call  your health care provider if your pain medicine is not helping or if you have any signs of infection. This information is not intended to replace advice given to you by your health care provider. Make sure you discuss any questions you have with your health care provider. Document Released: 03/05/2018 Document Revised: 03/05/2018 Document Reviewed: 03/05/2018 Elsevier Patient Education  2020 [employer]

## 2018-08-26 ENCOUNTER — Telehealth: Payer: Self-pay | Admitting: Nurse Practitioner

## 2018-08-26 DIAGNOSIS — N3000 Acute cystitis without hematuria: Secondary | ICD-10-CM

## 2018-08-26 LAB — CBC WITH DIFFERENTIAL/PLATELET
Basophils Absolute: 0 10*3/uL (ref 0.0–0.2)
Basos: 0 %
EOS (ABSOLUTE): 0.2 10*3/uL (ref 0.0–0.4)
Eos: 2 %
Hematocrit: 42.3 % (ref 34.0–46.6)
Hemoglobin: 14.3 g/dL (ref 11.1–15.9)
Immature Grans (Abs): 0 10*3/uL (ref 0.0–0.1)
Immature Granulocytes: 0 %
Lymphocytes Absolute: 2.6 10*3/uL (ref 0.7–3.1)
Lymphs: 23 %
MCH: 29.4 pg (ref 26.6–33.0)
MCHC: 33.8 g/dL (ref 31.5–35.7)
MCV: 87 fL (ref 79–97)
Monocytes Absolute: 0.7 10*3/uL (ref 0.1–0.9)
Monocytes: 6 %
Neutrophils Absolute: 7.5 10*3/uL — ABNORMAL HIGH (ref 1.4–7.0)
Neutrophils: 69 %
Platelets: 328 10*3/uL (ref 150–450)
RBC: 4.86 x10E6/uL (ref 3.77–5.28)
RDW: 13.3 % (ref 11.7–15.4)
WBC: 11.1 10*3/uL — ABNORMAL HIGH (ref 3.4–10.8)

## 2018-08-26 LAB — LIPID PANEL W/O CHOL/HDL RATIO
Cholesterol, Total: 248 mg/dL — ABNORMAL HIGH (ref 100–199)
HDL: 103 mg/dL (ref >39)
LDL Calculated: 129 mg/dL — ABNORMAL HIGH (ref 0–99)
Triglycerides: 82 mg/dL (ref 0–149)
VLDL Cholesterol Cal: 16 mg/dL (ref 5–40)

## 2018-08-26 LAB — COMPREHENSIVE METABOLIC PANEL
ALT: 27 IU/L (ref 0–32)
AST: 22 IU/L (ref 0–40)
Albumin/Globulin Ratio: 1.6 (ref 1.2–2.2)
Albumin: 4.3 g/dL (ref 3.8–4.9)
Alkaline Phosphatase: 91 IU/L (ref 39–117)
BUN/Creatinine Ratio: 14 (ref 9–23)
BUN: 10 mg/dL (ref 6–24)
Bilirubin Total: 0.4 mg/dL (ref 0.0–1.2)
CO2: 24 mmol/L (ref 20–29)
Calcium: 9.4 mg/dL (ref 8.7–10.2)
Chloride: 97 mmol/L (ref 96–106)
Creatinine, Ser: 0.71 mg/dL (ref 0.57–1.00)
GFR calc Af Amer: 111 mL/min/{1.73_m2} (ref >59)
GFR calc non Af Amer: 96 mL/min/{1.73_m2} (ref >59)
Globulin, Total: 2.7 g/dL (ref 1.5–4.5)
Glucose: 100 mg/dL — ABNORMAL HIGH (ref 65–99)
Potassium: 4.6 mmol/L (ref 3.5–5.2)
Sodium: 137 mmol/L (ref 134–144)
Total Protein: 7 g/dL (ref 6.0–8.5)

## 2018-08-26 MED ORDER — NITROFURANTOIN MONOHYD MACRO 100 MG PO CAPS: 100.0000 mg | ORAL_CAPSULE | Freq: Two times a day (BID) | ORAL | 0 refills | Status: AC

## 2018-08-26 NOTE — Telephone Encounter (Signed)
Spoke to patient on phone.

## 2018-08-26 NOTE — Telephone Encounter (Signed)
Spoke to patient on telephone and reviewed urine from yesterday and WBC 11.1 on labs.  Will send in script for Macrobid for UTI to take x 5 days and then return to office for repeat urine and CBC as she has upcoming surgery.

## 2018-08-26 NOTE — Telephone Encounter (Signed)
Pt called in stating that she missed a call from the office after her appt yesterday. Wanting to know if provider called? Please advise.

## 2018-08-27 LAB — UA/M W/RFLX CULTURE, ROUTINE
Bilirubin, UA: NEGATIVE
Glucose, UA: NEGATIVE
Ketones, UA: NEGATIVE
Leukocytes,UA: NEGATIVE
Nitrite, UA: POSITIVE — AB
Protein,UA: NEGATIVE
RBC, UA: NEGATIVE
Specific Gravity, UA: <1.005 — ABNORMAL LOW (ref 1.005–1.030)
Urobilinogen, Ur: 0.2 mg/dL (ref 0.2–1.0)
pH, UA: 5.5 (ref 5.0–7.5)

## 2018-08-27 LAB — MICROSCOPIC EXAMINATION: RBC, Urine: NONE SEEN /hpf (ref 0–2)

## 2018-08-27 LAB — URINE CULTURE, REFLEX

## 2018-08-27 LAB — BAYER DCA HB A1C WAIVED: HB A1C (BAYER DCA - WAIVED): 5.6 % (ref <7.0)

## 2018-09-04 ENCOUNTER — Other Ambulatory Visit: Payer: BC Managed Care – PPO

## 2018-09-04 ENCOUNTER — Other Ambulatory Visit: Payer: Self-pay

## 2018-09-04 DIAGNOSIS — N3000 Acute cystitis without hematuria: Secondary | ICD-10-CM

## 2018-09-06 LAB — UA/M W/RFLX CULTURE, ROUTINE
Bilirubin, UA: NEGATIVE
Glucose, UA: NEGATIVE
Leukocytes,UA: NEGATIVE
Nitrite, UA: NEGATIVE
RBC, UA: NEGATIVE
Specific Gravity, UA: 1.02 (ref 1.005–1.030)
Urobilinogen, Ur: 1 mg/dL (ref 0.2–1.0)
pH, UA: 5.5 (ref 5.0–7.5)

## 2018-09-06 LAB — MICROSCOPIC EXAMINATION: RBC, Urine: NONE SEEN /hpf (ref 0–2)

## 2018-09-06 LAB — URINE CULTURE, REFLEX

## 2018-09-12 ENCOUNTER — Telehealth: Payer: Self-pay | Admitting: Nurse Practitioner

## 2018-09-12 NOTE — Telephone Encounter (Signed)
Will see if patient can be seen for virtual visit next week and discuss.

## 2018-09-12 NOTE — Telephone Encounter (Signed)
Copied from [place] [id number]. 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 Pt asked that her pcp call to let her know if she will approve for her to work virtually.

## 2018-09-15 ENCOUNTER — Other Ambulatory Visit: Payer: Self-pay | Admitting: Unknown Physician Specialty

## 2018-09-15 ENCOUNTER — Other Ambulatory Visit: Payer: Self-pay

## 2018-09-15 ENCOUNTER — Encounter
Admission: RE | Admit: 2018-09-15 | Discharge: 2018-09-15 | Disposition: A | Discharge status: Home / Self Care | Payer: BC Managed Care – PPO | Source: Ambulatory Visit | Attending: Orthopedic Surgery | Admitting: Orthopedic Surgery

## 2018-09-15 ENCOUNTER — Ambulatory Visit
Admission: RE | Admit: 2018-09-15 | Discharge: 2018-09-15 | Disposition: A | Discharge status: Home / Self Care | Payer: BC Managed Care – PPO | Source: Ambulatory Visit | Attending: Orthopedic Surgery | Admitting: Orthopedic Surgery

## 2018-09-15 DIAGNOSIS — Z01818 Encounter for other preprocedural examination: Secondary | ICD-10-CM | POA: Insufficient documentation

## 2018-09-15 DIAGNOSIS — I10 Essential (primary) hypertension: Secondary | ICD-10-CM

## 2018-09-15 DIAGNOSIS — M1711 Unilateral primary osteoarthritis, right knee: Secondary | ICD-10-CM | POA: Insufficient documentation

## 2018-09-15 DIAGNOSIS — Z1159 Encounter for screening for other viral diseases: Secondary | ICD-10-CM | POA: Insufficient documentation

## 2018-09-15 HISTORY — DX: Essential (primary) hypertension: I10

## 2018-09-15 LAB — URINALYSIS, ROUTINE W REFLEX MICROSCOPIC
Glucose, UA: NEGATIVE mg/dL
Ketones, ur: 15 mg/dL — AB
Leukocytes,Ua: NEGATIVE
Nitrite: NEGATIVE
Protein, ur: 30 mg/dL — AB
Specific Gravity, Urine: 1.025 (ref 1.005–1.030)
pH: 5.5 (ref 5.0–8.0)

## 2018-09-15 LAB — TYPE AND SCREEN
ABO/RH(D): A POS
Antibody Screen: NEGATIVE

## 2018-09-15 LAB — PROTIME-INR
INR: 1 (ref 0.8–1.2)
Prothrombin Time: 13 seconds (ref 11.4–15.2)

## 2018-09-15 LAB — CBC WITH DIFFERENTIAL/PLATELET
Abs Immature Granulocytes: 0.06 10*3/uL (ref 0.00–0.07)
Basophils Absolute: 0.1 10*3/uL (ref 0.0–0.1)
Basophils Relative: 1 %
Eosinophils Absolute: 0.1 10*3/uL (ref 0.0–0.5)
Eosinophils Relative: 1 %
HCT: 44.5 % (ref 36.0–46.0)
Hemoglobin: 14.6 g/dL (ref 12.0–15.0)
Immature Granulocytes: 0 %
Lymphocytes Relative: 20 %
Lymphs Abs: 2.9 10*3/uL (ref 0.7–4.0)
MCH: 29.4 pg (ref 26.0–34.0)
MCHC: 32.8 g/dL (ref 30.0–36.0)
MCV: 89.7 fL (ref 80.0–100.0)
Monocytes Absolute: 0.9 10*3/uL (ref 0.1–1.0)
Monocytes Relative: 6 %
Neutro Abs: 10.4 10*3/uL — ABNORMAL HIGH (ref 1.7–7.7)
Neutrophils Relative %: 72 %
Platelets: 287 10*3/uL (ref 150–400)
RBC: 4.96 MIL/uL (ref 3.87–5.11)
RDW: 14.4 % (ref 11.5–15.5)
WBC: 14.4 10*3/uL — ABNORMAL HIGH (ref 4.0–10.5)
nRBC: 0 % (ref 0.0–0.2)

## 2018-09-15 LAB — BASIC METABOLIC PANEL
Anion gap: 13 (ref 5–15)
BUN: 15 mg/dL (ref 6–20)
CO2: 23 mmol/L (ref 22–32)
Calcium: 9.8 mg/dL (ref 8.9–10.3)
Chloride: 105 mmol/L (ref 98–111)
Creatinine, Ser: 0.78 mg/dL (ref 0.44–1.00)
GFR calc Af Amer: >60 mL/min (ref >60)
GFR calc non Af Amer: >60 mL/min (ref >60)
Glucose, Bld: 124 mg/dL — ABNORMAL HIGH (ref 70–99)
Potassium: 3.9 mmol/L (ref 3.5–5.1)
Sodium: 141 mmol/L (ref 135–145)

## 2018-09-15 LAB — URINALYSIS, MICROSCOPIC (REFLEX): Bacteria, UA: NONE SEEN

## 2018-09-15 LAB — HEMOGLOBIN A1C
Hgb A1c MFr Bld: 5.6 % (ref 4.8–5.6)
Mean Plasma Glucose: 114.02 mg/dL

## 2018-09-15 LAB — SURGICAL PCR SCREEN
MRSA, PCR: NEGATIVE
Staphylococcus aureus: NEGATIVE

## 2018-09-15 LAB — APTT: aPTT: 27 seconds (ref 24–36)

## 2018-09-15 MED ORDER — CHLORHEXIDINE GLUCONATE CLOTH 2 % EX PADS
6.0000 | MEDICATED_PAD | Freq: Once | CUTANEOUS | Status: DC
  Filled 2018-09-15: qty 6

## 2018-09-15 NOTE — Telephone Encounter (Signed)
Thank you.  Noted.  If she calls back would like virtual visit.

## 2018-09-15 NOTE — Patient Instructions (Signed)
Your procedure is scheduled on: 09/18/2018 Thur Report to Same Day Surgery 2nd floor medical [facility] [place](Medical Mall Entrance-take elevator on left to 2nd floor.  Check in with surgery information desk.) To find out your arrival time please call [phone] between 1PM - 3PM on 09/17/2018 Wed  Remember: Instructions that are not followed completely may result in serious medical risk, up to and including death, or upon the discretion of your surgeon and anesthesiologist your surgery may need to be rescheduled.    _x___ 1. Do not eat food after midnight the night before your procedure. You may drink clear liquids up to 2 hours before you are scheduled to arrive at the hospital for your procedure.  Do not drink clear liquids within 2 hours of your scheduled arrival to the hospital.  Clear liquids include  --Water or Apple juice without pulp  --Clear carbohydrate beverage such as ClearFast or Gatorade  --Black Coffee or Clear Tea (No milk, no creamers, do not add anything to                  the coffee or Tea Type 1 and type 2 diabetics should only drink water.   ____Ensure clear carbohydrate drink on the way to the hospital for bariatric patients  ____Ensure clear carbohydrate drink 3 hours before surgery.   No gum chewing or hard candies.     __x__ 2. No Alcohol for 24 hours before or after surgery.   __x__3. No Smoking or e-cigarettes for 24 prior to surgery.  Do not use any chewable tobacco products for at least 6 hour prior to surgery   ____  4. Bring all medications with you on the day of surgery if instructed.    __x__ 5. Notify your doctor if there is any change in your medical condition     (cold, fever, infections).    x___6. On the morning of surgery brush your teeth with toothpaste and water.  You may rinse your mouth with mouth wash if you wish.  Do not swallow any toothpaste or mouthwash.   Do not wear jewelry, make-up, hairpins, clips or nail polish.  Do not wear lotions,  powders, or perfumes. You may wear deodorant.  Do not shave 48 hours prior to surgery. Men may shave face and neck.  Do not bring valuables to the hospital.    [facility] not responsible for any belongings or valuables.               Contacts, dentures or bridgework may not be worn into surgery.  Leave your suitcase in the car. After surgery it may be brought to your room.  For patients admitted to the hospital, discharge time is determined by your                       treatment team.  _  Patients discharged the day of surgery will not be allowed to drive home.  You will need someone to drive you home and stay with you the night of your procedure.    Please read over the following fact sheets that you were given:   [facility] Preparing for [facility] and or MRSA Information   _x___ Take anti-hypertensive listed below, cardiac, seizure, asthma,     anti-reflux and psychiatric medicines. These include:  1. albuterol (PROVENTIL HFA;VENTOLIN HFA) 108 (90 Base) MCG/ACT inhaler use am of surgery and bring to hospital  2.amLODipine (NORVASC) 5 MG tablet  3.levothyroxine (SYNTHROID)  75 MCG tablet  4.sertraline (ZOLOFT) 100 MG tablet  5.venlafaxine XR (EFFEXOR-XR) 75 MG 24 hr capsule  6.  ____Fleets enema or Magnesium Citrate as directed.   _x___ Use CHG Soap or sage wipes as directed on instruction sheet   __x__ Use inhalers on the day of surgery and bring to hospital day of surgery  ____ Stop Metformin and Janumet 2 days prior to surgery.    ____ Take 1/2 of usual insulin dose the night before surgery and none on the morning     surgery.   _x___ Follow recommendations from Cardiologist, Pulmonologist or PCP regarding          stopping Aspirin, Coumadin, Plavix ,Eliquis, Effient, or Pradaxa, and Pletal.  X____Stop Anti-inflammatories such as Advil, Aleve, Ibuprofen, Motrin, Naproxen, Naprosyn, Goodies powders or aspirin products. OK to take Tylenol and                           Celebrex.   _x___ Stop supplements until after surgery.  But may continue Vitamin D, Vitamin B,       and multivitamin.   ____ Bring C-Pap to the hospital.

## 2018-09-15 NOTE — Telephone Encounter (Signed)
Called pt to set up appt no answer, left vm to call back to schedule

## 2018-09-16 LAB — SARS CORONAVIRUS 2 (TAT 6-24 HRS): SARS Coronavirus 2: NEGATIVE

## 2018-09-17 MED ORDER — CEFAZOLIN SODIUM-DEXTROSE 2-4 GM/100ML-% IV SOLN
2.0000 g | INTRAVENOUS | Status: AC
  Administered 2018-09-18: 2 g via INTRAVENOUS

## 2018-09-18 ENCOUNTER — Encounter
Admission: RE | Disposition: A | Discharge status: Home / Self Care | Payer: Self-pay | Source: Home / Self Care | Attending: Orthopedic Surgery

## 2018-09-18 ENCOUNTER — Inpatient Hospital Stay: Payer: BC Managed Care – PPO | Admitting: Anesthesiology

## 2018-09-18 ENCOUNTER — Inpatient Hospital Stay
Admission: RE | Admit: 2018-09-18 | Discharge: 2018-09-20 | DRG: 470 | Disposition: A | Discharge status: Home / Self Care | Payer: BC Managed Care – PPO | Attending: Orthopedic Surgery | Admitting: Orthopedic Surgery

## 2018-09-18 ENCOUNTER — Other Ambulatory Visit: Payer: Self-pay

## 2018-09-18 ENCOUNTER — Encounter: Payer: Self-pay | Admitting: *Deleted

## 2018-09-18 ENCOUNTER — Inpatient Hospital Stay: Payer: BC Managed Care – PPO

## 2018-09-18 DIAGNOSIS — I10 Essential (primary) hypertension: Secondary | ICD-10-CM | POA: Diagnosis present

## 2018-09-18 DIAGNOSIS — Z90710 Acquired absence of both cervix and uterus: Secondary | ICD-10-CM | POA: Diagnosis not present

## 2018-09-18 DIAGNOSIS — Z79899 Other long term (current) drug therapy: Secondary | ICD-10-CM

## 2018-09-18 DIAGNOSIS — Z885 Allergy status to narcotic agent status: Secondary | ICD-10-CM | POA: Diagnosis not present

## 2018-09-18 DIAGNOSIS — Z6841 Body Mass Index (BMI) 40.0 and over, adult: Secondary | ICD-10-CM | POA: Diagnosis not present

## 2018-09-18 DIAGNOSIS — Z780 Asymptomatic menopausal state: Secondary | ICD-10-CM

## 2018-09-18 DIAGNOSIS — M1711 Unilateral primary osteoarthritis, right knee: Principal | ICD-10-CM | POA: Diagnosis present

## 2018-09-18 DIAGNOSIS — E039 Hypothyroidism, unspecified: Secondary | ICD-10-CM | POA: Diagnosis present

## 2018-09-18 DIAGNOSIS — Z9109 Other allergy status, other than to drugs and biological substances: Secondary | ICD-10-CM

## 2018-09-18 DIAGNOSIS — M25561 Pain in right knee: Secondary | ICD-10-CM | POA: Diagnosis present

## 2018-09-18 DIAGNOSIS — Z96651 Presence of right artificial knee joint: Secondary | ICD-10-CM

## 2018-09-18 DIAGNOSIS — Z8261 Family history of arthritis: Secondary | ICD-10-CM | POA: Diagnosis not present

## 2018-09-18 DIAGNOSIS — J45909 Unspecified asthma, uncomplicated: Secondary | ICD-10-CM | POA: Diagnosis present

## 2018-09-18 DIAGNOSIS — F341 Dysthymic disorder: Secondary | ICD-10-CM | POA: Diagnosis present

## 2018-09-18 DIAGNOSIS — Z79890 Hormone replacement therapy (postmenopausal): Secondary | ICD-10-CM

## 2018-09-18 DIAGNOSIS — Z8249 Family history of ischemic heart disease and other diseases of the circulatory system: Secondary | ICD-10-CM | POA: Diagnosis not present

## 2018-09-18 DIAGNOSIS — Z515 Encounter for palliative care: Secondary | ICD-10-CM

## 2018-09-18 DIAGNOSIS — F419 Anxiety disorder, unspecified: Secondary | ICD-10-CM | POA: Diagnosis present

## 2018-09-18 HISTORY — PX: TOTAL KNEE ARTHROPLASTY: SHX125

## 2018-09-18 LAB — ABO/RH: ABO/RH(D): A POS

## 2018-09-18 SURGERY — ARTHROPLASTY, KNEE, TOTAL
Anesthesia: Spinal | Laterality: Right

## 2018-09-18 MED ORDER — MIDAZOLAM HCL 2 MG/2ML IJ SOLN
INTRAMUSCULAR | Status: AC
  Filled 2018-09-18: qty 2

## 2018-09-18 MED ORDER — CLINDAMYCIN PHOSPHATE 600 MG/50ML IV SOLN
600.0000 mg | Freq: Three times a day (TID) | INTRAVENOUS | Status: AC
  Administered 2018-09-18 – 2018-09-19 (×2): 600 mg via INTRAVENOUS
  Filled 2018-09-18 (×2): qty 50

## 2018-09-18 MED ORDER — ALBUTEROL SULFATE (2.5 MG/3ML) 0.083% IN NEBU: 2.5000 mg | INHALATION_SOLUTION | Freq: Four times a day (QID) | RESPIRATORY_TRACT | Status: DC | PRN

## 2018-09-18 MED ORDER — ALBUTEROL SULFATE HFA 108 (90 BASE) MCG/ACT IN AERS: 2.0000 | INHALATION_SPRAY | Freq: Four times a day (QID) | RESPIRATORY_TRACT | Status: DC | PRN

## 2018-09-18 MED ORDER — TRANEXAMIC ACID 1000 MG/10ML IV SOLN
INTRAVENOUS | Status: AC
  Filled 2018-09-18: qty 10

## 2018-09-18 MED ORDER — GABAPENTIN 300 MG PO CAPS
300.0000 mg | ORAL_CAPSULE | Freq: Three times a day (TID) | ORAL | Status: DC
  Administered 2018-09-18 – 2018-09-20 (×6): 300 mg via ORAL
  Filled 2018-09-18 (×6): qty 1

## 2018-09-18 MED ORDER — OXYCODONE HCL 5 MG PO TABS: 10.0000 mg | ORAL_TABLET | ORAL | Status: DC | PRN

## 2018-09-18 MED ORDER — SERTRALINE HCL 50 MG PO TABS
150.0000 mg | ORAL_TABLET | Freq: Every day | ORAL | Status: DC
  Administered 2018-09-19 – 2018-09-20 (×2): 150 mg via ORAL
  Filled 2018-09-18 (×2): qty 3

## 2018-09-18 MED ORDER — MAGNESIUM CITRATE PO SOLN
1.0000 | Freq: Once | ORAL | Status: DC | PRN
  Filled 2018-09-18: qty 296

## 2018-09-18 MED ORDER — PROPOFOL 10 MG/ML IV BOLUS
INTRAVENOUS | Status: AC
  Filled 2018-09-18: qty 20

## 2018-09-18 MED ORDER — BUPIVACAINE-EPINEPHRINE (PF) 0.25% -1:200000 IJ SOLN
INTRAMUSCULAR | Status: AC
  Filled 2018-09-18: qty 60

## 2018-09-18 MED ORDER — POLYETHYLENE GLYCOL 3350 17 G PO PACK
17.0000 g | PACK | Freq: Every day | ORAL | Status: DC | PRN
  Administered 2018-09-19: 17 g via ORAL
  Filled 2018-09-18: qty 1

## 2018-09-18 MED ORDER — MIDAZOLAM HCL 5 MG/5ML IJ SOLN
INTRAMUSCULAR | Status: DC | PRN
  Administered 2018-09-18: 2 mg via INTRAVENOUS

## 2018-09-18 MED ORDER — HYDROMORPHONE HCL 1 MG/ML IJ SOLN
INTRAMUSCULAR | Status: DC | PRN
  Administered 2018-09-18: 0.5 mg via INTRAVENOUS

## 2018-09-18 MED ORDER — DEXMEDETOMIDINE HCL 200 MCG/2ML IV SOLN
INTRAVENOUS | Status: DC | PRN
  Administered 2018-09-18: 16 ug via INTRAVENOUS

## 2018-09-18 MED ORDER — PROPOFOL 500 MG/50ML IV EMUL
INTRAVENOUS | Status: AC
  Filled 2018-09-18: qty 50

## 2018-09-18 MED ORDER — SODIUM CHLORIDE 0.9 % IV SOLN
INTRAVENOUS | Status: DC | PRN
  Administered 2018-09-18: 60 mL

## 2018-09-18 MED ORDER — PROPOFOL 10 MG/ML IV BOLUS
INTRAVENOUS | Status: DC | PRN
  Administered 2018-09-18: 20 mg via INTRAVENOUS
  Administered 2018-09-18: 50 mg via INTRAVENOUS
  Administered 2018-09-18: 10 mg via INTRAVENOUS
  Administered 2018-09-18: 200 mg via INTRAVENOUS
  Administered 2018-09-18: 10 mg via INTRAVENOUS

## 2018-09-18 MED ORDER — OXYCODONE HCL 5 MG PO TABS
5.0000 mg | ORAL_TABLET | ORAL | Status: DC | PRN
  Administered 2018-09-18 – 2018-09-19 (×3): 5 mg via ORAL
  Administered 2018-09-20: 10 mg via ORAL
  Filled 2018-09-18 (×2): qty 1
  Filled 2018-09-18: qty 2
  Filled 2018-09-18: qty 1

## 2018-09-18 MED ORDER — SODIUM CHLORIDE 0.9 % IV SOLN
INTRAVENOUS | Status: DC
  Administered 2018-09-18: 15:00:00 via INTRAVENOUS

## 2018-09-18 MED ORDER — PHENOL 1.4 % MT LIQD
1.0000 | OROMUCOSAL | Status: DC | PRN
  Filled 2018-09-18: qty 177

## 2018-09-18 MED ORDER — FENTANYL CITRATE (PF) 100 MCG/2ML IJ SOLN
25.0000 ug | INTRAMUSCULAR | Status: AC | PRN
  Administered 2018-09-18 (×6): 25 ug via INTRAVENOUS

## 2018-09-18 MED ORDER — ATORVASTATIN CALCIUM 20 MG PO TABS
20.0000 mg | ORAL_TABLET | Freq: Every day | ORAL | Status: DC
  Administered 2018-09-18 – 2018-09-19 (×2): 20 mg via ORAL
  Filled 2018-09-18 (×2): qty 1

## 2018-09-18 MED ORDER — SODIUM CHLORIDE FLUSH 0.9 % IV SOLN
INTRAVENOUS | Status: AC
  Filled 2018-09-18: qty 60

## 2018-09-18 MED ORDER — METHOCARBAMOL 1000 MG/10ML IJ SOLN
500.0000 mg | Freq: Four times a day (QID) | INTRAVENOUS | Status: DC | PRN
  Filled 2018-09-18: qty 5

## 2018-09-18 MED ORDER — NEOMYCIN-POLYMYXIN B GU 40-200000 IR SOLN
Status: DC | PRN
  Administered 2018-09-18: 16 mL

## 2018-09-18 MED ORDER — HYDROMORPHONE HCL 1 MG/ML IJ SOLN
INTRAMUSCULAR | Status: AC
  Filled 2018-09-18: qty 1

## 2018-09-18 MED ORDER — DEXAMETHASONE SODIUM PHOSPHATE 10 MG/ML IJ SOLN
INTRAMUSCULAR | Status: DC | PRN
  Administered 2018-09-18: 5 mg via INTRAVENOUS

## 2018-09-18 MED ORDER — ACETAMINOPHEN 500 MG PO TABS
1000.0000 mg | ORAL_TABLET | Freq: Four times a day (QID) | ORAL | Status: AC
  Administered 2018-09-18 – 2018-09-19 (×4): 1000 mg via ORAL
  Filled 2018-09-18 (×4): qty 2

## 2018-09-18 MED ORDER — ALUM & MAG HYDROXIDE-SIMETH 200-200-20 MG/5ML PO SUSP: 30.0000 mL | ORAL | Status: DC | PRN

## 2018-09-18 MED ORDER — CEFAZOLIN SODIUM-DEXTROSE 1-4 GM/50ML-% IV SOLN
1.0000 g | Freq: Four times a day (QID) | INTRAVENOUS | Status: AC
  Administered 2018-09-18 (×2): 1 g via INTRAVENOUS
  Filled 2018-09-18 (×2): qty 50

## 2018-09-18 MED ORDER — FAMOTIDINE 20 MG PO TABS
ORAL_TABLET | ORAL | Status: AC
  Administered 2018-09-18: 07:00:00 20 mg via ORAL
  Filled 2018-09-18: qty 1

## 2018-09-18 MED ORDER — ONDANSETRON HCL 4 MG/2ML IJ SOLN: 4.0000 mg | Freq: Four times a day (QID) | INTRAMUSCULAR | Status: DC | PRN

## 2018-09-18 MED ORDER — KETAMINE HCL 50 MG/ML IJ SOLN
INTRAMUSCULAR | Status: AC
  Filled 2018-09-18: qty 10

## 2018-09-18 MED ORDER — FAMOTIDINE 20 MG PO TABS
20.0000 mg | ORAL_TABLET | Freq: Once | ORAL | Status: AC
  Administered 2018-09-18: 20 mg via ORAL

## 2018-09-18 MED ORDER — MENTHOL 3 MG MT LOZG
1.0000 | LOZENGE | OROMUCOSAL | Status: DC | PRN
  Filled 2018-09-18: qty 9

## 2018-09-18 MED ORDER — BISACODYL 10 MG RE SUPP: 10.0000 mg | Freq: Every day | RECTAL | Status: DC | PRN

## 2018-09-18 MED ORDER — TRAMADOL HCL 50 MG PO TABS
50.0000 mg | ORAL_TABLET | Freq: Four times a day (QID) | ORAL | Status: DC
  Administered 2018-09-18 – 2018-09-20 (×6): 50 mg via ORAL
  Filled 2018-09-18 (×6): qty 1

## 2018-09-18 MED ORDER — FENTANYL CITRATE (PF) 100 MCG/2ML IJ SOLN
INTRAMUSCULAR | Status: AC
  Filled 2018-09-18: qty 2

## 2018-09-18 MED ORDER — DOCUSATE SODIUM 100 MG PO CAPS
100.0000 mg | ORAL_CAPSULE | Freq: Two times a day (BID) | ORAL | Status: DC
  Administered 2018-09-18 – 2018-09-19 (×3): 100 mg via ORAL
  Filled 2018-09-18 (×3): qty 1

## 2018-09-18 MED ORDER — HYDROMORPHONE HCL 1 MG/ML IJ SOLN: 0.5000 mg | INTRAMUSCULAR | Status: DC | PRN

## 2018-09-18 MED ORDER — CLINDAMYCIN PHOSPHATE 900 MG/50ML IV SOLN
INTRAVENOUS | Status: AC
  Filled 2018-09-18: qty 50

## 2018-09-18 MED ORDER — ACETAMINOPHEN 10 MG/ML IV SOLN
INTRAVENOUS | Status: DC | PRN
  Administered 2018-09-18: 1000 mg via INTRAVENOUS

## 2018-09-18 MED ORDER — MORPHINE SULFATE (PF) 4 MG/ML IV SOLN
INTRAVENOUS | Status: AC
  Filled 2018-09-18: qty 1

## 2018-09-18 MED ORDER — KETOROLAC TROMETHAMINE 30 MG/ML IJ SOLN
INTRAMUSCULAR | Status: DC | PRN
  Administered 2018-09-18: 30 mg via INTRAMUSCULAR

## 2018-09-18 MED ORDER — VENLAFAXINE HCL ER 75 MG PO CP24
75.0000 mg | ORAL_CAPSULE | Freq: Every day | ORAL | Status: DC
  Administered 2018-09-19 – 2018-09-20 (×2): 75 mg via ORAL
  Filled 2018-09-18 (×2): qty 1

## 2018-09-18 MED ORDER — AMLODIPINE BESYLATE 5 MG PO TABS
5.0000 mg | ORAL_TABLET | Freq: Every day | ORAL | Status: DC
  Administered 2018-09-19: 12:00:00 5 mg via ORAL
  Filled 2018-09-18: qty 1

## 2018-09-18 MED ORDER — LORATADINE 10 MG PO TABS: 10.0000 mg | ORAL_TABLET | Freq: Every day | ORAL | Status: DC | PRN

## 2018-09-18 MED ORDER — KETAMINE HCL 50 MG/ML IJ SOLN
INTRAMUSCULAR | Status: DC | PRN
  Administered 2018-09-18: 50 mg via INTRAMUSCULAR

## 2018-09-18 MED ORDER — FENTANYL CITRATE (PF) 100 MCG/2ML IJ SOLN
INTRAMUSCULAR | Status: AC
  Administered 2018-09-18: 12:00:00 25 ug via INTRAVENOUS
  Filled 2018-09-18: qty 2

## 2018-09-18 MED ORDER — ACETAMINOPHEN 10 MG/ML IV SOLN
INTRAVENOUS | Status: AC
  Filled 2018-09-18: qty 100

## 2018-09-18 MED ORDER — ONDANSETRON HCL 4 MG PO TABS: 4.0000 mg | ORAL_TABLET | Freq: Four times a day (QID) | ORAL | Status: DC | PRN

## 2018-09-18 MED ORDER — SEVOFLURANE IN SOLN
RESPIRATORY_TRACT | Status: AC
  Filled 2018-09-18: qty 250

## 2018-09-18 MED ORDER — BUPIVACAINE-EPINEPHRINE 0.25% -1:200000 IJ SOLN
INTRAMUSCULAR | Status: DC | PRN
  Administered 2018-09-18: 60 mL

## 2018-09-18 MED ORDER — METHOCARBAMOL 500 MG PO TABS
500.0000 mg | ORAL_TABLET | Freq: Four times a day (QID) | ORAL | Status: DC | PRN
  Administered 2018-09-19: 500 mg via ORAL
  Filled 2018-09-18: qty 1

## 2018-09-18 MED ORDER — ACETAMINOPHEN 325 MG PO TABS: 325.0000 mg | ORAL_TABLET | Freq: Four times a day (QID) | ORAL | Status: DC | PRN

## 2018-09-18 MED ORDER — TRANEXAMIC ACID-NACL 1000-0.7 MG/100ML-% IV SOLN
INTRAVENOUS | Status: DC | PRN
  Administered 2018-09-18: 1000 mg via INTRAVENOUS

## 2018-09-18 MED ORDER — LEVOTHYROXINE SODIUM 50 MCG PO TABS
75.0000 ug | ORAL_TABLET | Freq: Every day | ORAL | Status: DC
  Administered 2018-09-19 – 2018-09-20 (×2): 75 ug via ORAL
  Filled 2018-09-18 (×2): qty 1

## 2018-09-18 MED ORDER — FENTANYL CITRATE (PF) 100 MCG/2ML IJ SOLN
INTRAMUSCULAR | Status: AC
  Administered 2018-09-18: 13:00:00 25 ug via INTRAVENOUS
  Filled 2018-09-18: qty 2

## 2018-09-18 MED ORDER — LACTATED RINGERS IV SOLN
INTRAVENOUS | Status: DC
  Administered 2018-09-18: 07:00:00 via INTRAVENOUS

## 2018-09-18 MED ORDER — MORPHINE SULFATE 4 MG/ML IJ SOLN
INTRAMUSCULAR | Status: DC | PRN
  Administered 2018-09-18: 4 mg via INTRAMUSCULAR

## 2018-09-18 MED ORDER — ENOXAPARIN SODIUM 40 MG/0.4ML ~~LOC~~ SOLN
40.0000 mg | SUBCUTANEOUS | Status: DC
  Administered 2018-09-19: 40 mg via SUBCUTANEOUS
  Filled 2018-09-18: qty 0.4

## 2018-09-18 MED ORDER — KETOROLAC TROMETHAMINE 30 MG/ML IJ SOLN
INTRAMUSCULAR | Status: AC
  Filled 2018-09-18: qty 1

## 2018-09-18 MED ORDER — ONDANSETRON HCL 4 MG/2ML IJ SOLN
INTRAMUSCULAR | Status: DC | PRN
  Administered 2018-09-18: 4 mg via INTRAVENOUS

## 2018-09-18 MED ORDER — NEOMYCIN-POLYMYXIN B GU 40-200000 IR SOLN
Status: AC
  Filled 2018-09-18: qty 1

## 2018-09-18 MED ORDER — DIPHENHYDRAMINE HCL 12.5 MG/5ML PO ELIX: 12.5000 mg | ORAL_SOLUTION | ORAL | Status: DC | PRN

## 2018-09-18 MED ORDER — BUPIVACAINE LIPOSOME 1.3 % IJ SUSP
INTRAMUSCULAR | Status: AC
  Filled 2018-09-18: qty 20

## 2018-09-18 MED ORDER — ONDANSETRON HCL 4 MG/2ML IJ SOLN: 4.0000 mg | Freq: Once | INTRAMUSCULAR | Status: DC | PRN

## 2018-09-18 MED ORDER — DEXMEDETOMIDINE HCL IN NACL 200 MCG/50ML IV SOLN
INTRAVENOUS | Status: AC
  Filled 2018-09-18: qty 50

## 2018-09-18 MED ORDER — PANTOPRAZOLE SODIUM 40 MG PO TBEC
40.0000 mg | DELAYED_RELEASE_TABLET | Freq: Every day | ORAL | Status: DC
  Administered 2018-09-19 – 2018-09-20 (×2): 40 mg via ORAL
  Filled 2018-09-18 (×2): qty 1

## 2018-09-18 MED ORDER — FENTANYL CITRATE (PF) 100 MCG/2ML IJ SOLN
INTRAMUSCULAR | Status: DC | PRN
  Administered 2018-09-18 (×2): 50 ug via INTRAVENOUS

## 2018-09-18 SURGICAL SUPPLY — 68 items
BLADE SAW 90X13X1.19 OSCILLAT (BLADE) ×3 IMPLANT
BLADE SAW 90X25X1.19 OSCILLAT (BLADE) ×3 IMPLANT
CANISTER SUCT 1200ML W/VALVE (MISCELLANEOUS) ×3 IMPLANT
CANISTER SUCT 3000ML PPV (MISCELLANEOUS) ×6 IMPLANT
CEMENT HV SMART SET (Cement) ×6 IMPLANT
CEMENT TIBIA MBT (Knees) ×1 IMPLANT
CNTNR SPEC 2.5X3XGRAD LEK (MISCELLANEOUS) ×1
CONT SPEC 4OZ STER OR WHT (MISCELLANEOUS) ×2
CONTAINER SPEC 2.5X3XGRAD LEK (MISCELLANEOUS) ×1 IMPLANT
COOLER POLAR GLACIER W/PUMP (MISCELLANEOUS) ×3 IMPLANT
COVER WAND RF STERILE (DRAPES) ×3 IMPLANT
CUFF TOURN SGL QUICK 24 (TOURNIQUET CUFF)
CUFF TOURN SGL QUICK 30 (TOURNIQUET CUFF)
CUFF TOURN SGL QUICK 42 (TOURNIQUET CUFF) ×3 IMPLANT
CUFF TRNQT CYL 24X4X16.5-23 (TOURNIQUET CUFF) IMPLANT
CUFF TRNQT CYL 30X4X21-28X (TOURNIQUET CUFF) IMPLANT
DRAPE 3/4 80X56 (DRAPES) ×6 IMPLANT
DRAPE INCISE IOBAN 66X60 STRL (DRAPES) ×3 IMPLANT
DRAPE SPLIT 6X30 W/TAPE (DRAPES) ×3 IMPLANT
DRAPE SURG 17X11 SM STRL (DRAPES) ×6 IMPLANT
DRSG OPSITE POSTOP 4X12 (GAUZE/BANDAGES/DRESSINGS) ×3 IMPLANT
DRSG OPSITE POSTOP 4X14 (GAUZE/BANDAGES/DRESSINGS) IMPLANT
DURAPREP 26ML APPLICATOR (WOUND CARE) ×9 IMPLANT
ELECT REM PT RETURN 9FT ADLT (ELECTROSURGICAL) ×3
ELECTRODE REM PT RTRN 9FT ADLT (ELECTROSURGICAL) ×1 IMPLANT
FEMUR SIGMA PS SZ 3.0 R (Femur) ×3 IMPLANT
GAUZE SPONGE 4X4 12PLY STRL (GAUZE/BANDAGES/DRESSINGS) ×3 IMPLANT
GLOVE BIOGEL PI IND STRL 9 (GLOVE) ×1 IMPLANT
GLOVE BIOGEL PI INDICATOR 9 (GLOVE) ×2
GLOVE SURG 9.0 ORTHO LTXF (GLOVE) ×6 IMPLANT
GOWN STRL REUS TWL 2XL XL LVL4 (GOWN DISPOSABLE) ×3 IMPLANT
GOWN STRL REUS W/ TWL LRG LVL3 (GOWN DISPOSABLE) ×1 IMPLANT
GOWN STRL REUS W/ TWL LRG LVL4 (GOWN DISPOSABLE) ×1 IMPLANT
GOWN STRL REUS W/TWL LRG LVL3 (GOWN DISPOSABLE) ×2
GOWN STRL REUS W/TWL LRG LVL4 (GOWN DISPOSABLE) ×2
HOLDER FOLEY CATH W/STRAP (MISCELLANEOUS) ×3 IMPLANT
IMMBOLIZER KNEE 19 BLUE UNIV (SOFTGOODS) ×3 IMPLANT
INSERT TIBIAL PFC SIG SZ3 10MM (Knees) ×3 IMPLANT
KIT TURNOVER KIT A (KITS) ×3 IMPLANT
NDL SAFETY ECLIPSE 18X1.5 (NEEDLE) ×1 IMPLANT
NEEDLE HYPO 18GX1.5 SHARP (NEEDLE) ×2
NEEDLE HYPO 22GX1.5 SAFETY (NEEDLE) ×3 IMPLANT
NEEDLE SPNL 20GX3.5 QUINCKE YW (NEEDLE) ×3 IMPLANT
NS IRRIG 1000ML POUR BTL (IV SOLUTION) ×3 IMPLANT
PACK TOTAL KNEE (MISCELLANEOUS) ×3 IMPLANT
PAD PREP 24X41 OB/GYN DISP (PERSONAL CARE ITEMS) ×3 IMPLANT
PAD WRAPON POLAR KNEE (MISCELLANEOUS) ×1 IMPLANT
PATELLA DOME PFC 38MM (Knees) ×3 IMPLANT
PENCIL SMOKE ULTRAEVAC 22 CON (MISCELLANEOUS) ×3 IMPLANT
PULSAVAC PLUS IRRIG FAN TIP (DISPOSABLE) ×3
SOL .9 NS 3000ML IRR  AL (IV SOLUTION) ×2
SOL .9 NS 3000ML IRR UROMATIC (IV SOLUTION) ×1 IMPLANT
SPONGE DRAIN TRACH 4X4 STRL 2S (GAUZE/BANDAGES/DRESSINGS) ×3 IMPLANT
SPONGE LAP 18X18 RF (DISPOSABLE) IMPLANT
STAPLER SKIN PROX 35W (STAPLE) ×3 IMPLANT
SUCTION FRAZIER HANDLE 10FR (MISCELLANEOUS) ×2
SUCTION TUBE FRAZIER 10FR DISP (MISCELLANEOUS) ×1 IMPLANT
SUT ETHIBOND NAB CT1 #1 30IN (SUTURE) ×6 IMPLANT
SUT VIC AB 0 CT1 36 (SUTURE) ×6 IMPLANT
SUT VIC AB 2-0 CT1 (SUTURE) ×6 IMPLANT
SYR 20ML LL LF (SYRINGE) ×3 IMPLANT
SYR 30ML LL (SYRINGE) ×6 IMPLANT
TIBIA MBT CEMENT (Knees) ×3 IMPLANT
TIP FAN IRRIG PULSAVAC PLUS (DISPOSABLE) ×1 IMPLANT
TOWER CARTRIDGE SMART MIX (DISPOSABLE) ×3 IMPLANT
TRAY FOLEY MTR SLVR 16FR STAT (SET/KITS/TRAYS/PACK) ×3 IMPLANT
TUBE SUCT KAM VAC (TUBING) ×3 IMPLANT
WRAPON POLAR PAD KNEE (MISCELLANEOUS) ×3

## 2018-09-18 NOTE — Progress Notes (Signed)
  Subjective:  POST OP CHECK: s/p right total knee arthroplasty.   Patient reports right knee pain as mild.  Patient states that she did well with physical therapy today.  She is up out of bed to a chair.  Patient as tolerated clear liquids and denies nausea or vomiting.  She has no chest pain or shortness of breath.  Objective:   VITALS:   Vitals:   09/18/18 1326 09/18/18 1344 09/18/18 1354 09/18/18 1655  BP: 128/74 128/74 136/79 128/75  Pulse: 82 82 80 75  Resp: 16 16 18 18   Temp: 98.4 F (36.9 C) 98.4 F (36.9 C) (!) 97 F (36.1 C) 97.8 F (36.6 C)  TempSrc: Oral Oral    SpO2: 94%  95% 98%  Weight:  107 kg    Height:  5\' 3"  (1.6 m)      PHYSICAL EXAM: Right lower extremity Neurovascular intact Sensation intact distally Intact pulses distally Dorsiflexion/Plantar flexion intact Incision: dressing C/D/I Compartment soft  LABS  Results for orders placed or performed during the hospital encounter of 09/18/18 (from the past 24 hour(s))  ABO/Rh     Status: None   Collection Time: 09/18/18  6:20 AM  Result Value Ref Range   ABO/RH(D)      A POS Performed at [facility], [address], [place], [place] [address]     Dg Knee Right Port  Result Date: 09/18/2018 CLINICAL DATA:  Osteoarthritis of the right knee. EXAM: PORTABLE RIGHT KNEE - 1-2 VIEW COMPARISON:  None. FINDINGS: The patient has undergone right total knee replacement. The components of the prosthesis appear in excellent position. No fractures. Expected postsurgical air in fluid. IMPRESSION: Satisfactory appearance of the right knee after total knee replacement. Electronically Signed   By: Lorriane Shire M.D.   On: 09/18/2018 13:02    Assessment/Plan: Day of Surgery   Active Problems:   S/P TKR (total knee replacement) using cement, right  Patient is doing very well postop.  Continue physical therapy.  Patient will complete 24 hours of postop antibiotics.  She is encouraged to perform incentive  spirometry 10 times each hour while awake.  Patient will be advanced to a regular diet.  She will begin Lovenox 40 mg daily for DVT prophylaxis beginning tomorrow.  Patient will have labs redrawn in the morning.  Patient may be weightbearing as tolerated on the right lower extremity.  I reviewed the postoperative x-rays which demonstrate that the total knee arthroplasty components are well-positioned.  There is no evidence of fracture dislocation or other postop complication.  Patient was given paper copies of her x-rays.    Th[place]ton [place] , MD 09/18/2018, 5:34 PM

## 2018-09-18 NOTE — Evaluation (Signed)
Physical Therapy Evaluation Patient Details Name: Jessica Brennan MRN: [id number] DOB: [birth year] Today's Date: 09/18/2018   History of Present Illness  53 y.o. female admitted on 09/18/2018 now s/p R TKA. Dx R knee osteoarthirits. PMH: anxiety, obesity,Diffuse cystic mastopathy, dysthymic disorder, hypertension, hypothyroidism. Past Surgies: L ACL, abdomimal hysterectomy, cholecystectomy.  Clinical Impression  Prior to hospital admission, pt was independent.  Pt lives in a 1-level house with 4 steps to enter with both side railings.  Currently pt is CGA with ambulation and CGA transfers from sitting on EOB to standing and CGA standing to sitting on chair. Supervision with bed mobility. Vc's provided for hand placement when pt standing from the bed. Pt requires reinforcement to maintain one hand support from bed. Able to ambulate 8 feet today with well maintained body positioning for RW management. R knee 12 degrees off from the neutral and 90 degrees in flexion. Pt would benefit from skilled PT to address noted impairments and functional limitations (see below for any additional details).  Upon hospital discharge, recommend pt discharge to OPPT to improve ROM, strengthening as well as gait to improve functional mobility.    Follow Up Recommendations Outpatient PT    Equipment Recommendations  Rolling walker with 5" wheels;3in1 (PT)    Recommendations for Other Services OT consult     Precautions / Restrictions Precautions Precautions: Knee Precaution Booklet Issued: Yes (comment) Required Braces or Orthoses: Knee Immobilizer - Right Knee Immobilizer - Right: On except when in CPM Restrictions Weight Bearing Restrictions: No RLE Weight Bearing: Weight bearing as tolerated      Mobility  Bed Mobility Overal bed mobility: Needs Assistance Bed Mobility: Supine to Sit     Supine to sit: Supervision     General bed mobility comments: Increased time and effort to get out off  bed  Transfers Overall transfer level: Needs assistance Equipment used: Rolling walker (2 wheeled) Transfers: Sit to/from Stand Sit to Stand: Min guard         General transfer comment: BUE support on RW standing from EOB. Vc's given for hand placemenst when standing from the EOB. Need reinforcement for hand placement.  Ambulation/Gait Ambulation/Gait assistance: Min guard Gait Distance (Feet): 8 Feet Assistive device: Rolling walker (2 wheeled) Gait Pattern/deviations: Decreased stance time - right;Decreased step length - right Gait velocity: Decreased   General Gait Details: Vc's provided to maintain hand and body positioning with RW management. Demonstrated good understanding and able to maintain with instructions.  Stairs            Wheelchair Mobility    Modified Rankin (Stroke Patients Only)       Balance Overall balance assessment: Needs assistance Sitting-balance support: No upper extremity supported;Feet supported Sitting balance-Leahy Scale: Good Sitting balance - Comments: steady sitting reaching within BOS   Standing balance support: Bilateral upper extremity supported Standing balance-Leahy Scale: Fair Standing balance comment: Able to maintain balance with BUE support on RW                             Pertinent Vitals/Pain Pain Assessment: 0-10 Pain Score: 4  Pain Location: R knee Pain Intervention(s): Limited activity within patient's tolerance;Monitored during session;Repositioned    Home Living Family/patient expects to be discharged to:: Private residence Living Arrangements: Alone Available Help at Discharge: Family(sister) Type of Home: House Home Access: Level entry;Stairs to enter Entrance Stairs-Rails: Can reach both Entrance Stairs-Number of Steps: 4 Home Layout: One level Home Equipment:  Cane - single point      Prior Function Level of Independence: Independent         Comments: ambulate with cane as baseline      Hand Dominance   Dominant Hand: Right    Extremity/Trunk Assessment   Upper Extremity Assessment Upper Extremity Assessment: Overall WFL for tasks assessed(at least 3/5 with BUE in shoulder flexion, elbow flexion and extension.)    Lower Extremity Assessment Lower Extremity Assessment: Overall WFL for tasks assessed(at least 4/5 with BLE in knee flexion/extension, hip flexion, foot dorsiflexion and plantarflexion)       Communication   Communication: No difficulties  Cognition Arousal/Alertness: Awake/alert Behavior During Therapy: WFL for tasks assessed/performed Overall Cognitive Status: Within Functional Limits for tasks assessed                                        General Comments General comments (skin integrity, edema, etc.): Polar care and knee stabilizer activated at beginning and re-attached at the end of the session.    Exercises Total Joint Exercises Ankle Circles/Pumps: AROM;Right;10 reps;Supine Quad Sets: AROM;Right;10 reps;Supine Gluteal Sets: AROM;Right;10 reps;Supine Short Arc Quad: AROM;Right;10 reps;Supine Heel Slides: AROM;Right;10 reps;Supine Hip ABduction/ADduction: AROM;Right;10 reps;Supine Long Arc Quad: AROM;Right;10 reps;Seated Knee Flexion: AROM;Right;10 reps;Seated Goniometric ROM: R Knee extension 12 degree off neutral; R knee flexion 90 degrees.   Assessment/Plan    PT Assessment Patient needs continued PT services  PT Problem List Decreased range of motion;Decreased activity tolerance;Decreased balance;Decreased mobility;Decreased knowledge of precautions;Decreased knowledge of use of DME;Obesity;Decreased strength       PT Treatment Interventions DME instruction;Gait training;Stair training;Functional mobility training;Therapeutic activities;Therapeutic exercise;Balance training;Neuromuscular re-education    PT Goals (Current goals can be found in the Care Plan section)  Acute Rehab PT Goals Patient Stated Goal:  to go home PT Goal Formulation: With patient Time For Goal Achievement: 10/03/18 Potential to Achieve Goals: Good    Frequency BID   Barriers to discharge        Co-evaluation               AM-PAC PT "6 Clicks" Mobility  Outcome Measure Help needed turning from your back to your side while in a flat bed without using bedrails?: A Little Help needed moving from lying on your back to sitting on the side of a flat bed without using bedrails?: A Little Help needed moving to and from a bed to a chair (including a wheelchair)?: A Little Help needed standing up from a chair using your arms (e.g., wheelchair or bedside chair)?: A Little Help needed to walk in hospital room?: A Little Help needed climbing 3-5 steps with a railing? : A Little 6 Click Score: 18    End of Session Equipment Utilized During Treatment: Gait belt Activity Tolerance: Patient tolerated treatment well Patient left: in chair;with SCD's reapplied;in CPM;with call bell/phone within reach;with chair alarm set Nurse Communication: Weight bearing status;Mobility status;Precautions PT Visit Diagnosis: Muscle weakness (generalized) (M62.81);Other abnormalities of gait and mobility (R26.89);Pain;Difficulty in walking, not elsewhere classified (R26.2) Pain - Right/Left: Right Pain - part of body: Knee    Time: [id number] PT Time Calculation (min) (ACUTE ONLY): 53 min   Charges:               Nelly RoutNan Santia Labate, SPT 09/18/2018, 4:58 PM

## 2018-09-18 NOTE — Anesthesia Procedure Notes (Signed)
Procedure Name: LMA Insertion Date/Time: 09/18/2018 8:30 AM Performed by: Justus Memory, CRNA Pre-anesthesia Checklist: Patient identified, Patient being monitored, Timeout performed, Emergency Drugs available and Suction available Patient Re-evaluated:Patient Re-evaluated prior to induction Oxygen Delivery Method: Circle system utilized Preoxygenation: Pre-oxygenation with 100% oxygen Induction Type: IV induction Ventilation: Mask ventilation without difficulty LMA: LMA inserted LMA Size: 3.5 Tube type: Oral Number of attempts: 1 Placement Confirmation: positive ETCO2 and breath sounds checked- equal and bilateral Tube secured with: Tape Dental Injury: Teeth and Oropharynx as per pre-operative assessment

## 2018-09-18 NOTE — H&P (Signed)
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 Symptoms of pain and limited range of motion are significantly impairing activities of daily living.  Patient has failed nonoperative management.  She is elected to proceed with a right total knee arthroplasty.  Radiographs demonstrate advanced tricompartmental osteoarthritic changes including severe joint space narrowing, subchondral sclerosis and cyst formation along with marginal osteophytes.  Past Medical History:  Diagnosis Date  . Anxiety   . Asthma   . Diffuse cystic mastopathy   . Dysthymic disorder   . Hypertension   . Hypothyroidism   . Menopausal state   . Obesity   . Vitamin D deficiency    Past Surgical History:  Procedure Laterality Date  . ABDOMINAL HYSTERECTOMY    . ANTERIOR CRUCIATE LIGAMENT REPAIR    . BREAST BIOPSY Left    1979 negative  . CHOLECYSTECTOMY    . DG LEFT TIBIA AND FIBULA ([facility] HX) Left   . SEPTOPLASTY     Social History   Socioeconomic History  . Marital status: Divorced    Spouse name: Not on file  . Number of children: Not on file  . Years of education: Not on file  . Highest education level: Not on file  Occupational History  . Not on file  Social Needs  . Financial resource strain: Not on file  . Food insecurity    Worry: Not on file    Inability: Not on file  . Transportation needs    Medical: Not on file    Non-medical: Not on file  Tobacco Use  . Smoking status: Never Smoker  . Smokeless tobacco: Never Used  Substance and Sexual Activity  . Alcohol use: Yes    Alcohol/week: 6.0 standard drinks    Types: 6 Cans of beer per week    Comment: on occasion  . Drug use: No  . Sexual activity: Never  Lifestyle  . Physical activity    Days per week: Not on file    Minutes per  session: Not on file  . Stress: Not on file  Relationships  . Social Musicianconnections    Talks on phone: Not on file    Gets together: Not on file    Attends religious service: Not on file    Active member of club or organization: Not on file    Attends meetings of clubs or organizations: Not on file    Relationship status: Not on file  Other Topics Concern  . Not on file  Social History Narrative  . Not on file   Family History  Problem Relation Age of Onset  . Cancer Mother        lung  . Heart disease Father   . Hyperlipidemia Sister   . Arthritis Sister   . Hyperlipidemia Brother   . Arthritis Brother   . Cancer Paternal Grandmother        uterine  . Stroke Paternal Grandfather   . Arthritis Brother   . Hyperlipidemia Brother   . Arthritis Brother   . Arthritis Sister    Allergies  Allergen Reactions  . Codeine Sulfate Hives and Other (See Comments)    Stomachache, GI upset, welts  . Other Other (See Comments)    Mold- Sinus infections  . Pollen Extract Other (See Comments)    sneezing   Prior  to Admission medications   Medication Sig Start Date End Date Taking? Authorizing Provider  albuterol (PROVENTIL HFA;VENTOLIN HFA) 108 (90 Base) MCG/ACT inhaler Inhale 2 puffs into the lungs every 6 (six) hours as needed for wheezing or shortness of breath. 05/14/18  Yes Cannady, Jolene T, NP  amLODipine (NORVASC) 5 MG tablet TAKE 1 TABLET BY MOUTH EVERY DAY 09/15/18  Yes Cannady, Jolene T, NP  atorvastatin (LIPITOR) 20 MG tablet Take 1 tablet (20 mg total) by mouth daily. Patient taking differently: Take 20 mg by mouth daily at 6 PM.  05/15/18  Yes Cannady, Jolene T, NP  levothyroxine (SYNTHROID) 75 MCG tablet TAKE 1 TABLET (75 MCG TOTAL) BY MOUTH DAILY BEFORE BREAKFAST. 08/14/18  Yes Cannady, Jolene T, NP  loratadine (CLARITIN) 10 MG tablet Take 10 mg by mouth daily as needed for allergies.   Yes [provider]  sertraline (ZOLOFT) 100 MG tablet Take 150 mg by mouth  daily.  07/16/18  Yes [provider]  venlafaxine XR (EFFEXOR-XR) 75 MG 24 hr capsule Take 3 capsules (225 mg total) by mouth daily with breakfast. DX: F32.2 Patient taking differently: Take 75 mg by mouth daily with breakfast. DX: F32.2 11/13/17 09/15/18 Yes Johnson, Megan P, DO  ibuprofen (ADVIL) 200 MG tablet Take 400 mg by mouth every 6 (six) hours as needed for moderate pain.    [provider]     Positive ROS: All other systems have been reviewed and were otherwise negative with the exception of those mentioned in the HPI and as above.  Physical Exam: General: Alert, no acute distress Cardiovascular: Regular rate and rhythm, no murmurs rubs or gallops.  No pedal edema Respiratory: Clear to auscultation bilaterally, no wheezes rales or rhonchi. No cyanosis, no use of accessory musculature GI: No organomegaly, abdomen is soft and non-tender nondistended with positive bowel sounds. Skin: Skin intact, no lesions within the operative field. Neurologic: Sensation intact distally Psychiatric: Patient is competent for consent with normal mood and affect Lymphatic: No cervical lymphadenopathy  MUSCULOSKELETAL: Right knee: Patient skin is intact.  Her range of motion is from 0 to 110 degrees.  She has no ligamentous laxity.  She has medial joint line tenderness.  Patient has pain medially with McMurray's testing.  She has palpable pedal pulses, intact sensation light touch and intact motor function without detectable weakness.  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  I have also discussed the risks and benefits of surgery.  She understands the risks include but are not limited to infection requiring the removal of the prosthesis, bleeding requiring blood transfusion, nerve or blood vessel injury,  joint stiffness or loss of motion, persistent pain, weakness or instability, fracture, dislocation and hardware failure and the need for further surgery. Medical risks include but are not limited to DVT and pulmonary embolism, myocardial infarction, stroke, pneumonia, respiratory failure and death. Patient understood these risks and wished to proceed.     Thornton Park, MD   09/18/2018 7:53 AM

## 2018-09-18 NOTE — Anesthesia Preprocedure Evaluation (Signed)
Anesthesia Evaluation  Patient identified by MRN, date of birth, ID band Patient awake    Reviewed: Allergy & Precautions, H&P , NPO status , Patient's Chart, lab work & pertinent test results, reviewed documented beta blocker date and time   Airway Mallampati: II   Neck ROM: full    Dental  (+) Poor Dentition   Pulmonary asthma , sleep apnea ,    Pulmonary exam normal        Cardiovascular Exercise Tolerance: Poor hypertension, On Medications negative cardio ROS Normal cardiovascular exam Rhythm:regular Rate:Normal     Neuro/Psych PSYCHIATRIC DISORDERS Anxiety Depression negative neurological ROS     GI/Hepatic negative GI ROS, Neg liver ROS,   Endo/Other  Hypothyroidism Morbid obesity  Renal/GU negative Renal ROS  negative genitourinary   Musculoskeletal   Abdominal   Peds  Hematology negative hematology ROS (+)   Anesthesia Other Findings Past Medical History: No date: Anxiety No date: Asthma No date: Diffuse cystic mastopathy No date: Dysthymic disorder No date: Hypertension No date: Hypothyroidism No date: Menopausal state No date: Obesity No date: Vitamin D deficiency Past Surgical History: No date: ABDOMINAL HYSTERECTOMY No date: ANTERIOR CRUCIATE LIGAMENT REPAIR No date: BREAST BIOPSY; Left     Comment:  1979 negative No date: CHOLECYSTECTOMY No date: DG LEFT TIBIA AND FIBULA ([place] HX); Left No date: SEPTOPLASTY   Reproductive/Obstetrics negative OB ROS                             Anesthesia Physical Anesthesia Plan  ASA: III  Anesthesia Plan: Spinal   Post-op Pain Management:    Induction:   PONV Risk Score and Plan:   Airway Management Planned:   Additional Equipment:   Intra-op Plan:   Post-operative Plan:   Informed Consent: I have reviewed the patients History and Physical, chart, labs and discussed the procedure including the risks, benefits and  alternatives for the proposed anesthesia with the patient or authorized representative who has indicated his/her understanding and acceptance.     Dental Advisory Given  Plan Discussed with: CRNA  Anesthesia Plan Comments:         Anesthesia Quick Evaluation

## 2018-09-18 NOTE — Anesthesia Post-op Follow-up Note (Signed)
Anesthesia QCDR form completed.        

## 2018-09-18 NOTE — Op Note (Signed)
DATE OF SURGERY:  09/18/2018 TIME: 12:27 PM  PATIENT NAME:  Jessica Brennan   AGE: 53 y.o.    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  EBL:  50  TOURNIQUET TIME:  118 minutes  PREOPERATIVE INDICATIONS:  Jessica Brennan is an 53 y.o. 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  Her knee pain significantly impacts her ability to ambulate and perform activity of daily living.  Radiographs have demonstrated tricompartmental osteoarthritis joint space narrowing, osteophytes, subchondral sclerosis and cyst formation.  The risks, benefits, and alternatives were discussed at length including but not limited to the risks of infection, bleeding, nerve or blood vessel injury, knee stiffness, fracture, dislocation, loosening or failure of the hardware and the need for further surgery. Medical risks include but not limited to DVT and pulmonary embolism, myocardial infarction, stroke, pneumonia, respiratory failure and death. I discussed these risks with the patient in my office prior to the date of surgery. They understood these risks and were willing to proceed.  OPERATIVE FINDINGS AND UNIQUE ASPECTS OF THE CASE: Advanced tricompartmental osteoarthritis with large osteophytes  OPERATIVE DESCRIPTION:  The patient was brought to the operative room and placed in a supine position after undergoing general anesthesia.  A spinal anesthetic was attempted by the anesthesia  service but was unable to be placed.  A Foley catheter was placed.  IV antibiotics were given. Patient received Ancef 3 g and clindamycin 900mg  IV. The lower extremity was prepped and draped in the usual sterile fashion.  A time out was performed to verify the patient's name, date of birth, medical record number, correct site of surgery and correct procedure to be performed. The timeout was also used to confirm the patient received antibiotics and that appropriate instruments, implants and radiographs studies were available in the room.  Patient was given tranexamic acid IV before the tourniquet was inflated.  The leg was elevated and exsanguinated with an Esmarch and the tourniquet was inflated to 300 mmHg for 118 minutes..  A midline incision was made over the right knee. Full-thickness skin flaps were developed. A medial parapatellar arthrotomy was then made and the patella everted and the knee was brought into 90 of flexion. Hoffa's fat pad along with the cruciate ligaments and medial and lateral menisci were resected.   The distal femoral intramedullary canal was opened with a drill and the intramedullary distal femoral cutting jig was inserted into the femoral canal pinned into position. It was set at 5 degrees resecting 10 mm off the distal femur.  Care was taken to protect the collateral ligaments during distal femoral resection.  The distal femoral resection was performed with an oscillating saw. The femoral cutting guide was then removed.  The extramedullary tibial cutting guide was then placed using the anterior tibial crest and second ray of the foot as a references.  The tibial cutting guide was adjusted to allow for appropriate posterior slope.  The tibial cutting block was pinned into position. The slotted stylus was used to measure the proximal tibial resection of 10  mm off the high lateral side.  The tibial long rod alignment guide was then used to confirm position of the cutting block. A  third cross pin through the tibial cutting block was then drilled into position to allow for rotational stability. Care was taken during the tibial resection to protect the medial and collateral ligaments.  The resected tibial bone was removed along with the posterior horns of the menisci.  The PCL was sacrificed.  Extension gap was measured with a spacer block and alignment and extension was confirmed using a long alignment rod.  The attention was then turned back to the femur. The posterior referencing distal femoral sizing guide was applied to the distal femur.  The femur was sized to be a size 3. Rotation of the referencing guide was checked with the epicondylar axis and Whitesides line. Then the 4-in-1 cutting jig was then applied to the distal femur. A stylus was used to confirm that the anterior femur would not be notched.   Then the anterior, posterior and chamfer femoral cuts were then made with an oscillating saw.  The flexion gap was then measured with a flexion spacer block and long alignment rod and was found to be symmetric with the extension gap and perpendicular to mechanical axis of the tibia.  The distal femoral preparation was completed by performing the posterior stabilized box cut using the cutting block. The entry site for the intramedullary femoral guide was filled with autologous bone graft from bone previously resected earlier in the case.  The proximal tibia plateau was then sized with trial trays. The best coverage was achieved with a size 3. This tibial tray was then pinned into position. The proximal tibia was then prepared with the reamer and keel punch.  After tibial preparation was completed, all trial components were inserted with polyethylene trials.  The knee was found to have excellent balance and full motion with a size 10 mm tibial polyethylene insert..    The attention was then turned to preparation of the patella. The thickness of the patella was measured with a  caliper, the diameter measured with the patella templates.  The patella resection was then made with an oscillating saw using the patella cutting guide.  3 peg holes for the patella component were then drilled. The trial patella was then placed. Knee was taken through a full range of motion and deemed to be stable with the trial components. All trial components were then removed. The knee capsule was then injected with Exparel.  The knee joint capsule was injected with a mixture of quarter percent Marcaine, Toradol and morphine to assist with postoperative pain relief.  The joint was copiously irrigated with pulse lavage.  The final total knee arthroplasty components were then cemented into place with a 10 mm trial polyethylene insert and all excess methylmethacrylate was removed.  The joint was again copiously irrigated. After the cement had hardened the knee was again taken through a full range of motion. It was felt to be most stable with the 10 mm tibial polyethylene insert. The actual tibial polyethylene insert was then placed.   The knee was taken through a range of motion and the patella tracked well and the knee was again irrigated copiously.    The medial arthrotomy was closed with #1 Ethibond. The subcutaneous tissue closed with 0 and 2-0 vicryl, and skin approximated with staples.  A dry sterile and compressive dressing was applied.  A Polar Care was applied to the operative knee  along with a knee immobilizer.  The patient was awakened and brought to the PACU in stable and satisfactory condition.  All sharp, lap and instrument counts were correct at the conclusion the case. I spoke with the patient's sister in the postop consultation room to let her know the case had been performed without complication and the patient was stable in recovery room.

## 2018-09-18 NOTE — TOC Initial Note (Signed)
Transition of Care [facility]) - Initial/Assessment Note    Patient Details  Name: Jessica Brennan MRN: [id number] Date of Birth: April 18, 1965  Transition of Care [facility]) CM/SW Contact:    Su Hilt, RN Phone Number: 09/18/2018, 3:49 PM  Clinical Narrative:                 Met with the patient to discuss DC plan and needs She lives home alone but her sister will be staying with her for 1 month to help She has an outpatient PT appointment scheduled at [facility] She has a cane at home but needs a RW and 3 in 1 I notified Brad of the need Her sister will provide transportation [facility] uses [facility] and can afford her medication She goes to Dr Ned Card and is up to date  No additional needs Expected Discharge Plan: Home/Self Care Barriers to Discharge: Continued Medical Work up   Patient Goals and CMS Choice Patient states their goals for this hospitalization and ongoing recovery are:: go home      Expected Discharge Plan and Services Expected Discharge Plan: Home/Self Care   Discharge Planning Services: CM Consult   Living arrangements for the past 2 months: Single Family Home                 DME Arranged: 3-N-1, Walker rolling DME Agency: [employer] Date DME Agency Contacted: 09/18/18 Time DME Agency Contacted: [phone] Representative spoke with at DME Agency: Leroy Sea            Prior Living Arrangements/Services Living arrangements for the past 2 months: [place] with:: Self(sister will be staying with her for a month to help) Patient language and need for interpreter reviewed:: No Do you feel safe going back to the place where you live?: Yes      Need for Family Participation in Patient Care: No (Comment) Care giver support system in place?: Yes (comment) Current home services: DME(cane) Criminal Activity/Legal Involvement Pertinent to Current Situation/Hospitalization: No - Comment as needed  Activities of Daily Living Home Assistive Devices/Equipment:  Cane (specify quad or straight) ADL Screening (condition at time of admission) Patient's cognitive ability adequate to safely complete daily activities?: Yes Is the patient deaf or have difficulty hearing?: No Does the patient have difficulty seeing, even when wearing glasses/contacts?: No Does the patient have difficulty concentrating, remembering, or making decisions?: No Patient able to express need for assistance with ADLs?: Yes Does the patient have difficulty dressing or bathing?: No Independently performs ADLs?: Yes (appropriate for developmental age) Does the patient have difficulty walking or climbing stairs?: Yes Weakness of Legs: Right Weakness of Arms/Hands: None  Permission Sought/Granted   Permission granted to share information with : Yes, Verbal Permission Granted              Emotional Assessment Appearance:: Appears stated age Attitude/Demeanor/Rapport: Ambitious, Engaged Affect (typically observed): Appropriate, Happy Orientation: : Oriented to Self, Oriented to Place, Oriented to  Time, Oriented to Situation Alcohol / Substance Use: Not Applicable Psych Involvement: No (comment)  Admission diagnosis:  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 S/P TKR (total knee replacement) using cement, right 09/18/2018  . Hypertension 03/30/2016  . Hypothyroidism 01/12/2015  . Menopausal state 10/07/2014  . Depression 10/07/2014  . Vitamin D deficiency 10/07/2014  . Acute anxiety 10/07/2014  . Morbid obesity ([place]) 10/07/2014  . Sleep apnea 10/07/2014  . Hyperlipidemia 10/07/2014   PCP:  Ned Card,  Jolene T, NP Pharmacy:   [id number] [place], [place] - [address] [place] [address] Phone: [phone] Fax: [phone]     Social Determinants of Health (SDOH) Interventions    Readmission Risk Interventions No flowsheet data found.  

## 2018-09-18 NOTE — Transfer of Care (Signed)
Immediate Anesthesia Transfer of Care Note  Patient: Lindee Leason  Procedure(s) Performed: TOTAL KNEE ARTHROPLASTY (Right )  Patient Location: PACU  Anesthesia Type:General  Level of Consciousness: sedated  Airway & Oxygen Therapy: Patient Spontanous Breathing and Patient connected to face mask oxygen  Post-op Assessment: Report given to RN and Post -op Vital signs reviewed and stable  Post vital signs: Reviewed and stable  Last Vitals:  Vitals Value Taken Time  BP 129/63 09/18/18 1146  Temp    Pulse 89 09/18/18 1151  Resp 21 09/18/18 1151  SpO2 96 % 09/18/18 1151  Vitals shown include unvalidated device data.  Last Pain:  Vitals:   09/18/18 1146  TempSrc:   PainSc: 0-No pain         Complications: No apparent anesthesia complications

## 2018-09-19 LAB — BASIC METABOLIC PANEL
Anion gap: 6 (ref 5–15)
BUN: 20 mg/dL (ref 6–20)
CO2: 25 mmol/L (ref 22–32)
Calcium: 7.9 mg/dL — ABNORMAL LOW (ref 8.9–10.3)
Chloride: 107 mmol/L (ref 98–111)
Creatinine, Ser: 0.78 mg/dL (ref 0.44–1.00)
GFR calc Af Amer: >60 mL/min (ref >60)
GFR calc non Af Amer: >60 mL/min (ref >60)
Glucose, Bld: 129 mg/dL — ABNORMAL HIGH (ref 70–99)
Potassium: 3.6 mmol/L (ref 3.5–5.1)
Sodium: 138 mmol/L (ref 135–145)

## 2018-09-19 LAB — CBC
HCT: 35.7 % — ABNORMAL LOW (ref 36.0–46.0)
Hemoglobin: 11.3 g/dL — ABNORMAL LOW (ref 12.0–15.0)
MCH: 29.6 pg (ref 26.0–34.0)
MCHC: 31.7 g/dL (ref 30.0–36.0)
MCV: 93.5 fL (ref 80.0–100.0)
Platelets: 196 10*3/uL (ref 150–400)
RBC: 3.82 MIL/uL — ABNORMAL LOW (ref 3.87–5.11)
RDW: 14.7 % (ref 11.5–15.5)
WBC: 11.3 10*3/uL — ABNORMAL HIGH (ref 4.0–10.5)
nRBC: 0 % (ref 0.0–0.2)

## 2018-09-19 MED ORDER — ENOXAPARIN SODIUM 40 MG/0.4ML ~~LOC~~ SOLN
40.0000 mg | Freq: Two times a day (BID) | SUBCUTANEOUS | Status: DC
  Administered 2018-09-19 – 2018-09-20 (×2): 40 mg via SUBCUTANEOUS
  Filled 2018-09-19 (×2): qty 0.4

## 2018-09-19 NOTE — Progress Notes (Signed)
Anticoagulation monitoring(Lovenox):  53yo  F ordered Lovenox 40 mg Q24h  Filed Weights   09/18/18 1344  Weight: 235 lb 14.3 oz (107 kg)   BMI 41.08   Lab Results  Component Value Date   CREATININE 0.78 09/19/2018   CREATININE 0.78 09/15/2018   CREATININE 0.71 08/25/2018   Estimated Creatinine Clearance: 93.1 mL/min (by C-G formula based on SCr of 0.78 mg/dL). Hemoglobin & Hematocrit     Component Value Date/Time   HGB 11.3 (L) 09/19/2018 0324   HGB 14.3 08/25/2018 1145   HCT 35.7 (L) 09/19/2018 0324   HCT 42.3 08/25/2018 1145     Per Protocol for Patient with estCrcl > 30 ml/min and BMI > 40, will transition to Lovenox 40 mg Q12h.      Chinita Greenland PharmD Clinical Pharmacist 09/19/2018

## 2018-09-19 NOTE — Progress Notes (Signed)
Physical Therapy Treatment Patient Details Name: Jessica Brennan MRN: [id number] DOB: 1965-11-11 Today's Date: 09/19/2018    History of Present Illness 53 y.o. female admitted on 09/18/2018 now s/p R TKA. Dx R knee osteoarthirits. PMH: anxiety, obesity,Diffuse cystic mastopathy, dysthymic disorder, hypertension, hypothyroidism. Past Surgies: L ACL, abdomimal hysterectomy, cholecystectomy.    PT Comments    Pt received sitting in recliner and agreeable to PT session. Stairs trial offered but pt request to defer to later session due to increased pain to 7/10 today. Pt agreeable to ambulate and exercise program. CGA for transfer from sitting on recliner and standing; CGA for ambulating 20 feet inside of the room. Limited ambulation distance due to R LE weakness secondary to high pain level. Able to perform exercise in bed but limited reps due to pain. Pt request for pain medication and nurse notified. R knee extension 9 degrees lack of neutral which is matching the L knee level of extension as the baseline. Will initiate stairs trial as appropriate in later session. Pt will benefit from PT session to continue progress ROM, strength, stair negotiation to increase functional mobility and a safe return home.    Follow Up Recommendations  Outpatient PT     Equipment Recommendations  Rolling walker with 5" wheels;3in1 (PT)    Recommendations for Other Services OT consult     Precautions / Restrictions Precautions Precautions: Knee;Fall Precaution Booklet Issued: Yes (comment) Required Braces or Orthoses: Knee Immobilizer - Right Knee Immobilizer - Right: On except when in CPM Restrictions Weight Bearing Restrictions: Yes RLE Weight Bearing: Weight bearing as tolerated    Mobility  Bed Mobility Overal bed mobility: Needs Assistance Bed Mobility: Sit to Supine     Supine to sit: Supervision     General bed mobility comments: Bed flated and able to transfer sitting to supine on EOB  without using bed railings.  Transfers Overall transfer level: Needs assistance Equipment used: Rolling walker (2 wheeled) Transfers: Sit to/from Stand Sit to Stand: Min guard         General transfer comment: Vc's reinforced for hand placemenst when standing from the EOB. 5 time sit to stand practiced with 3 times from chair and 2 times from bed. Able maintain good hand positions and RW management  Ambulation/Gait Ambulation/Gait assistance: Min guard Gait Distance (Feet): 20 Feet Assistive device: Rolling walker (2 wheeled) Gait Pattern/deviations: Decreased stance time - right;Decreased step length - right Gait velocity: Decreased   General Gait Details: Pt presents with R LE shaky and weakness during ambulation due to pain. Pt request to sit down due to weakness of her R LE due to pain. Pt requests to end the session early and transfered to bed. Bed mobilily practice followed.   Stairs             Wheelchair Mobility    Modified Rankin (Stroke Patients Only)       Balance Overall balance assessment: Needs assistance Sitting-balance support: No upper extremity supported;Feet supported Sitting balance-Leahy Scale: Good Sitting balance - Comments: steady sitting reaching within BOS   Standing balance support: Bilateral upper extremity supported Standing balance-Leahy Scale: Fair Standing balance comment: Able to maintain balance with BUE support on RW                            Cognition Arousal/Alertness: Awake/alert Behavior During Therapy: WFL for tasks assessed/performed Overall Cognitive Status: Within Functional Limits for tasks assessed  Exercises Total Joint Exercises Ankle Circles/Pumps: AROM;Right;10 reps;Supine Hip ABduction/ADduction: AROM;Right;10 reps;Supine Goniometric ROM: R Knee extension 9 degree off neutral; R knee flexion deffered due to pain. General Exercises - Lower  Extremity Straight Leg Raises: AROM;Right;Seated;5 reps(pt unable to proceed further d/t pain)    General Comments General comments (skin integrity, edema, etc.): Polar care and knee stabilizer activated at beginning and re-attached at the end of the session. Mild drainage on honeycomb noted.      Pertinent Vitals/Pain Pain Assessment: 0-10 Pain Score: 7  Pain Location: R knee Pain Descriptors / Indicators: Aching;Burning Pain Intervention(s): Limited activity within patient's tolerance;Monitored during session;Patient requesting pain meds-RN notified;Repositioned(polar care on and activated)    Home Living                      Prior Function            PT Goals (current goals can now be found in the care plan section) Acute Rehab PT Goals Patient Stated Goal: to go home PT Goal Formulation: With patient Time For Goal Achievement: 10/03/18 Potential to Achieve Goals: Good Progress towards PT goals: Progressing toward goals    Frequency    BID      PT Plan Current plan remains appropriate    Co-evaluation              AM-PAC PT "6 Clicks" Mobility   Outcome Measure  Help needed turning from your back to your side while in a flat bed without using bedrails?: A Little Help needed moving from lying on your back to sitting on the side of a flat bed without using bedrails?: A Little Help needed moving to and from a bed to a chair (including a wheelchair)?: A Little Help needed standing up from a chair using your arms (e.g., wheelchair or bedside chair)?: A Little Help needed to walk in hospital room?: A Little Help needed climbing 3-5 steps with a railing? : A Little 6 Click Score: 18    End of Session Equipment Utilized During Treatment: Gait belt;Right knee immobilizer Activity Tolerance: Patient limited by pain Patient left: in chair;with SCD's reapplied;in CPM;with call bell/phone within reach;with chair alarm set Nurse Communication: Weight bearing  status;Mobility status;Precautions(Pain status) PT Visit Diagnosis: Muscle weakness (generalized) (M62.81);Other abnormalities of gait and mobility (R26.89);Pain;Difficulty in walking, not elsewhere classified (R26.2) Pain - Right/Left: Right Pain - part of body: Knee     Time: 2355-7322 PT Time Calculation (min) (ACUTE ONLY): 26 min  Charges:                          Sherrilyn Rist, SPT 09/19/2018, 3:43 PM

## 2018-09-19 NOTE — Evaluation (Signed)
Occupational Therapy Evaluation Patient Details Name: Jessica Brennan MRN: [id number] DOB: 1965/11/03 Today's Date: 09/19/2018    History of Present Illness 53 y.o. female admitted on 09/18/2018 now s/p R TKA. Dx R knee osteoarthirits. PMH: anxiety, obesity,Diffuse cystic mastopathy, dysthymic disorder, hypertension, hypothyroidism. Past Surgies: L ACL, abdomimal hysterectomy, cholecystectomy.   Clinical Impression   Pt seen for OT evaluation this date, POD#1 from above surgery. Pt was independent in all ADL prior to surgery, however using SPC for functional mobility due to L knee pain. Pt is eager to return to PLOF with less pain and improved safety and independence. Pt currently requires PRN minimal assist for LB ADL while in seated position due to pain and limited AROM of L knee. Pt instructed in polar care mgt, falls prevention strategies, home/routines modifications, DME/AE for LB bathing and dressing tasks, and pet care considerations. Handout provided. Pt would benefit from skilled OT services including additional instruction in dressing techniques with or without assistive devices for dressing and bathing skills to support recall and carryover prior to discharge and ultimately to maximize safety, independence, and minimize falls risk and caregiver burden. Do not currently anticipate any OT needs following this hospitalization.      Follow Up Recommendations  No OT follow up    Equipment Recommendations  3 in 1 bedside commode    Recommendations for Other Services       Precautions / Restrictions Precautions Precautions: Knee;Fall Restrictions Weight Bearing Restrictions: Yes RLE Weight Bearing: Weight bearing as tolerated      Mobility Bed Mobility               General bed mobility comments: deferred, received in recliner  Transfers Overall transfer level: Needs assistance Equipment used: Rolling walker (2 wheeled) Transfers: Sit to/from Stand Sit to Stand: Min  guard              Balance Overall balance assessment: Needs assistance Sitting-balance support: No upper extremity supported;Feet supported Sitting balance-Leahy Scale: Good     Standing balance support: Bilateral upper extremity supported Standing balance-Leahy Scale: Fair                             ADL either performed or assessed with clinical judgement   ADL Overall ADL's : Needs assistance/impaired                                       General ADL Comments: CGA for functional mobility and toilet transfers, PRN Min A for LB ADL tasks     Vision Baseline Vision/History: Wears glasses Wears Glasses: Reading only Patient Visual Report: No change from baseline       Perception     Praxis      Pertinent Vitals/Pain Pain Assessment: 0-10 Pain Score: 7  Pain Location: R knee Pain Descriptors / Indicators: Aching Pain Intervention(s): Limited activity within patient's tolerance;Monitored during session;Repositioned;Ice applied     Hand Dominance Right   Extremity/Trunk Assessment     Lower Extremity Assessment Lower Extremity Assessment: Defer to PT evaluation(expected post-op strength/ROM deficits with LLE)   Cervical / Trunk Assessment Cervical / Trunk Assessment: Normal   Communication Communication Communication: No difficulties   Cognition Arousal/Alertness: Awake/alert Behavior During Therapy: WFL for tasks assessed/performed Overall Cognitive Status: Within Functional Limits for tasks assessed  General Comments       Exercises Other Exercises Other Exercises: pt instructed in polar care mgt, pet care considerations and falls prevention, AE/DME, and home/routines modifications to maximize safety/independence; handout provided   Shoulder Instructions      Home Living Family/patient expects to be discharged to:: Private residence Living Arrangements:  Alone Available Help at Discharge: Family;Available 24 hours/day(sister for a couple weeks) Type of Home: House Home Access: Level entry;Stairs to enter Entrance Stairs-Number of Steps: 4 Entrance Stairs-Rails: Can reach both Home Layout: One level     Bathroom Shower/Tub: Teacher, early years/pre: Standard Bathroom Accessibility: Yes   Home Equipment: Cane - single point          Prior Functioning/Environment Level of Independence: Independent        Comments: ambulate with cane as baseline        OT Problem List: Decreased range of motion;Pain;Decreased strength;Decreased knowledge of use of DME or AE      OT Treatment/Interventions: Self-care/ADL training;Therapeutic exercise;Therapeutic activities;DME and/or AE instruction;Patient/family education    OT Goals(Current goals can be found in the care plan section) Acute Rehab OT Goals Patient Stated Goal: to go home OT Goal Formulation: With patient Time For Goal Achievement: 10/03/18 Potential to Achieve Goals: Good ADL Goals Pt Will Perform Lower Body Dressing: sit to/from stand;with adaptive equipment;with modified independence Pt Will Transfer to Toilet: with supervision;ambulating(BSC over top, LRAD for amb) Additional ADL Goal #1: Pt will independently instruct family in polar care mgt.  OT Frequency: Min 1X/week   Barriers to D/C:            Co-evaluation              AM-PAC OT "6 Clicks" Daily Activity     Outcome Measure Help from another person eating meals?: None Help from another person taking care of personal grooming?: None Help from another person toileting, which includes using toliet, bedpan, or urinal?: A Little Help from another person bathing (including washing, rinsing, drying)?: A Little Help from another person to put on and taking off regular upper body clothing?: None Help from another person to put on and taking off regular lower body clothing?: A Little 6 Click  Score: 21   End of Session CPM Right Knee CPM Right Knee: Off  Activity Tolerance: Patient tolerated treatment well Patient left: in chair;with call bell/phone within reach;with chair alarm set  OT Visit Diagnosis: Other abnormalities of gait and mobility (R26.89);Pain Pain - Right/Left: Left Pain - part of body: Knee                Time: 7353-2992 OT Time Calculation (min): 32 min Charges:  OT General Charges $OT Visit: 1 Visit OT Evaluation $OT Eval Low Complexity: 1 Low OT Treatments $Self Care/Home Management : 23-37 mins  Jeni Salles, MPH, MS, OTR/L ascom [phone] 09/19/18, 10:26 AM

## 2018-09-19 NOTE — Progress Notes (Signed)
Subjective:  POD #1 s/p right total knee arthroplasty.   Patient reports right knee pain as mild to moderate.  Patient is up out of bed to a chair.  She denies chest pain.  She states she had mild shortness of breath when getting up with physical therapy but otherwise is not short of breath.  She has tolerated a p.o. diet denies any nausea or vomiting.  Objective:   VITALS:   Vitals:   09/19/18 0413 09/19/18 0751 09/19/18 1035 09/19/18 1244  BP: (!) 108/51 122/73  130/67  Pulse: 74 69  77  Resp: 16 18  18   Temp: 98.2 F (36.8 C) 98.9 F (37.2 C)  98.7 F (37.1 C)  TempSrc: Oral   Oral  SpO2: 95% 97% 96% 97%  Weight:      Height:        PHYSICAL EXAM: Right lower extremity: I removed the patient's outer bandages today.  I have left the honeycomb dressing in place.  She has mild spotting on the honeycomb dressing but no evidence of active bleeding.  Her swelling is well controlled.  She has no calf tenderness.  Distally she is neurovascular intact. Neurovascular intact Sensation intact distally Intact pulses distally Dorsiflexion/Plantar flexion intact Incision: scant drainage No cellulitis present Compartment soft  LABS  Results for orders placed or performed during the hospital encounter of 09/18/18 (from the past 24 hour(s))  CBC     Status: Abnormal   Collection Time: 09/19/18  3:24 AM  Result Value Ref Range   WBC 11.3 (H) 4.0 - 10.5 K/uL   RBC 3.82 (L) 3.87 - 5.11 MIL/uL   Hemoglobin 11.3 (L) 12.0 - 15.0 g/dL   HCT 40.935.7 (L) 81.136.0 - 91.446.0 %   MCV 93.5 80.0 - 100.0 fL   MCH 29.6 26.0 - 34.0 pg   MCHC 31.7 30.0 - 36.0 g/dL   RDW 78.214.7 95.611.5 - 21.315.5 %   Platelets 196 150 - 400 K/uL   nRBC 0.0 0.0 - 0.2 %  Basic metabolic panel     Status: Abnormal   Collection Time: 09/19/18  3:24 AM  Result Value Ref Range   Sodium 138 135 - 145 mmol/L   Potassium 3.6 3.5 - 5.1 mmol/L   Chloride 107 98 - 111 mmol/L   CO2 25 22 - 32 mmol/L   Glucose, Bld 129 (H) 70 - 99 mg/dL   BUN 20 6 - 20 mg/dL   Creatinine, Ser 0.860.78 0.44 - 1.00 mg/dL   Calcium 7.9 (L) 8.9 - 10.3 mg/dL   GFR calc non Af Amer >60 >60 mL/min   GFR calc Af Amer >60 >60 mL/min   Anion gap 6 5 - 15    Dg Knee Right Port  Result Date: 09/18/2018 CLINICAL DATA:  Osteoarthritis of the right knee. EXAM: PORTABLE RIGHT KNEE - 1-2 VIEW COMPARISON:  None. FINDINGS: The patient has undergone right total knee replacement. The components of the prosthesis appear in excellent position. No fractures. Expected postsurgical air in fluid. IMPRESSION: Satisfactory appearance of the right knee after total knee replacement. Electronically Signed   By: Francene BoyersJames  Maxwell M.D.   On: 09/18/2018 13:02    Assessment/Plan: 1 Day Post-Op   Active Problems:   S/P TKR (total knee replacement) using cement, right  Continue physical therapy for knee range of motion, gait training and lower extremity strengthening.  Patient may weight-bear as tolerated on the right lower extremity.  Continue to use Polar Care to reduce swelling.  Continue use knee immobilizer to help with knee extension.  Keep a towel roll under the patient's right heel.  Patient is beginning Lovenox today for DVT prophylaxis.  Patient will likely be discharged home in the next 24 to 48 hours.    Thornton Park , MD 09/19/2018, 1:37 PM

## 2018-09-19 NOTE — Progress Notes (Signed)
Physical Therapy Treatment Patient Details Name: Jessica Brennan MRN: [id number] DOB: 1965/06/11 Today's Date: 09/19/2018    History of Present Illness 53 y.o. female admitted on 09/18/2018 now s/p R TKA. Dx R knee osteoarthirits. PMH: anxiety, obesity,Diffuse cystic mastopathy, dysthymic disorder, hypertension, hypothyroidism. Past Surgies: L ACL, abdomimal hysterectomy, cholecystectomy.    PT Comments    Pt received supine at bed and agreeable to PT session. Supervision for supine to sitting on EOB from flatted bed.  CGA for ambulation with RW for 260 feet. Vc's provided for RW management with body positioning. Lack of knee extension on R LE noted. Able to self correct with vc's but needs reinforcement. Pt reports she has been walking for too long with mild knee flexion on R LE. Pt reports increased pain noted with mobility from mild to moderate but able to tolerate well. SOB noted at the end of the session and able to recover 1 min sitting. SpO2 is 96 at the end of the session sitting on recliner. R knee: flexion 98 degrees and 10 degrees lack of neutral in extension. Will attempt stairs this afternoon as appropriate. Pt reports OPPT has been scheduled. Will benefit from PT to improve ROM, muscle endurance, and strength for a safe return home.    Follow Up Recommendations  Outpatient PT     Equipment Recommendations  Rolling walker with 5" wheels;3in1 (PT)    Recommendations for Other Services OT consult     Precautions / Restrictions Precautions Precautions: Knee;Fall Precaution Booklet Issued: Yes (comment) Required Braces or Orthoses: Knee Immobilizer - Right Knee Immobilizer - Right: On except when in CPM Restrictions Weight Bearing Restrictions: Yes RLE Weight Bearing: Weight bearing as tolerated    Mobility  Bed Mobility Overal bed mobility: Needs Assistance Bed Mobility: Supine to Sit     Supine to sit: Supervision     General bed mobility comments: Bed flated  and able to supine to sitting on EOB without using bed railings.  Transfers Overall transfer level: Needs assistance Equipment used: Rolling walker (2 wheeled) Transfers: Sit to/from Stand Sit to Stand: Min guard         General transfer comment: Vc's given for hand placemenst when standing from the EOB. Pt able to follow instructions and perfrom safe transfer from sitting on EOB.  Ambulation/Gait Ambulation/Gait assistance: Min guard Gait Distance (Feet): 260 Feet Assistive device: Rolling walker (2 wheeled) Gait Pattern/deviations: Decreased stance time - right;Decreased step length - right Gait velocity: Decreased   General Gait Details: Vc's provided to maintain hand and body positioning with RW management. R knee flexion presents in R LE stance phase. Vc's for knee extension and quad activation given. Pt able to correct within the session. Needs reinforcement for extension on R knee   Stairs             Wheelchair Mobility    Modified Rankin (Stroke Patients Only)       Balance Overall balance assessment: Needs assistance Sitting-balance support: No upper extremity supported;Feet supported Sitting balance-Leahy Scale: Good Sitting balance - Comments: steady sitting reaching within BOS   Standing balance support: Bilateral upper extremity supported Standing balance-Leahy Scale: Fair Standing balance comment: Able to maintain balance with BUE support on RW                            Cognition Arousal/Alertness: Awake/alert Behavior During Therapy: WFL for tasks assessed/performed Overall Cognitive Status: Within Functional Limits for tasks assessed  Exercises Total Joint Exercises Quad Sets: AROM;Right;10 reps;Supine Knee Flexion: AROM;Right;10 reps;Seated Goniometric ROM: R Knee extension 10 degree off neutral; R knee flexion 98 degrees General Exercises - Lower Extremity Straight Leg  Raises: AROM;Right;10 reps;Seated Other Exercises Other Exercises: pt instructed in polar care mgt, pet care considerations and falls prevention, AE/DME, and home/routines modifications to maximize safety/independence; handout provided    General Comments General comments (skin integrity, edema, etc.): Polar care and knee stabilizer activated at beginning and re-attached at the end of the session.      Pertinent Vitals/Pain Pain Assessment: 0-10 Pain Score: 4  Pain Location: R knee Pain Descriptors / Indicators: Aching Pain Intervention(s): Limited activity within patient's tolerance;Monitored during session;Repositioned(Polar care activated)    Home Living Family/patient expects to be discharged to:: Private residence Living Arrangements: Alone Available Help at Discharge: Family;Available 24 hours/day(sister for a couple weeks) Type of Home: House Home Access: Level entry;Stairs to enter Entrance Stairs-Rails: Can reach both Home Layout: One level Home Equipment: Cane - single point      Prior Function Level of Independence: Independent      Comments: ambulate with cane as baseline   PT Goals (current goals can now be found in the care plan section) Acute Rehab PT Goals Patient Stated Goal: to go home PT Goal Formulation: With patient Time For Goal Achievement: 10/03/18 Potential to Achieve Goals: Good Progress towards PT goals: Progressing toward goals    Frequency    BID      PT Plan      Co-evaluation              AM-PAC PT "6 Clicks" Mobility   Outcome Measure  Help needed turning from your back to your side while in a flat bed without using bedrails?: A Little Help needed moving from lying on your back to sitting on the side of a flat bed without using bedrails?: A Little Help needed moving to and from a bed to a chair (including a wheelchair)?: A Little Help needed standing up from a chair using your arms (e.g., wheelchair or bedside chair)?: A  Little Help needed to walk in hospital room?: A Little Help needed climbing 3-5 steps with a railing? : A Little 6 Click Score: 18    End of Session Equipment Utilized During Treatment: Gait belt;Right knee immobilizer Activity Tolerance: Patient tolerated treatment well;Patient limited by pain Patient left: in chair;with SCD's reapplied;in CPM;with call bell/phone within reach;with chair alarm set Nurse Communication: Weight bearing status;Mobility status;Precautions PT Visit Diagnosis: Muscle weakness (generalized) (M62.81);Other abnormalities of gait and mobility (R26.89);Pain;Difficulty in walking, not elsewhere classified (R26.2) Pain - Right/Left: Right Pain - part of body: Knee     Time: [id number] PT Time Calculation (min) (ACUTE ONLY): 39 min  Charges:                         Nelly RoutNan Kischa Altice, SPT 09/19/2018, 10:46 AM

## 2018-09-19 NOTE — Anesthesia Postprocedure Evaluation (Signed)
Anesthesia Post Note  Patient: Jessica Brennan  Procedure(s) Performed: TOTAL KNEE ARTHROPLASTY (Right )  Patient location during evaluation: Nursing Unit Anesthesia Type: Spinal Level of consciousness: awake, awake and alert, patient cooperative and oriented Pain management: pain level controlled Vital Signs Assessment: post-procedure vital signs reviewed and stable Respiratory status: spontaneous breathing, nonlabored ventilation and respiratory function stable Cardiovascular status: stable Postop Assessment: no headache, no backache, adequate PO intake, patient able to bend at knees and no apparent nausea or vomiting Anesthetic complications: no     Last Vitals:  Vitals:   09/18/18 2331 09/19/18 0413  BP: 129/70 (!) 108/51  Pulse: 78 74  Resp: 15 16  Temp: 36.9 C 36.8 C  SpO2: 97% 95%    Last Pain:  Vitals:   09/19/18 0413  TempSrc: Oral  PainSc:                  Ricki Miller

## 2018-09-20 DIAGNOSIS — Z515 Encounter for palliative care: Secondary | ICD-10-CM

## 2018-09-20 LAB — CBC
HCT: 34.4 % — ABNORMAL LOW (ref 36.0–46.0)
Hemoglobin: 11 g/dL — ABNORMAL LOW (ref 12.0–15.0)
MCH: 29.5 pg (ref 26.0–34.0)
MCHC: 32 g/dL (ref 30.0–36.0)
MCV: 92.2 fL (ref 80.0–100.0)
Platelets: 185 10*3/uL (ref 150–400)
RBC: 3.73 MIL/uL — ABNORMAL LOW (ref 3.87–5.11)
RDW: 14.7 % (ref 11.5–15.5)
WBC: 12.4 10*3/uL — ABNORMAL HIGH (ref 4.0–10.5)
nRBC: 0 % (ref 0.0–0.2)

## 2018-09-20 MED ORDER — OXYCODONE HCL 5 MG PO TABS: 5.0000 mg | ORAL_TABLET | ORAL | 0 refills | Status: DC | PRN

## 2018-09-20 MED ORDER — METHOCARBAMOL 500 MG PO TABS: 500.0000 mg | ORAL_TABLET | Freq: Four times a day (QID) | ORAL | 0 refills | Status: DC | PRN

## 2018-09-20 MED ORDER — DOCUSATE SODIUM 100 MG PO CAPS: 100.0000 mg | ORAL_CAPSULE | Freq: Two times a day (BID) | ORAL | 0 refills | Status: DC

## 2018-09-20 MED ORDER — ENOXAPARIN SODIUM 40 MG/0.4ML ~~LOC~~ SOLN: 40.0000 mg | SUBCUTANEOUS | 0 refills | Status: DC

## 2018-09-20 NOTE — Progress Notes (Signed)
DISCHARGE NOTE:  Pt given discharge instructions. Pt verbalized understanding. DME ( walker, BSC) sent with pt, Pt wheeled to car by staff.

## 2018-09-20 NOTE — Progress Notes (Signed)
  Subjective:  Patient reports pain as moderate.  Did well with PT this AM.  Objective:   VITALS:   Vitals:   09/19/18 1244 09/19/18 1510 09/19/18 2321 09/20/18 0815  BP: 130/67 (!) 148/77 125/63 119/64  Pulse: 77 77 91 82  Resp: 18 18 20 18   Temp: 98.7 F (37.1 C) 99 F (37.2 C) 99.6 F (37.6 C) 99.4 F (37.4 C)  TempSrc: Oral Oral Oral   SpO2: 97% 98% 94% 98%  Weight:      Height:        PHYSICAL EXAM:  Neurologically intact ABD soft Neurovascular intact Sensation intact distally Intact pulses distally Dorsiflexion/Plantar flexion intact Incision: dressing changed No cellulitis present Compartment soft  LABS  Results for orders placed or performed during the hospital encounter of 09/18/18 (from the past 24 hour(s))  CBC     Status: Abnormal   Collection Time: 09/20/18  4:20 AM  Result Value Ref Range   WBC 12.4 (H) 4.0 - 10.5 K/uL   RBC 3.73 (L) 3.87 - 5.11 MIL/uL   Hemoglobin 11.0 (L) 12.0 - 15.0 g/dL   HCT 34.4 (L) 36.0 - 46.0 %   MCV 92.2 80.0 - 100.0 fL   MCH 29.5 26.0 - 34.0 pg   MCHC 32.0 30.0 - 36.0 g/dL   RDW 14.7 11.5 - 15.5 %   Platelets 185 150 - 400 K/uL   nRBC 0.0 0.0 - 0.2 %    Dg Knee Right Port  Result Date: 09/18/2018 CLINICAL DATA:  Osteoarthritis of the right knee. EXAM: PORTABLE RIGHT KNEE - 1-2 VIEW COMPARISON:  None. FINDINGS: The patient has undergone right total knee replacement. The components of the prosthesis appear in excellent position. No fractures. Expected postsurgical air in fluid. IMPRESSION: Satisfactory appearance of the right knee after total knee replacement. Electronically Signed   By: Lorriane Shire M.D.   On: 09/18/2018 13:02    Assessment/Plan: 2 Days Post-Op   Active Problems:   S/P TKR (total knee replacement) using cement, right   Up with therapy  Continue physical therapy for knee range of motion, gait training and lower extremity strengthening.  Patient may weight-bear as tolerated on the right lower  extremity.  Continue to use Polar Care to reduce swelling.  Continue use knee immobilizer to help with knee extension.  Keep a towel roll under the patient's right heel.  Patient is beginning Lovenox today for DVT prophylaxis.  Discharge home today     Carlynn Spry , PA-C 09/20/2018, 10:20 AM

## 2018-09-20 NOTE — Discharge Summary (Signed)
Physician Discharge Summary  Patient ID: Jessica Brennan MRN: [id number] DOB/AGE: November 06, 1965 53 y.o.  Admit date: 09/18/2018 Discharge date: 09/20/2018  Admission Diagnoses:  M17.11 unilateral primary osteoarthirits right knee <principal problem not specified>  Discharge Diagnoses:  M17.11 unilateral primary osteoarthirits right knee Active Problems:   S/P TKR (total knee replacement) using cement, right   Hospice care patient   Palliative care patient   Past Medical History:  Diagnosis Date  . Anxiety   . Asthma   . Diffuse cystic mastopathy   . Dysthymic disorder   . Hypertension   . Hypothyroidism   . Menopausal state   . Obesity   . Vitamin D deficiency     Surgeries: Procedure(s): TOTAL KNEE ARTHROPLASTY on 09/18/2018   Consultants (if any):   Discharged Condition: Improved  Hospital Course: Jessica Brennan is an 53 y.o. female who was admitted 09/18/2018 with a diagnosis of  M17.11 unilateral primary osteoarthirits right knee <principal problem not specified> and went to the operating room on 09/18/2018 and underwent the above named procedures.    She was given perioperative antibiotics:  Anti-infectives (From admission, onward)   Start     Dose/Rate Route Frequency Ordered Stop   09/18/18 1700  clindamycin (CLEOCIN) IVPB 600 mg     600 mg 100 mL/hr over 30 Minutes Intravenous Every 8 hours 09/18/18 1326 09/19/18 0215   09/18/18 1430  ceFAZolin (ANCEF) IVPB 1 g/50 mL premix     1 g 100 mL/hr over 30 Minutes Intravenous Every 6 hours 09/18/18 1326 09/18/18 2205   09/18/18 0600  ceFAZolin (ANCEF) IVPB 2g/100 mL premix     2 g 200 mL/hr over 30 Minutes Intravenous On call to O.R. 09/17/18 2147 09/18/18 4742    .  She was given sequential compression devices, early ambulation, and Lovenox for DVT prophylaxis.  She benefited maximally from the hospital stay and there were no complications.    Recent vital signs:  Vitals:   09/19/18 2321 09/20/18 0815   BP: 125/63 119/64  Pulse: 91 82  Resp: 20 18  Temp: 99.6 F (37.6 C) 99.4 F (37.4 C)  SpO2: 94% 98%    Recent laboratory studies:  Lab Results  Component Value Date   HGB 11.0 (L) 09/20/2018   HGB 11.3 (L) 09/19/2018   HGB 14.6 09/15/2018   Lab Results  Component Value Date   WBC 12.4 (H) 09/20/2018   PLT 185 09/20/2018   Lab Results  Component Value Date   INR 1.0 09/15/2018   Lab Results  Component Value Date   NA 138 09/19/2018   K 3.6 09/19/2018   CL 107 09/19/2018   CO2 25 09/19/2018   BUN 20 09/19/2018   CREATININE 0.78 09/19/2018   GLUCOSE 129 (H) 09/19/2018    Discharge Medications:   Allergies as of 09/20/2018      Reactions   Codeine Sulfate Hives, Other (See Comments)   Stomachache, GI upset, welts   Other Other (See Comments)   Mold- Sinus infections   Pollen Extract Other (See Comments)   sneezing      Medication List    STOP taking these medications   ibuprofen 200 MG tablet Commonly known as: ADVIL     TAKE these medications   acetaminophen 325 MG tablet Commonly known as: TYLENOL Take 650 mg by mouth every 6 (six) hours as needed.   albuterol 108 (90 Base) MCG/ACT inhaler Commonly known as: VENTOLIN HFA Inhale 2 puffs into the lungs every 6 (six)  hours as needed for wheezing or shortness of breath.   amLODipine 5 MG tablet Commonly known as: NORVASC TAKE 1 TABLET BY MOUTH EVERY DAY   atorvastatin 20 MG tablet Commonly known as: LIPITOR Take 1 tablet (20 mg total) by mouth daily. What changed: when to take this   docusate sodium 100 MG capsule Commonly known as: COLACE Take 1 capsule (100 mg total) by mouth 2 (two) times daily.   enoxaparin 40 MG/0.4ML injection Commonly known as: LOVENOX Inject 0.4 mLs (40 mg total) into the skin daily.   levothyroxine 75 MCG tablet Commonly known as: SYNTHROID TAKE 1 TABLET (75 MCG TOTAL) BY MOUTH DAILY BEFORE BREAKFAST.   loratadine 10 MG tablet Commonly known as: CLARITIN Take  10 mg by mouth daily as needed for allergies.   methocarbamol 500 MG tablet Commonly known as: ROBAXIN Take 1 tablet (500 mg total) by mouth every 6 (six) hours as needed for muscle spasms.   oxyCODONE 5 MG immediate release tablet Commonly known as: Oxy IR/ROXICODONE Take 1-2 tablets (5-10 mg total) by mouth every 4 (four) hours as needed for moderate pain (pain score 4-6).   sertraline 100 MG tablet Commonly known as: ZOLOFT Take 150 mg by mouth daily.   venlafaxine XR 75 MG 24 hr capsule Commonly known as: EFFEXOR-XR Take 3 capsules (225 mg total) by mouth daily with breakfast. DX: F32.2 What changed: how much to take       Diagnostic Studies: Chest 2 View  Result Date: 09/15/2018 CLINICAL DATA:  Preop clearance for knee replacement. EXAM: CHEST - 2 VIEW COMPARISON:  06/01/2017 FINDINGS: Lungs are adequately inflated and otherwise clear. Cardiomediastinal silhouette and remainder of the exam is unchanged. IMPRESSION: No active cardiopulmonary disease. Electronically Signed   By: Elberta Fortisaniel  Boyle M.D.   On: 09/15/2018 19:27   Dg Knee Right Port  Result Date: 09/18/2018 CLINICAL DATA:  Osteoarthritis of the right knee. EXAM: PORTABLE RIGHT KNEE - 1-2 VIEW COMPARISON:  None. FINDINGS: The patient has undergone right total knee replacement. The components of the prosthesis appear in excellent position. No fractures. Expected postsurgical air in fluid. IMPRESSION: Satisfactory appearance of the right knee after total knee replacement. Electronically Signed   By: Francene BoyersJames  Maxwell M.D.   On: 09/18/2018 13:02    Disposition:        Signed: Altamese CabalMaurice Kapil Petropoulos ,PA-C 09/20/2018, 10:31 AM

## 2018-09-20 NOTE — Discharge Instructions (Signed)
Continue weight bear as tolerated on the right lower extremity.    Elevate the right lower extremity whenever possible and continue the polar care while elevating the extremity. Patient may shower. No bath or submerging the wound.    Use knee immobilizer at night   Take Lovenox as directed for blood clot prevention.  Continue to work on knee range of motion exercises at home as instructed by physical therapy. Continue to use a walker for assistance with ambulation until cleared by physical therapy.  Call [phone] with any questions, such as fever > 101.5 degrees, drainage from the wound or shortness of breath.

## 2018-09-20 NOTE — Progress Notes (Signed)
Physical Therapy Treatment Patient Details Name: Jessica Brennan MRN: [id number] DOB: March 02, 1965 Today's Date: 09/20/2018    History of Present Illness 53 y.o. female admitted on 09/18/2018 now s/p R TKA. Dx R knee osteoarthirits. PMH: anxiety, obesity,Diffuse cystic mastopathy, dysthymic disorder, hypertension, hypothyroidism. Past Surgies: L ACL, abdomimal hysterectomy, cholecystectomy.    PT Comments    Participated in exercises as described below.  Bed mobility with min guard/assist for comfort and to position.  Pt was able to ambulate to rehab gym for stair training with bilateral rails with min guard.  After seated rest, was able to walk 160' total with walker and min guard.  Verbal cues to correct gait pattern as she initially was stepping too close to walker and upon return to room hurried to bed due to fatigue.  Discussed safety issues with both and she was able to correct gait pattern and voice understanding of safety/fall risk with hurried gait.  No further questions or concerns voiced and anxious for discharge home.   Follow Up Recommendations  Outpatient PT     Equipment Recommendations  Rolling walker with 5" wheels;3in1 (PT)    Recommendations for Other Services       Precautions / Restrictions Precautions Precautions: Knee;Fall Required Braces or Orthoses: Knee Immobilizer - Right Knee Immobilizer - Right: On except when in CPM Restrictions Weight Bearing Restrictions: Yes RLE Weight Bearing: Weight bearing as tolerated    Mobility  Bed Mobility Overal bed mobility: Needs Assistance Bed Mobility: Supine to Sit;Sit to Supine     Supine to sit: Min guard Sit to supine: Min assist      Transfers Overall transfer level: Needs assistance Equipment used: Rolling walker (2 wheeled) Transfers: Sit to/from Stand              Ambulation/Gait Ambulation/Gait assistance: Min guard Gait Distance (Feet): 160 Feet Assistive device: Rolling walker (2  wheeled) Gait Pattern/deviations: Step-to pattern Gait velocity: Decreased   General Gait Details: Verbal and demo of proper gait sequence and walker position.  voiced understanding.   Stairs Stairs: Yes Stairs assistance: Min guard Stair Management: Two rails;Step to pattern Number of Stairs: 4 General stair comments: with ease and good sequence   Wheelchair Mobility    Modified Rankin (Stroke Patients Only)       Balance Overall balance assessment: Needs assistance Sitting-balance support: No upper extremity supported;Feet supported Sitting balance-Leahy Scale: Good     Standing balance support: Bilateral upper extremity supported Standing balance-Leahy Scale: Fair Standing balance comment: Able to maintain balance with BUE support on RW                            Cognition Arousal/Alertness: Awake/alert Behavior During Therapy: WFL for tasks assessed/performed Overall Cognitive Status: Within Functional Limits for tasks assessed                                        Exercises Total Joint Exercises Ankle Circles/Pumps: AROM;Right;10 reps;Supine Quad Sets: AROM;Right;10 reps;Supine Gluteal Sets: AROM;Right;10 reps;Supine Short Arc Quad: AROM;Right;10 reps;Supine Heel Slides: AROM;Right;10 reps;Supine Hip ABduction/ADduction: AROM;Right;10 reps;Supine Long Arc Quad: AROM;Right;10 reps;Seated Knee Flexion: AROM;Right;10 reps;Seated Goniometric ROM: 7-90 Other Exercises Other Exercises: gait sequencing and safety    General Comments        Pertinent Vitals/Pain Pain Assessment: 0-10 Pain Score: 2  Pain Descriptors / Indicators: Operative site  guarding;Sore Pain Intervention(s): Limited activity within patient's tolerance;Monitored during session;Premedicated before session;Ice applied    Home Living                      Prior Function            PT Goals (current goals can now be found in the care plan section)  Progress towards PT goals: Progressing toward goals    Frequency           PT Plan Current plan remains appropriate    Co-evaluation              AM-PAC PT "6 Clicks" Mobility   Outcome Measure  Help needed turning from your back to your side while in a flat bed without using bedrails?: A Little Help needed moving from lying on your back to sitting on the side of a flat bed without using bedrails?: A Little Help needed moving to and from a bed to a chair (including a wheelchair)?: A Little Help needed standing up from a chair using your arms (e.g., wheelchair or bedside chair)?: A Little Help needed to walk in hospital room?: A Little Help needed climbing 3-5 steps with a railing? : A Little 6 Click Score: 18    End of Session Equipment Utilized During Treatment: Gait belt;Right knee immobilizer Activity Tolerance: Patient tolerated treatment well;Patient limited by fatigue     Pain - Right/Left: Right Pain - part of body: Knee     Time: 6568-1275 PT Time Calculation (min) (ACUTE ONLY): 21 min  Charges:  $Gait Training: 8-22 mins                    Chesley Noon, PTA 09/20/18, 10:52 AM

## 2018-10-27 ENCOUNTER — Other Ambulatory Visit: Payer: Self-pay | Admitting: Orthopedic Surgery

## 2018-11-08 ENCOUNTER — Other Ambulatory Visit: Payer: Self-pay | Admitting: Nurse Practitioner

## 2018-11-08 DIAGNOSIS — E039 Hypothyroidism, unspecified: Secondary | ICD-10-CM

## 2018-11-13 ENCOUNTER — Encounter
Admission: RE | Admit: 2018-11-13 | Discharge: 2018-11-13 | Disposition: A | Discharge status: Home / Self Care | Payer: BC Managed Care – PPO | Source: Ambulatory Visit | Attending: Orthopedic Surgery | Admitting: Orthopedic Surgery

## 2018-11-13 ENCOUNTER — Other Ambulatory Visit: Payer: Self-pay

## 2018-11-13 DIAGNOSIS — Z01812 Encounter for preprocedural laboratory examination: Secondary | ICD-10-CM | POA: Diagnosis present

## 2018-11-13 LAB — CBC WITH DIFFERENTIAL/PLATELET
Abs Immature Granulocytes: 0.04 10*3/uL (ref 0.00–0.07)
Basophils Absolute: 0 10*3/uL (ref 0.0–0.1)
Basophils Relative: 0 %
Eosinophils Absolute: 0.2 10*3/uL (ref 0.0–0.5)
Eosinophils Relative: 2 %
HCT: 43.9 % (ref 36.0–46.0)
Hemoglobin: 14.1 g/dL (ref 12.0–15.0)
Immature Granulocytes: 0 %
Lymphocytes Relative: 22 %
Lymphs Abs: 2.3 10*3/uL (ref 0.7–4.0)
MCH: 28.7 pg (ref 26.0–34.0)
MCHC: 32.1 g/dL (ref 30.0–36.0)
MCV: 89.4 fL (ref 80.0–100.0)
Monocytes Absolute: 0.6 10*3/uL (ref 0.1–1.0)
Monocytes Relative: 6 %
Neutro Abs: 7.5 10*3/uL (ref 1.7–7.7)
Neutrophils Relative %: 70 %
Platelets: 233 10*3/uL (ref 150–400)
RBC: 4.91 MIL/uL (ref 3.87–5.11)
RDW: 14.8 % (ref 11.5–15.5)
WBC: 10.7 10*3/uL — ABNORMAL HIGH (ref 4.0–10.5)
nRBC: 0 % (ref 0.0–0.2)

## 2018-11-13 LAB — SURGICAL PCR SCREEN
MRSA, PCR: NEGATIVE
Staphylococcus aureus: NEGATIVE

## 2018-11-13 LAB — BASIC METABOLIC PANEL
Anion gap: 12 (ref 5–15)
BUN: 12 mg/dL (ref 6–20)
CO2: 27 mmol/L (ref 22–32)
Calcium: 9.3 mg/dL (ref 8.9–10.3)
Chloride: 97 mmol/L — ABNORMAL LOW (ref 98–111)
Creatinine, Ser: 0.8 mg/dL (ref 0.44–1.00)
GFR calc Af Amer: >60 mL/min (ref >60)
GFR calc non Af Amer: >60 mL/min (ref >60)
Glucose, Bld: 100 mg/dL — ABNORMAL HIGH (ref 70–99)
Potassium: 3.9 mmol/L (ref 3.5–5.1)
Sodium: 136 mmol/L (ref 135–145)

## 2018-11-13 LAB — TYPE AND SCREEN
ABO/RH(D): A POS
Antibody Screen: NEGATIVE

## 2018-11-13 LAB — PROTIME-INR
INR: 1 (ref 0.8–1.2)
Prothrombin Time: 13.1 seconds (ref 11.4–15.2)

## 2018-11-13 LAB — APTT: aPTT: 24 seconds (ref 24–36)

## 2018-11-13 NOTE — Patient Instructions (Signed)
Your procedure is scheduled on: 11/20/2018 Thurs Report to Same Day Surgery 2nd floor medical mall [facility](Medical Mall Entrance-take elevator on left to 2nd floor.  Check in with surgery information desk.) To find out your arrival time please call [phone] between 1PM - 3PM on 11/19/2018 Wed  Remember: Instructions that are not followed completely may result in serious medical risk, up to and including death, or upon the discretion of your surgeon and anesthesiologist your surgery may need to be rescheduled.    _x___ 1. Do not eat food after midnight the night before your procedure. You may drink clear liquids up to 2 hours before you are scheduled to arrive at the hospital for your procedure.  Do not drink clear liquids within 2 hours of your scheduled arrival to the hospital.  Clear liquids include  --Water or Apple juice without pulp  --Clear carbohydrate beverage such as ClearFast or Gatorade  --Black Coffee or Clear Tea (No milk, no creamers, do not add anything to                  the coffee or Tea Type 1 and type 2 diabetics should only drink water.   ____Ensure clear carbohydrate drink on the way to the hospital for bariatric patients  ____Ensure clear carbohydrate drink 3 hours before surgery.   No gum chewing or hard candies.     __x__ 2. No Alcohol for 24 hours before or after surgery.   __x__3. No Smoking or e-cigarettes for 24 prior to surgery.  Do not use any chewable tobacco products for at least 6 hour prior to surgery   ____  4. Bring all medications with you on the day of surgery if instructed.    __x__ 5. Notify your doctor if there is any change in your medical condition     (cold, fever, infections).    x___6. On the morning of surgery brush your teeth with toothpaste and water.  You may rinse your mouth with mouth wash if you wish.  Do not swallow any toothpaste or mouthwash.   Do not wear jewelry, make-up, hairpins, clips or nail polish.  Do not wear lotions,  powders, or perfumes. You may wear deodorant.  Do not shave 48 hours prior to surgery. Men may shave face and neck.  Do not bring valuables to the hospital.    [facility] is not responsible for any belongings or valuables.               Contacts, dentures or bridgework may not be worn into surgery.  Leave your suitcase in the car. After surgery it may be brought to your room.  For patients admitted to the hospital, discharge time is determined by your                       treatment team.  _  Patients discharged the day of surgery will not be allowed to drive home.  You will need someone to drive you home and stay with you the night of your procedure.    Please read over the following fact sheets that you were given:   [facility] Pre[facility] for [facility] and or MRSA Information   _x___ Take anti-hypertensive listed below, cardiac, seizure, asthma,     anti-reflux and psychiatric medicines. These include:  1. amLODipine (NORVASC) 5 MG tablet  2.albuterol (PROVENTIL HFA;VENTOLIN HFA) 108 (90 Base) MCG/ACT inhaler if needed and bring to hospital  3.levothyroxine (SYNTHROID) 75 MCG  tablet  4.sertraline (ZOLOFT) 100 MG tablet  5.  6.  ____Fleets enema or Magnesium Citrate as directed.   _x___ Use CHG Soap or sage wipes as directed on instruction sheet   _x___ Use inhalers on the day of surgery and bring to hospital day of surgery  ____ Stop Metformin and Janumet 2 days prior to surgery.    ____ Take 1/2 of usual insulin dose the night before surgery and none on the morning     surgery.   _x___ Follow recommendations from Cardiologist, Pulmonologist or PCP regarding          stopping Aspirin, Coumadin, Plavix ,Eliquis, Effient, or Pradaxa, and Pletal.  X____Stop Anti-inflammatories such as Advil, Aleve, Ibuprofen, Motrin, Naproxen, Naprosyn, Goodies powders or aspirin products. OK to take Tylenol and                          Celebrex.   _x___ Stop supplements until after surgery.  But  may continue Vitamin D, Vitamin B,       and multivitamin.   ____ Bring C-Pap to the hospital.

## 2018-11-17 ENCOUNTER — Other Ambulatory Visit
Admission: RE | Admit: 2018-11-17 | Discharge: 2018-11-17 | Disposition: A | Discharge status: Home / Self Care | Payer: BC Managed Care – PPO | Source: Ambulatory Visit | Attending: Orthopedic Surgery | Admitting: Orthopedic Surgery

## 2018-11-17 ENCOUNTER — Other Ambulatory Visit: Payer: Self-pay

## 2018-11-17 DIAGNOSIS — Z01812 Encounter for preprocedural laboratory examination: Secondary | ICD-10-CM | POA: Insufficient documentation

## 2018-11-17 DIAGNOSIS — Z20828 Contact with and (suspected) exposure to other viral communicable diseases: Secondary | ICD-10-CM | POA: Insufficient documentation

## 2018-11-17 LAB — SARS CORONAVIRUS 2 (TAT 6-24 HRS): SARS Coronavirus 2: NEGATIVE

## 2018-11-19 MED ORDER — TRANEXAMIC ACID 1000 MG/10ML IV SOLN
1.5000 mg/kg/h | INTRAVENOUS | Status: AC
  Administered 2018-11-20: 2500 mg via INTRAVENOUS
  Filled 2018-11-19: qty 25

## 2018-11-19 MED ORDER — CLINDAMYCIN PHOSPHATE 600 MG/50ML IV SOLN
600.0000 mg | Freq: Once | INTRAVENOUS | Status: AC
  Administered 2018-11-20: 600 mg via INTRAVENOUS

## 2018-11-19 MED ORDER — CEFAZOLIN SODIUM-DEXTROSE 2-4 GM/100ML-% IV SOLN
2.0000 g | INTRAVENOUS | Status: AC
  Administered 2018-11-20: 2 g via INTRAVENOUS

## 2018-11-20 ENCOUNTER — Inpatient Hospital Stay: Payer: BC Managed Care – PPO | Admitting: Certified Registered Nurse Anesthetist

## 2018-11-20 ENCOUNTER — Other Ambulatory Visit: Payer: Self-pay

## 2018-11-20 ENCOUNTER — Encounter
Admission: RE | Disposition: A | Discharge status: Home Health | Payer: Self-pay | Source: Home / Self Care | Attending: Orthopedic Surgery

## 2018-11-20 ENCOUNTER — Encounter: Payer: Self-pay | Admitting: *Deleted

## 2018-11-20 ENCOUNTER — Inpatient Hospital Stay: Payer: BC Managed Care – PPO

## 2018-11-20 ENCOUNTER — Inpatient Hospital Stay
Admission: RE | Admit: 2018-11-20 | Discharge: 2018-11-22 | DRG: 470 | Disposition: A | Discharge status: Home Health | Payer: BC Managed Care – PPO | Attending: Orthopedic Surgery | Admitting: Orthopedic Surgery

## 2018-11-20 DIAGNOSIS — Z23 Encounter for immunization: Secondary | ICD-10-CM

## 2018-11-20 DIAGNOSIS — F419 Anxiety disorder, unspecified: Secondary | ICD-10-CM | POA: Diagnosis present

## 2018-11-20 DIAGNOSIS — J45909 Unspecified asthma, uncomplicated: Secondary | ICD-10-CM | POA: Diagnosis present

## 2018-11-20 DIAGNOSIS — Z6841 Body Mass Index (BMI) 40.0 and over, adult: Secondary | ICD-10-CM | POA: Diagnosis not present

## 2018-11-20 DIAGNOSIS — I10 Essential (primary) hypertension: Secondary | ICD-10-CM | POA: Diagnosis present

## 2018-11-20 DIAGNOSIS — Z885 Allergy status to narcotic agent status: Secondary | ICD-10-CM

## 2018-11-20 DIAGNOSIS — M25762 Osteophyte, left knee: Secondary | ICD-10-CM | POA: Diagnosis present

## 2018-11-20 DIAGNOSIS — Z79890 Hormone replacement therapy (postmenopausal): Secondary | ICD-10-CM | POA: Diagnosis not present

## 2018-11-20 DIAGNOSIS — E669 Obesity, unspecified: Secondary | ICD-10-CM | POA: Diagnosis present

## 2018-11-20 DIAGNOSIS — Z79899 Other long term (current) drug therapy: Secondary | ICD-10-CM

## 2018-11-20 DIAGNOSIS — E039 Hypothyroidism, unspecified: Secondary | ICD-10-CM | POA: Diagnosis present

## 2018-11-20 DIAGNOSIS — Z8261 Family history of arthritis: Secondary | ICD-10-CM

## 2018-11-20 DIAGNOSIS — Z96651 Presence of right artificial knee joint: Secondary | ICD-10-CM | POA: Diagnosis present

## 2018-11-20 DIAGNOSIS — M1712 Unilateral primary osteoarthritis, left knee: Secondary | ICD-10-CM | POA: Diagnosis present

## 2018-11-20 DIAGNOSIS — Z96652 Presence of left artificial knee joint: Secondary | ICD-10-CM

## 2018-11-20 HISTORY — PX: TOTAL KNEE ARTHROPLASTY: SHX125

## 2018-11-20 HISTORY — PX: HARDWARE REMOVAL: SHX979

## 2018-11-20 SURGERY — ARTHROPLASTY, KNEE, TOTAL
Anesthesia: General | Site: Knee | Laterality: Left

## 2018-11-20 MED ORDER — SODIUM CHLORIDE 0.9 % IV SOLN
INTRAVENOUS | Status: DC | PRN
  Administered 2018-11-20: 25 ug/min via INTRAVENOUS

## 2018-11-20 MED ORDER — BUPIVACAINE HCL (PF) 0.5 % IJ SOLN
INTRAMUSCULAR | Status: AC
  Filled 2018-11-20: qty 10

## 2018-11-20 MED ORDER — BUPIVACAINE LIPOSOME 1.3 % IJ SUSP
INTRAMUSCULAR | Status: AC
  Filled 2018-11-20: qty 20

## 2018-11-20 MED ORDER — MIDAZOLAM HCL 2 MG/2ML IJ SOLN
INTRAMUSCULAR | Status: AC
  Filled 2018-11-20: qty 2

## 2018-11-20 MED ORDER — ALBUTEROL SULFATE HFA 108 (90 BASE) MCG/ACT IN AERS: 2.0000 | INHALATION_SPRAY | Freq: Four times a day (QID) | RESPIRATORY_TRACT | Status: DC | PRN

## 2018-11-20 MED ORDER — PNEUMOCOCCAL VAC POLYVALENT 25 MCG/0.5ML IJ INJ
0.5000 mL | INJECTION | INTRAMUSCULAR | Status: AC
  Administered 2018-11-21: 0.5 mL via INTRAMUSCULAR
  Filled 2018-11-20: qty 0.5

## 2018-11-20 MED ORDER — SODIUM CHLORIDE 0.9 % IV SOLN
INTRAVENOUS | Status: DC
  Administered 2018-11-20: 16:00:00 via INTRAVENOUS

## 2018-11-20 MED ORDER — BUPIVACAINE-EPINEPHRINE (PF) 0.25% -1:200000 IJ SOLN
INTRAMUSCULAR | Status: AC
  Filled 2018-11-20: qty 60

## 2018-11-20 MED ORDER — BISACODYL 5 MG PO TBEC
10.0000 mg | DELAYED_RELEASE_TABLET | Freq: Every day | ORAL | Status: DC | PRN
  Administered 2018-11-22: 10 mg via ORAL
  Filled 2018-11-20: qty 2

## 2018-11-20 MED ORDER — SENNOSIDES-DOCUSATE SODIUM 8.6-50 MG PO TABS
1.0000 | ORAL_TABLET | Freq: Every evening | ORAL | Status: DC | PRN
  Administered 2018-11-21: 20:00:00 1 via ORAL
  Filled 2018-11-20: qty 1

## 2018-11-20 MED ORDER — LACTATED RINGERS IV SOLN
INTRAVENOUS | Status: DC
  Administered 2018-11-20: 20 mL/h via INTRAVENOUS
  Administered 2018-11-20: 09:00:00 via INTRAVENOUS

## 2018-11-20 MED ORDER — FAMOTIDINE 20 MG PO TABS
ORAL_TABLET | ORAL | Status: AC
  Filled 2018-11-20: qty 1

## 2018-11-20 MED ORDER — FENTANYL CITRATE (PF) 100 MCG/2ML IJ SOLN
INTRAMUSCULAR | Status: DC | PRN
  Administered 2018-11-20 (×2): 50 ug via INTRAVENOUS

## 2018-11-20 MED ORDER — FENTANYL CITRATE (PF) 100 MCG/2ML IJ SOLN: 25.0000 ug | INTRAMUSCULAR | Status: DC | PRN

## 2018-11-20 MED ORDER — BUPIVACAINE-EPINEPHRINE 0.25% -1:200000 IJ SOLN
INTRAMUSCULAR | Status: DC | PRN
  Administered 2018-11-20: 60 mL

## 2018-11-20 MED ORDER — MIDAZOLAM HCL 5 MG/5ML IJ SOLN
INTRAMUSCULAR | Status: DC | PRN
  Administered 2018-11-20: 2 mg via INTRAVENOUS

## 2018-11-20 MED ORDER — FENTANYL CITRATE (PF) 100 MCG/2ML IJ SOLN
INTRAMUSCULAR | Status: AC
  Filled 2018-11-20: qty 2

## 2018-11-20 MED ORDER — ATORVASTATIN CALCIUM 20 MG PO TABS
20.0000 mg | ORAL_TABLET | Freq: Every day | ORAL | Status: DC
  Administered 2018-11-20 – 2018-11-21 (×2): 20 mg via ORAL
  Filled 2018-11-20 (×2): qty 1

## 2018-11-20 MED ORDER — ONDANSETRON HCL 4 MG PO TABS: 4.0000 mg | ORAL_TABLET | Freq: Four times a day (QID) | ORAL | Status: DC | PRN

## 2018-11-20 MED ORDER — SODIUM CHLORIDE 0.9 % IV SOLN
INTRAVENOUS | Status: DC | PRN
  Administered 2018-11-20: 60 mL

## 2018-11-20 MED ORDER — MORPHINE SULFATE (PF) 4 MG/ML IV SOLN
INTRAVENOUS | Status: AC
  Filled 2018-11-20: qty 1

## 2018-11-20 MED ORDER — NEOMYCIN-POLYMYXIN B GU 40-200000 IR SOLN
Status: AC
  Filled 2018-11-20: qty 20

## 2018-11-20 MED ORDER — LORATADINE 10 MG PO TABS: 10.0000 mg | ORAL_TABLET | Freq: Every day | ORAL | Status: DC | PRN

## 2018-11-20 MED ORDER — OXYCODONE HCL 5 MG PO TABS: 10.0000 mg | ORAL_TABLET | ORAL | Status: DC | PRN

## 2018-11-20 MED ORDER — PROPOFOL 500 MG/50ML IV EMUL
INTRAVENOUS | Status: AC
  Filled 2018-11-20: qty 50

## 2018-11-20 MED ORDER — CEFAZOLIN SODIUM-DEXTROSE 1-4 GM/50ML-% IV SOLN
1.0000 g | Freq: Four times a day (QID) | INTRAVENOUS | Status: AC
  Administered 2018-11-20 (×2): 1 g via INTRAVENOUS
  Filled 2018-11-20 (×2): qty 50

## 2018-11-20 MED ORDER — VENLAFAXINE HCL ER 75 MG PO CP24
75.0000 mg | ORAL_CAPSULE | Freq: Every day | ORAL | Status: DC
  Filled 2018-11-20 (×2): qty 1

## 2018-11-20 MED ORDER — OXYCODONE HCL 5 MG PO TABS
5.0000 mg | ORAL_TABLET | ORAL | Status: DC | PRN
  Administered 2018-11-20 – 2018-11-21 (×3): 5 mg via ORAL
  Administered 2018-11-21: 05:00:00 10 mg via ORAL
  Administered 2018-11-21 – 2018-11-22 (×4): 5 mg via ORAL
  Filled 2018-11-20 (×7): qty 1
  Filled 2018-11-20: qty 2

## 2018-11-20 MED ORDER — PROPOFOL 500 MG/50ML IV EMUL
INTRAVENOUS | Status: DC | PRN
  Administered 2018-11-20: 50 ug/kg/min via INTRAVENOUS

## 2018-11-20 MED ORDER — TRANEXAMIC ACID 1000 MG/10ML IV SOLN
INTRAVENOUS | Status: AC
  Filled 2018-11-20: qty 10

## 2018-11-20 MED ORDER — TRAMADOL HCL 50 MG PO TABS
50.0000 mg | ORAL_TABLET | Freq: Four times a day (QID) | ORAL | Status: DC
  Administered 2018-11-20 – 2018-11-22 (×8): 50 mg via ORAL
  Filled 2018-11-20 (×8): qty 1

## 2018-11-20 MED ORDER — FAMOTIDINE 20 MG PO TABS
20.0000 mg | ORAL_TABLET | Freq: Once | ORAL | Status: AC
  Administered 2018-11-20: 20 mg via ORAL

## 2018-11-20 MED ORDER — NEOMYCIN-POLYMYXIN B GU 40-200000 IR SOLN
Status: DC | PRN
  Administered 2018-11-20: 14 mL

## 2018-11-20 MED ORDER — CHLORHEXIDINE GLUCONATE CLOTH 2 % EX PADS: 6.0000 | MEDICATED_PAD | Freq: Once | CUTANEOUS | Status: DC

## 2018-11-20 MED ORDER — CLINDAMYCIN PHOSPHATE 900 MG/50ML IV SOLN
INTRAVENOUS | Status: AC
  Filled 2018-11-20: qty 50

## 2018-11-20 MED ORDER — CLINDAMYCIN PHOSPHATE 600 MG/50ML IV SOLN
INTRAVENOUS | Status: AC
  Filled 2018-11-20: qty 50

## 2018-11-20 MED ORDER — SERTRALINE HCL 50 MG PO TABS
150.0000 mg | ORAL_TABLET | Freq: Every day | ORAL | Status: DC
  Administered 2018-11-21 – 2018-11-22 (×2): 150 mg via ORAL
  Filled 2018-11-20 (×2): qty 3

## 2018-11-20 MED ORDER — ACETAMINOPHEN 500 MG PO TABS
1000.0000 mg | ORAL_TABLET | Freq: Four times a day (QID) | ORAL | Status: AC
  Administered 2018-11-20 – 2018-11-21 (×4): 1000 mg via ORAL
  Filled 2018-11-20 (×4): qty 2

## 2018-11-20 MED ORDER — ENOXAPARIN SODIUM 30 MG/0.3ML ~~LOC~~ SOLN
30.0000 mg | Freq: Two times a day (BID) | SUBCUTANEOUS | Status: DC
  Administered 2018-11-21 – 2018-11-22 (×3): 30 mg via SUBCUTANEOUS
  Filled 2018-11-20 (×3): qty 0.3

## 2018-11-20 MED ORDER — HYDROMORPHONE HCL 1 MG/ML IJ SOLN: 0.5000 mg | INTRAMUSCULAR | Status: DC | PRN

## 2018-11-20 MED ORDER — GLYCOPYRROLATE 0.2 MG/ML IJ SOLN
INTRAMUSCULAR | Status: DC | PRN
  Administered 2018-11-20: 0.2 mg via INTRAVENOUS

## 2018-11-20 MED ORDER — PROPOFOL 10 MG/ML IV BOLUS
INTRAVENOUS | Status: AC
  Filled 2018-11-20: qty 20

## 2018-11-20 MED ORDER — ONDANSETRON HCL 4 MG/2ML IJ SOLN: 4.0000 mg | Freq: Once | INTRAMUSCULAR | Status: DC | PRN

## 2018-11-20 MED ORDER — ALBUTEROL SULFATE (2.5 MG/3ML) 0.083% IN NEBU: 2.5000 mg | INHALATION_SOLUTION | Freq: Four times a day (QID) | RESPIRATORY_TRACT | Status: DC | PRN

## 2018-11-20 MED ORDER — INFLUENZA VAC SPLIT QUAD 0.5 ML IM SUSY
0.5000 mL | PREFILLED_SYRINGE | INTRAMUSCULAR | Status: AC
  Administered 2018-11-21: 0.5 mL via INTRAMUSCULAR
  Filled 2018-11-20: qty 0.5

## 2018-11-20 MED ORDER — MAGNESIUM CITRATE PO SOLN: 1.0000 | Freq: Once | ORAL | Status: DC | PRN

## 2018-11-20 MED ORDER — SODIUM CHLORIDE FLUSH 0.9 % IV SOLN
INTRAVENOUS | Status: AC
  Filled 2018-11-20: qty 40

## 2018-11-20 MED ORDER — ONDANSETRON HCL 4 MG/2ML IJ SOLN: 4.0000 mg | Freq: Four times a day (QID) | INTRAMUSCULAR | Status: DC | PRN

## 2018-11-20 MED ORDER — METHOCARBAMOL 1000 MG/10ML IJ SOLN
500.0000 mg | Freq: Four times a day (QID) | INTRAVENOUS | Status: DC | PRN
  Filled 2018-11-20: qty 5

## 2018-11-20 MED ORDER — METHOCARBAMOL 500 MG PO TABS
500.0000 mg | ORAL_TABLET | Freq: Four times a day (QID) | ORAL | Status: DC | PRN
  Administered 2018-11-21 (×2): 500 mg via ORAL
  Filled 2018-11-20 (×2): qty 1

## 2018-11-20 MED ORDER — ACETAMINOPHEN 325 MG PO TABS: 325.0000 mg | ORAL_TABLET | Freq: Four times a day (QID) | ORAL | Status: DC | PRN

## 2018-11-20 MED ORDER — LEVOTHYROXINE SODIUM 75 MCG PO TABS
75.0000 ug | ORAL_TABLET | Freq: Every day | ORAL | Status: DC
  Administered 2018-11-21 – 2018-11-22 (×2): 75 ug via ORAL
  Filled 2018-11-20 (×2): qty 3
  Filled 2018-11-20 (×2): qty 1

## 2018-11-20 MED ORDER — ENOXAPARIN SODIUM 40 MG/0.4ML ~~LOC~~ SOLN: 40.0000 mg | SUBCUTANEOUS | Status: DC

## 2018-11-20 MED ORDER — DOCUSATE SODIUM 100 MG PO CAPS
100.0000 mg | ORAL_CAPSULE | Freq: Two times a day (BID) | ORAL | Status: DC
  Administered 2018-11-21: 100 mg via ORAL
  Filled 2018-11-20 (×3): qty 1

## 2018-11-20 MED ORDER — CEFAZOLIN SODIUM-DEXTROSE 2-4 GM/100ML-% IV SOLN
INTRAVENOUS | Status: AC
  Filled 2018-11-20: qty 100

## 2018-11-20 MED ORDER — AMLODIPINE BESYLATE 5 MG PO TABS
5.0000 mg | ORAL_TABLET | Freq: Every day | ORAL | Status: DC
  Administered 2018-11-21 – 2018-11-22 (×2): 5 mg via ORAL
  Filled 2018-11-20 (×2): qty 1

## 2018-11-20 MED ORDER — DIPHENHYDRAMINE HCL 12.5 MG/5ML PO ELIX: 12.5000 mg | ORAL_SOLUTION | ORAL | Status: DC | PRN

## 2018-11-20 SURGICAL SUPPLY — 73 items
BLADE SAW 90X13X1.19 OSCILLAT (BLADE) ×3 IMPLANT
BLADE SAW 90X25X1.19 OSCILLAT (BLADE) ×3 IMPLANT
CANISTER SUCT 3000ML PPV (MISCELLANEOUS) ×3 IMPLANT
CEMENT HV SMART SET (Cement) ×6 IMPLANT
CEMENT TIBIA MBT (Knees) ×1 IMPLANT
CNTNR SPEC 2.5X3XGRAD LEK (MISCELLANEOUS) ×1
CONT SPEC 4OZ STER OR WHT (MISCELLANEOUS) ×2
CONTAINER SPEC 2.5X3XGRAD LEK (MISCELLANEOUS) ×1 IMPLANT
COOLER POLAR GLACIER W/PUMP (MISCELLANEOUS) ×3 IMPLANT
COVER WAND RF STERILE (DRAPES) ×3 IMPLANT
CUFF TOURN SGL QUICK 24 (TOURNIQUET CUFF)
CUFF TOURN SGL QUICK 30 (TOURNIQUET CUFF)
CUFF TOURN SGL QUICK 34 (TOURNIQUET CUFF) ×2
CUFF TRNQT CYL 24X4X16.5-23 (TOURNIQUET CUFF) IMPLANT
CUFF TRNQT CYL 30X4X21-28X (TOURNIQUET CUFF) IMPLANT
CUFF TRNQT CYL 34X4.125X (TOURNIQUET CUFF) ×1 IMPLANT
DRAPE 3/4 80X56 (DRAPES) ×6 IMPLANT
DRAPE IMP U-DRAPE 54X76 (DRAPES) ×6 IMPLANT
DRAPE INCISE IOBAN 66X60 STRL (DRAPES) ×3 IMPLANT
DRAPE SURG 17X11 SM STRL (DRAPES) ×6 IMPLANT
DRSG OPSITE POSTOP 4X12 (GAUZE/BANDAGES/DRESSINGS) ×3 IMPLANT
DRSG OPSITE POSTOP 4X14 (GAUZE/BANDAGES/DRESSINGS) IMPLANT
DURAPREP 26ML APPLICATOR (WOUND CARE) ×9 IMPLANT
ELECT REM PT RETURN 9FT ADLT (ELECTROSURGICAL) ×3
ELECTRODE REM PT RTRN 9FT ADLT (ELECTROSURGICAL) ×1 IMPLANT
GAUZE SPONGE 4X4 12PLY STRL (GAUZE/BANDAGES/DRESSINGS) ×3 IMPLANT
GLOVE BIO SURGEON STRL SZ7.5 (GLOVE) ×3 IMPLANT
GLOVE BIOGEL PI IND STRL 7.5 (GLOVE) ×7 IMPLANT
GLOVE BIOGEL PI IND STRL 9 (GLOVE) ×1 IMPLANT
GLOVE BIOGEL PI INDICATOR 7.5 (GLOVE) ×14
GLOVE BIOGEL PI INDICATOR 9 (GLOVE) ×2
GLOVE INDICATOR 7.5 STRL GRN (GLOVE) ×3 IMPLANT
GLOVE SURG 9.0 ORTHO LTXF (GLOVE) ×6 IMPLANT
GLOVE SURG SYN 6.5 ES PF (GLOVE) ×3 IMPLANT
GOWN STRL REUS TWL 2XL XL LVL4 (GOWN DISPOSABLE) ×3 IMPLANT
GOWN STRL REUS W/ TWL LRG LVL3 (GOWN DISPOSABLE) ×3 IMPLANT
GOWN STRL REUS W/ TWL LRG LVL4 (GOWN DISPOSABLE) ×1 IMPLANT
GOWN STRL REUS W/TWL LRG LVL3 (GOWN DISPOSABLE) ×6
GOWN STRL REUS W/TWL LRG LVL4 (GOWN DISPOSABLE) ×2
HOLDER FOLEY CATH W/STRAP (MISCELLANEOUS) ×3 IMPLANT
IMMBOLIZER KNEE 19 BLUE UNIV (SOFTGOODS) ×3 IMPLANT
IMPL FEMUR SIGMA LT PS SZ 3 (Knees) ×1 IMPLANT
IMPLANT FEMUR SIGMA LT PS SZ 3 (Knees) ×3 IMPLANT
INSERT PFC SIG STB SZ3 15.0MM (Knees) ×3 IMPLANT
KIT TURNOVER KIT A (KITS) ×3 IMPLANT
MANIFOLD NEPTUNE II (INSTRUMENTS) ×3 IMPLANT
NDL SAFETY ECLIPSE 18X1.5 (NEEDLE) ×1 IMPLANT
NEEDLE HYPO 18GX1.5 SHARP (NEEDLE) ×2
NEEDLE HYPO 22GX1.5 SAFETY (NEEDLE) ×3 IMPLANT
NEEDLE SPNL 20GX3.5 QUINCKE YW (NEEDLE) ×3 IMPLANT
NS IRRIG 1000ML POUR BTL (IV SOLUTION) ×3 IMPLANT
PACK TOTAL KNEE (MISCELLANEOUS) ×3 IMPLANT
PAD WRAPON POLAR KNEE (MISCELLANEOUS) ×1 IMPLANT
PATELLA DOME PFC 38MM (Knees) ×3 IMPLANT
PENCIL SMOKE ULTRAEVAC 22 CON (MISCELLANEOUS) ×3 IMPLANT
PULSAVAC PLUS IRRIG FAN TIP (DISPOSABLE) ×3
SOL .9 NS 3000ML IRR  AL (IV SOLUTION) ×2
SOL .9 NS 3000ML IRR UROMATIC (IV SOLUTION) ×1 IMPLANT
SPONGE LAP 18X18 RF (DISPOSABLE) IMPLANT
STAPLER SKIN PROX 35W (STAPLE) ×3 IMPLANT
SUCTION FRAZIER HANDLE 10FR (MISCELLANEOUS) ×2
SUCTION TUBE FRAZIER 10FR DISP (MISCELLANEOUS) ×1 IMPLANT
SUT ETHIBOND NAB CT1 #1 30IN (SUTURE) ×9 IMPLANT
SUT VIC AB 0 CT1 36 (SUTURE) ×3 IMPLANT
SUT VIC AB 2-0 CT1 (SUTURE) ×9 IMPLANT
SYR 20ML LL LF (SYRINGE) ×3 IMPLANT
SYR 30ML LL (SYRINGE) ×6 IMPLANT
TIBIA MBT CEMENT (Knees) ×3 IMPLANT
TIP FAN IRRIG PULSAVAC PLUS (DISPOSABLE) ×1 IMPLANT
TOWER CARTRIDGE SMART MIX (DISPOSABLE) ×3 IMPLANT
TRAY FOLEY MTR SLVR 16FR STAT (SET/KITS/TRAYS/PACK) ×3 IMPLANT
TUBE SUCT KAM VAC (TUBING) ×3 IMPLANT
WRAPON POLAR PAD KNEE (MISCELLANEOUS) ×3

## 2018-11-20 NOTE — Anesthesia Preprocedure Evaluation (Signed)
Anesthesia Evaluation  Patient identified by MRN, date of birth, ID band Patient awake    Reviewed: Allergy & Precautions, NPO status , Patient's Chart, lab work & pertinent test results  History of Anesthesia Complications (+) history of anesthetic complications (difficulty with spinal, aspirartion with nasal surgery)  Airway Mallampati: III       Dental   Pulmonary asthma , neg sleep apnea, Not current smoker,           Cardiovascular hypertension, Pt. on medications (-) Past MI and (-) CHF (-) dysrhythmias (-) Valvular Problems/Murmurs     Neuro/Psych neg Seizures Anxiety Depression    GI/Hepatic Neg liver ROS, neg GERD  ,  Endo/Other  neg diabetesHypothyroidism   Renal/GU negative Renal ROS     Musculoskeletal   Abdominal   Peds  Hematology   Anesthesia Other Findings   Reproductive/Obstetrics                             Anesthesia Physical Anesthesia Plan  ASA: III  Anesthesia Plan: General   Post-op Pain Management:    Induction: Intravenous  PONV Risk Score and Plan: 3 and Propofol infusion, TIVA and Ondansetron  Airway Management Planned: Nasal Cannula and Oral ETT  Additional Equipment:   Intra-op Plan:   Post-operative Plan:   Informed Consent: I have reviewed the patients History and Physical, chart, labs and discussed the procedure including the risks, benefits and alternatives for the proposed anesthesia with the patient or authorized representative who has indicated his/her understanding and acceptance.       Plan Discussed with:   Anesthesia Plan Comments: (Discussed the options of spinal vs GOT. Pt states that we can attempt the spinal, but she has had 2 times with failed attempts.)        Anesthesia Quick Evaluation

## 2018-11-20 NOTE — Progress Notes (Signed)
  Subjective:  POST-OP CHECK s/p LEFT TKA.   Patient reports left knee pain as mild.  Patient is currently comfortable lying in bed.  She has tolerated p.o. diet.  Patient denies any nausea vomiting, shortness of breath or chest pain.  Objective:   VITALS:   Vitals:   11/20/18 1200 11/20/18 1209 11/20/18 1224 11/20/18 1327  BP: 118/63 109/62 127/72 129/70  Pulse: 65 62 63 (!) 57  Resp: 17 14 18 17   Temp:   98.1 F (36.7 C) 98.4 F (36.9 C)  TempSrc:   Oral Oral  SpO2: 100% 100% 99% 100%  Weight:      Height:        PHYSICAL EXAM: Left lower extremity: Neurovascular intact Sensation intact distally Intact pulses distally Dorsiflexion/Plantar flexion intact Incision: dressing C/D/I Compartment soft  LABS  No results found for this or any previous visit (from the past 24 hour(s)).  Dg Knee Left Port  Result Date: 11/20/2018 CLINICAL DATA:  Status post total left knee arthroplasty. EXAM: PORTABLE LEFT KNEE - 1-2 VIEW COMPARISON:  None. FINDINGS: The femoral and tibial components are well seated. No complicating features are identified. IMPRESSION: Well seated components of a total knee arthroplasty. Electronically Signed   By: Marijo Sanes M.D.   On: 11/20/2018 12:05    Assessment/Plan: Day of Surgery   Active Problems:   S/P TKR (total knee replacement) using cement, left  Patient doing well postop.  Her postoperative x-ray demonstrates the total knee arthroplasty components are well-positioned.  There is no evidence of fracture dislocation or other postop complication.  Will receive 24 hours of postop antibiotics.  Her Foley catheter will be removed tomorrow.  Labs will be drawn in the morning.  Patient will begin physical therapy this afternoon and continue tomorrow.  Patient is 50% partial weightbearing on the left lower extremity for now.  Patient will begin Lovenox for DVT prophylaxis tomorrow.    Thornton Park , MD 11/20/2018, 1:59 PM

## 2018-11-20 NOTE — Transfer of Care (Signed)
Immediate Anesthesia Transfer of Care Note  Patient: Jessica Brennan  Procedure(s) Performed: TOTAL KNEE ARTHROPLASTY (Left Knee) HARDWARE REMOVAL (Left Knee)  Patient Location: PACU  Anesthesia Type:Spinal  Level of Consciousness: awake, alert  and oriented  Airway & Oxygen Therapy: Patient Spontanous Breathing and Patient connected to face mask oxygen  Post-op Assessment: Report given to RN and Post -op Vital signs reviewed and stable  Post vital signs: Reviewed and stable  Last Vitals:  Vitals Value Taken Time  BP 107/67 11/20/18 1109  Temp    Pulse 78 11/20/18 1109  Resp 15 11/20/18 1109  SpO2 100 % 11/20/18 1109  Vitals shown include unvalidated device data.  Last Pain:  Vitals:   11/20/18 0624  TempSrc: Tympanic  PainSc: 6       Patients Stated Pain Goal: 1 (53/97/67 [id number])  Complications: No apparent anesthesia complications

## 2018-11-20 NOTE — Anesthesia Procedure Notes (Signed)
Performed by: Kevyn Boquet, CRNA Pre-anesthesia Checklist: Patient identified, Emergency Drugs available, Suction available, Patient being monitored and Timeout performed Patient Re-evaluated:Patient Re-evaluated prior to induction Oxygen Delivery Method: Simple face mask Induction Type: IV induction       

## 2018-11-20 NOTE — Anesthesia Post-op Follow-up Note (Signed)
Anesthesia QCDR form completed.        

## 2018-11-20 NOTE — H&P (Signed)
PREOPERATIVE H&P  Chief Complaint: M17.12 unilateral primary osteoarthritis, left knee  HPI: Jessica Brennan is a 53 y.o. female who presents for preoperative history and physical with a diagnosis of M17.12 unilateral primary osteoarthritis, left knee. Symptoms of pain, swelling and limited ROM are significantly impairing activities of daily living.  Patient has severe tricompartmental osteoarthritis by x-ray including bone-on-bone contact in the medial compartment, subchondral cyst formation, subchondral sclerosis and osteophytes.  Patient has successfully undergone a right total knee arthroplasty by me on 09/18/2018. She wishes to proceed with a left total knee arthroplasty at this time as she has done very well following her right total knee..   Past Medical History:  Diagnosis Date  . Anxiety   . Asthma   . Diffuse cystic mastopathy   . Dysthymic disorder   . Hypertension   . Hypothyroidism   . Menopausal state   . Obesity   . Vitamin D deficiency    Past Surgical History:  Procedure Laterality Date  . ABDOMINAL HYSTERECTOMY    . ANTERIOR CRUCIATE LIGAMENT REPAIR Left 2002   needs to have screw removed during tkr  . BREAST BIOPSY Left    1979 negative  . CHOLECYSTECTOMY    . DG LEFT TIBIA AND FIBULA ([facility] HX) Left   . SEPTOPLASTY  2010  . TOTAL KNEE ARTHROPLASTY Right 09/18/2018   Procedure: TOTAL KNEE ARTHROPLASTY;  Surgeon: Juanell FairlyKrasinski, Shareen Capwell, MD;  Location: [facility] ORS;  Service: Orthopedics;  Laterality: Right;   Social History   Socioeconomic History  . Marital status: Divorced    Spouse name: Not on file  . Number of children: Not on file  . Years of education: Not on file  . Highest education level: Not on file  Occupational History  . 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 Financial resource strain: Not on file  . Food insecurity    Worry: Not on file    Inability: Not on file  . Transportation needs    Medical: Not on file   Non-medical: Not on file  Tobacco Use  . Smoking status: Never Smoker  . Smokeless tobacco: Never Used  Substance and Sexual Activity  . Alcohol use: Yes    Alcohol/week: 6.0 standard drinks    Types: 6 Cans of beer per week    Comment: on occasion  . Drug use: No  . Sexual activity: Never  Lifestyle  . Physical activity    Days per week: Not on file    Minutes per session: Not on file  . Stress: Not on file  Relationships  . Social Musicianconnections    Talks on phone: Not on file    Gets together: Not on file    Attends religious service: Not on file    Active member of club or organization: Not on file    Attends meetings of clubs or organizations: Not on file    Relationship status: Not on file  Other Topics Concern  . Not on file  Social History Narrative  . Not on file   Family History  Problem Relation Age of Onset  . Cancer Mother        lung  . Heart disease Father   . Hyperlipidemia Sister   . Arthritis Sister   . Hyperlipidemia Brother   . Arthritis Brother   . Cancer Paternal Grandmother        uterine  . Stroke Paternal Grandfather   . Arthritis Brother   . Hyperlipidemia  Brother   . Arthritis Brother   . Arthritis Sister    Allergies  Allergen Reactions  . Codeine Sulfate Hives and Other (See Comments)    Stomachache, GI upset, welts  . Other Other (See Comments)    Mold- Sinus infections  . Pollen Extract Other (See Comments)    sneezing   Prior to Admission medications   Medication Sig Start Date End Date Taking? Authorizing Provider  acetaminophen (TYLENOL) 325 MG tablet Take 650 mg by mouth every 6 (six) hours as needed.   Yes [provider]  albuterol (PROVENTIL HFA;VENTOLIN HFA) 108 (90 Base) MCG/ACT inhaler Inhale 2 puffs into the lungs every 6 (six) hours as needed for wheezing or shortness of breath. 05/14/18  Yes Cannady, Jolene T, NP  amLODipine (NORVASC) 5 MG tablet TAKE 1 TABLET BY MOUTH EVERY DAY 09/15/18  Yes Cannady, Jolene T,  NP  atorvastatin (LIPITOR) 20 MG tablet Take 1 tablet (20 mg total) by mouth daily. Patient taking differently: Take 20 mg by mouth daily at 6 PM.  05/15/18  Yes Cannady, Jolene T, NP  levothyroxine (SYNTHROID) 75 MCG tablet TAKE 1 TABLET (75 MCG TOTAL) BY MOUTH DAILY BEFORE BREAKFAST. 11/08/18  Yes Cannady, Jolene T, NP  oxyCODONE (OXY IR/ROXICODONE) 5 MG immediate release tablet Take 1-2 tablets (5-10 mg total) by mouth every 4 (four) hours as needed for moderate pain (pain score 4-6). 09/20/18  Yes Altamese Cabal, PA-C  sertraline (ZOLOFT) 100 MG tablet Take 150 mg by mouth daily.  07/16/18  Yes [provider]  docusate sodium (COLACE) 100 MG capsule Take 1 capsule (100 mg total) by mouth 2 (two) times daily. Patient not taking: Reported on 11/13/2018 09/20/18   Altamese Cabal, PA-C  enoxaparin (LOVENOX) 40 MG/0.4ML injection Inject 0.4 mLs (40 mg total) into the skin daily. 09/20/18 10/20/18  Altamese Cabal, PA-C  loratadine (CLARITIN) 10 MG tablet Take 10 mg by mouth daily as needed for allergies.    [provider]  methocarbamol (ROBAXIN) 500 MG tablet Take 1 tablet (500 mg total) by mouth every 6 (six) hours as needed for muscle spasms. Patient not taking: Reported on 11/13/2018 09/20/18   Altamese Cabal, PA-C  venlafaxine XR (EFFEXOR-XR) 75 MG 24 hr capsule Take 3 capsules (225 mg total) by mouth daily with breakfast. DX: F32.2 Patient taking differently: Take 75 mg by mouth daily with breakfast. DX: F32.2 11/13/17 09/15/18  Olevia Perches P, DO     Positive ROS: All other systems have been reviewed and were otherwise negative with the exception of those mentioned in the HPI and as above.  Physical Exam: General: Alert, no acute distress Cardiovascular: Regular rate and rhythm, no murmurs rubs or gallops.  No pedal edema Respiratory: Clear to auscultation bilaterally, no wheezes rales or rhonchi. No cyanosis, no use of accessory musculature GI: No organomegaly, abdomen is soft  and non-tender nondistended with positive bowel sounds. Skin: Skin intact, no lesions within the operative field. Neurologic: Sensation intact distally Psychiatric: Patient is competent for consent with normal mood and affect Lymphatic: No cervical lymphadenopathy  MUSCULOSKELETAL: Left lower extremity: Patient skin is intact.  There is no erythema ecchymosis and only a trace effusion.  Her range of motion is from full extension to approximately 110 degrees of flexion.  Patient has no ligamentous laxity.  She has patellofemoral crepitus and grind but no apprehension.  Patient has 5 out of 5 strength in all muscles, intact sensation light touch and palpable pedal pulses.  Assessment: M17.12  unilateral primary osteoarthritis, left knee  Plan: Plan for Procedure(s): LEFT TOTAL KNEE ARTHROPLASTY  Patient has been through a right total knee arthroplasty.  She is familiar with the details of the operation as well as the postoperative course.  She also understands the risks of total knee arthroplasty surgery include but are not limited to infection requiring the removal of the prosthesis, bleeding requiring blood transfusion, nerve or blood vessel injury, joint stiffness or loss of motion, persistent pain, weakness or instability, loosening or hardware failure and the need for further surgery. Medical risks include but are not limited to DVT and pulmonary embolism, myocardial infarction, stroke, pneumonia, respiratory failure and death. Patient understood these risks and wished to proceed.     Thornton Park, MD   11/20/2018 7:49 AM

## 2018-11-20 NOTE — Op Note (Signed)
DATE OF SURGERY:  11/20/2018 TIME: 11:32 AM  PATIENT NAME:  Jessica Brennan   AGE: 53 y.o.    PRE-OPERATIVE DIAGNOSIS:  M17.12 unilateral primary osteoarthritis, left knee  POST-OPERATIVE DIAGNOSIS:  Same  PROCEDURE:  Procedure(s): LEFT TOTAL KNEE ARTHROPLASTY HARDWARE REMOVAL  SURGEON:  Thornton Park, MD   ASSISTANT:  Tessa Lerner, PA  OPERATIVE IMPLANTS: Depuy PFC Sigma, Posterior Stabilized Femural component size 3, Tibia size rotating platform component size 3, Patella polyethylene 3-peg oval button size 38, with a 15 mm polyethylene insert.  EBL:  minimal  TOURNIQUET TIME:  130 minutes  PREOPERATIVE INDICATIONS:  Jessica Brennan is an 53 y.o. female who has a diagnosis of  M17.12 unilateral primary osteoarthritis, left knee and elected for a left total knee arthroplasty after failing nonoperative treatment.  Their knee pain significantly impacts their activity of daily living.  Radiographs have demonstrated tricompartmental osteoarthritis joint space narrowing, osteophytes, subchondral sclerosis and cyst formation.  The risks, benefits, and alternatives were discussed at length including but not limited to the risks of infection, bleeding, nerve or blood vessel injury, knee stiffness, fracture, dislocation, loosening or failure of the hardware and the need for further surgery. Medical risks include but not limited to DVT and pulmonary embolism, myocardial infarction, stroke, pneumonia, respiratory failure and death. I discussed these risks with the patient in my office prior to the date of surgery. They understood these risks and were willing to proceed.  OPERATIVE FINDINGS AND UNIQUE ASPECTS OF THE CASE:  Severe tricompartmental osteoarthritis with huge osteophytes.    OPERATIVE DESCRIPTION:  The patient was brought to the operative room and placed in a supine position after undergoing placement of a spinal anesthetic.  A Foley catheter was placed.  IV antibiotics were  given. Patient received ancef 2 grams and 600 mg IV clindamycin.  Patient was also given tranexamic acid prior to the tourniquet being inflated.  The left lower extremity was prepped and draped in the usual sterile fashion.  A time out was performed to verify the patient's name, date of birth, medical record number, correct site of surgery and correct procedure to be performed. The timeout was also used to confirm the patient received antibiotics and that appropriate instruments, implants and radiographs studies were available in the room.  The leg was elevated and exsanguinated with an Esmarch and the tourniquet was inflated to 275 mmHg for 130 minutes.  A midline incision was made over the left knee incorporating her prior ACL incision. Full-thickness skin flaps were developed. A medial parapatellar arthrotomy was then made and the patella everted and the knee was brought into 90 of flexion. Hoffa's fat pad along with the cruciate ligaments and medial and lateral menisci were resected.   The distal femoral intramedullary canal was opened with a drill and the intramedullary distal femoral cutting jig was inserted into the femoral canal pinned into position. It was set at 5 degrees resecting 10 mm off the distal femur.  Care was taken to protect the collateral ligaments during distal femoral resection.  The distal femoral resection was performed with an oscillating saw. The femoral cutting guide was then removed.  The extramedullary tibial cutting guide was then placed using the anterior tibial crest and second ray of the foot as a references.  The tibial cutting guide was adjusted to allow for appropriate posterior slope.  The tibial cutting block was pinned into position. The slotted stylus was used to measure the proximal tibial resection of 10 mm off the high  lateral side.  The tibial long rod alignment guide was then used to confirm position of the cutting block. A third cross pin through the tibial  cutting block was then drilled into position to allow for rotational stability. Care was taken during the tibial resection to protect the medial and collateral ligaments.  The resected tibial bone was removed along with the posterior horns of the menisci.  The PCL was sacrificed.  Extension gap was measured with a spacer block and alignment and extension was confirmed using a long alignment rod.  The attention was then turned back to the femur. The posterior referencing distal femoral sizing guide was applied to the distal femur.  The femur was sized to be a size 3. Rotation of the referencing guide was checked with the epicondylar axis and Whitesides line. Then the 4-in-1 cutting jig was then applied to the distal femur. A stylus was used to confirm that the anterior femur would not be notched.   Then the anterior, posterior and chamfer femoral cuts were then made with an oscillating saw.  The flexion gap was then measured with a flexion spacer block and long alignment rod and was found to be symmetric with the extension gap and perpendicular to mechanical axis of the tibia.  The distal femoral preparation was completed by performing the posterior stabilized box cut using the cutting block.  A metallic screw from her previous ACL was encountered cutting a box and was removed.  The entry site for the intramedullary femoral guide was filled with autologous bone graft from bone previously resected earlier in the case.  The proximal tibia plateau was then sized with trial trays. The best coverage was achieved with a size 3. This tibial tray was then pinned into position. The proximal tibia was then prepared with the reamer and keel punch.  After tibial preparation was completed, all trial components were inserted with polyethylene trials.  The knee was found to have excellent balance and full motion with a size 12.5 mm tibial polyethylene insert..    The attention was then turned to preparation of the patella.  The thickness of the patella was measured with a caliper, the diameter measured with the patella templates.  The patella resection was then made with an oscillating saw using the patella cutting guide.  3 peg holes for the patella component were then drilled. The trial patella was then placed. Knee was taken through a full range of motion and deemed to be stable with the trial components. All trial components were then removed. The knee capsule was then injected with Exparel.  The knee joint capsule was injected with a mixture of quarter percent Marcaine and Toradol to assist with postoperative pain relief.  The joint was copiously irrigated with pulse lavage.  The final total knee arthroplasty components were then cemented into place with a 12.5 mm trial polyethylene insert and all excess methylmethacrylate was removed.  The joint was again copiously irrigated. After the cement had hardened the knee was again taken through a full range of motion. It was felt to be most stable with the 15 mm tibial polyethylene insert. The actual tibial polyethylene insert was then placed.   The knee was taken through a range of motion and the patella tracked well and the knee was again irrigated copiously.    The medial arthrotomy was closed with #1 Ethibond. The subcutaneous tissue closed with 0 and 2-0 vicryl, and skin approximated with staples.  A dry sterile and compressive dressing  was applied.  A Polar Care was applied to the operative knee along with a knee immobilizer.  The patient was awakened and brought to the PACU in stable and satisfactory condition.  All sharp, lap and instrument counts were correct at the conclusion the case. I spoke with the patient's sister in the postop consultation room to let her know the case had been performed without complication and the patient was stable in recovery room.

## 2018-11-20 NOTE — Consult Note (Addendum)
[place] for Enoxaparin dosing  Indication: VTE prophylaxis  Allergies  Allergen Reactions  . Codeine Sulfate Hives and Other (See Comments)    Stomachache, GI upset, welts  . Other Other (See Comments)    Mold- Sinus infections  . Pollen Extract Other (See Comments)    sneezing    Patient Measurements: Height: 5\' 3"  (160 cm) Weight: 234 lb 12.6 oz (106.5 kg) IBW/kg (Calculated) : 52.4 Heparin Dosing Weight: 77.8 kg   Vital Signs: Temp: 98.1 F (36.7 C) (09/24 1224) Temp Source: Oral (09/24 1224) BP: 127/72 (09/24 1224) Pulse Rate: 63 (09/24 1224)  Labs: No results for input(s): HGB, HCT, PLT, APTT, LABPROT, INR, HEPARINUNFRC, HEPRLOWMOCWT, CREATININE, CKTOTAL, CKMB, TROPONINIHS in the last 72 hours.  Estimated Creatinine Clearance: 92.8 mL/min (by C-G formula based on SCr of 0.8 mg/dL).   Medications:  Enoxaparin 40 mg Q24H- scheduled to start 9/25 @ 0800  Assessment: Pharmacy consulted for VTE prophylaxis s/p L TK Arthroplasty Hardware removal. Patient had the procedure today. Although patient is obese, patient should have adequate anticoagulant coverage on standard dose for TKA (please see reference below).  Sadeghi B. Mechanical and suboptimal pharmacologic prophylaxis and delayed mobilization but not morbid obesity are associated with venous thromboembolism after total knee arthroplasty: A case-control study. J. Hosp. Med. 2012 November;7(9):665-671     Plan:  1. Will adjust enoxaparin to 30 mg Q12H.   Rowland Lathe 11/20/2018,12:52 PM

## 2018-11-20 NOTE — Evaluation (Addendum)
Physical Therapy Evaluation Patient Details Name: Jessica Brennan MRN: [id number] DOB: Oct 20, 1965 Today's Date: 11/20/2018   History of Present Illness  Per MD: Pt is a 53 y.o. female who presents with unilateral primary osteoarthritis left knee and is s/p elective L TKA.   Patient has successfully undergone a right TKA on 09/18/2018 with PMH also including: Ashthma, HTN, anxiety, depression,and hypothyroidism.  Clinical Impression  Pt presented with deficits in strength, transfers, mobility, gait, balance, activity tolerance, and L knee ROM. Pt was in good spirits and glad that the post-op medication was taking effect. Pt's BLE sensation and strength had returned, and was able to complete TKA HEP exercises, including the SLR, with min cuing and no A. Pt mobilized to EOB and sat unsupported, and stood to RW with min cuing and CGA. Pt sidestepped with RW and min asst to coordinate movement and for safety. Pt was able to demonstrate fair compliance with PWB status but was impulsive at times and required redirection for safety. SpO2 dropped to 88%, but returned to 94% after less than a minute of rest, and pain did not become a factor. Pt will benefit from HHPT services upon discharge to safely address above deficits for decreased caregiver assistance and eventual return to PLOF.     Follow Up Recommendations Home health PT    Equipment Recommendations  None recommended by PT    Recommendations for Other Services       Precautions / Restrictions Precautions Precautions: Knee;Fall Precaution Comments: Knee immobilizer on when pt at rest to encourage ext, may be off with rehab Required Braces or Orthoses: Knee Immobilizer - Left Knee Immobilizer - Left: Other (comment) Restrictions Weight Bearing Restrictions: Yes LLE Weight Bearing: Partial weight bearing LLE Partial Weight Bearing Percentage or Pounds: 50%      Mobility  Bed Mobility Overal bed mobility: Modified Independent                 Transfers Overall transfer level: Needs assistance Equipment used: Rolling walker (2 wheeled) Transfers: Sit to/from Stand Sit to Stand: Min guard         General transfer comment: Pt stood with min cuing and CGA  Ambulation/Gait Ambulation/Gait assistance: Min Chemical engineerassist Gait Distance (Feet): 3 Feet Assistive device: Rolling walker (2 wheeled) Gait Pattern/deviations: Step-to pattern;Decreased step length - right;Decreased step length - left;Antalgic Gait velocity: Decreased   General Gait Details: Once standing, pt declined to move over to the chair but completed sidestepping inside the walker from the foot to the middle of the bed with min cuing and A. Pt demonstrated fair compliance for LLE PWB, but was impulsive at times.  Stairs            Wheelchair Mobility    Modified Rankin (Stroke Patients Only)       Balance Overall balance assessment: Needs assistance Sitting-balance support: No upper extremity supported Sitting balance-Leahy Scale: Good     Standing balance support: Bilateral upper extremity supported Standing balance-Leahy Scale: Fair Standing balance comment: Mod lean on RW in standing                             Pertinent Vitals/Pain Pain Assessment: 0-10 Pain Score: 6  Pain Location: Around the knee Pain Descriptors / Indicators: Aching;Tender;Sore Pain Intervention(s): Limited activity within patient's tolerance;Monitored during session;Premedicated before session    Home Living Family/patient expects to be discharged to:: Private residence Living Arrangements: Alone Available Help at Discharge: Family;Available  24 hours/day;Other (Comment)(Sister is staying with her and is available for a limited time) Type of Home: House Home Access: Stairs to enter Entrance Stairs-Rails: Can reach both Entrance Stairs-Number of Steps: 4 Home Layout: One level Home Equipment: Cane - single point;Bedside commode;Walker - 2 wheels       Prior Function Level of Independence: Independent with assistive device(s)         Comments: Ind ambulation HH distances without AD, ambulates with SPC for community distances, Ind with ADL, no falls, drives self     Hand Dominance   Dominant Hand: Right    Extremity/Trunk Assessment   Upper Extremity Assessment Upper Extremity Assessment: Overall WFL for tasks assessed    Lower Extremity Assessment Lower Extremity Assessment: Generalized weakness;LLE deficits/detail LLE Deficits / Details: Pt able to perform Ind LLE SLR without extensor lag LLE: Unable to fully assess due to pain LLE Sensation: WNL LLE Coordination: WNL    Cervical / Trunk Assessment Cervical / Trunk Assessment: Normal  Communication   Communication: No difficulties  Cognition Arousal/Alertness: Awake/alert Behavior During Therapy: WFL for tasks assessed/performed Overall Cognitive Status: Within Functional Limits for tasks assessed                                        General Comments      Exercises Total Joint Exercises Ankle Circles/Pumps: AROM;Strengthening;Both;15 reps Quad Sets: AROM;Strengthening;Both;15 reps Heel Slides: AROM;Strengthening;Left;15 reps Hip ABduction/ADduction: AROM;Strengthening;Both;10 reps;15 reps Straight Leg Raises: AROM;Strengthening;Both;5 reps;10 reps Long Arc Quad: AROM;Strengthening;Both;10 reps;15 reps Knee Flexion: AROM;Strengthening;Both;10 reps Goniometric ROM: L knee AROM grossly 21-75 degress Other Exercises Other Exercises: Pt educated and training on PWB precautions Other Exercises: Pt side-stepped using the PWB pattern with the rolling walking to move from the foot to the middle of the bed   Assessment/Plan    PT Assessment Patient needs continued PT services  PT Problem List Decreased range of motion;Decreased activity tolerance;Decreased balance;Decreased strength       PT Treatment Interventions Gait training;Stair  training;Functional mobility training;Therapeutic activities;Therapeutic exercise;Balance training;DME instruction    PT Goals (Current goals can be found in the Care Plan section)  Acute Rehab PT Goals Patient Stated Goal: Return to community ambulation distances, and later to go hiking PT Goal Formulation: With patient Time For Goal Achievement: 12/03/18 Potential to Achieve Goals: Good    Frequency BID   Barriers to discharge        Co-evaluation               AM-PAC PT "6 Clicks" Mobility  Outcome Measure Help needed turning from your back to your side while in a flat bed without using bedrails?: None Help needed moving from lying on your back to sitting on the side of a flat bed without using bedrails?: A Little Help needed moving to and from a bed to a chair (including a wheelchair)?: A Little Help needed standing up from a chair using your arms (e.g., wheelchair or bedside chair)?: A Lot Help needed to walk in hospital room?: A Lot Help needed climbing 3-5 steps with a railing? : A Lot 6 Click Score: 16    End of Session Equipment Utilized During Treatment: Gait belt Activity Tolerance: Patient limited by fatigue Patient left: in bed;with call bell/phone within reach;with bed alarm set;with SCD's reapplied;Other (comment)(L knee immobilizer and Polar Care donned)   PT Visit Diagnosis: Unsteadiness on feet (R26.81);Other abnormalities  of gait and mobility (R26.89);Muscle weakness (generalized) (M62.81)    Time: 9381-0175 PT Time Calculation (min) (ACUTE ONLY): 46 min   Charges:             Juanda Crumble "Gus" Jeannette Corpus, SPT  11/20/18, 5:29 PM   This patient treatment was supervised by this clinician.  The note, response to treatment and overall treatment plan has been reviewed and this clinician agrees with the information provided.  Linus Salmons, PT, DPT 11/20/18, 5:48 PM

## 2018-11-21 LAB — BASIC METABOLIC PANEL
Anion gap: 8 (ref 5–15)
BUN: 9 mg/dL (ref 6–20)
CO2: 24 mmol/L (ref 22–32)
Calcium: 8.5 mg/dL — ABNORMAL LOW (ref 8.9–10.3)
Chloride: 102 mmol/L (ref 98–111)
Creatinine, Ser: 0.59 mg/dL (ref 0.44–1.00)
GFR calc Af Amer: >60 mL/min (ref >60)
GFR calc non Af Amer: >60 mL/min (ref >60)
Glucose, Bld: 137 mg/dL — ABNORMAL HIGH (ref 70–99)
Potassium: 3.3 mmol/L — ABNORMAL LOW (ref 3.5–5.1)
Sodium: 134 mmol/L — ABNORMAL LOW (ref 135–145)

## 2018-11-21 LAB — CBC
HCT: 39.7 % (ref 36.0–46.0)
Hemoglobin: 12.9 g/dL (ref 12.0–15.0)
MCH: 28.5 pg (ref 26.0–34.0)
MCHC: 32.5 g/dL (ref 30.0–36.0)
MCV: 87.8 fL (ref 80.0–100.0)
Platelets: 177 10*3/uL (ref 150–400)
RBC: 4.52 MIL/uL (ref 3.87–5.11)
RDW: 14.5 % (ref 11.5–15.5)
WBC: 13.4 10*3/uL — ABNORMAL HIGH (ref 4.0–10.5)
nRBC: 0 % (ref 0.0–0.2)

## 2018-11-21 MED ORDER — VITAMIN D 25 MCG (1000 UNIT) PO TABS
5000.0000 [IU] | ORAL_TABLET | Freq: Every day | ORAL | Status: DC
  Administered 2018-11-21 – 2018-11-22 (×2): 5000 [IU] via ORAL
  Filled 2018-11-21 (×2): qty 5

## 2018-11-21 MED ORDER — POTASSIUM CHLORIDE CRYS ER 20 MEQ PO TBCR
20.0000 meq | EXTENDED_RELEASE_TABLET | Freq: Once | ORAL | Status: AC
  Administered 2018-11-21: 20 meq via ORAL
  Filled 2018-11-21: qty 1

## 2018-11-21 NOTE — Progress Notes (Signed)
Physical Therapy Treatment Patient Details Name: Jessica Brennan MRN: [id number] DOB: October 19, 1965 Today's Date: 11/21/2018    History of Present Illness Per MD: Pt is a 53 y.o. female who presents with unilateral primary osteoarthritis of the left knee and is s/p elective L TKA.   Patient has successfully undergone a right TKA on 09/18/2018 with PMH also including: Ashthma, HTN, anxiety, depression,and hypothyroidism.    PT Comments    Pt presented with deficits in strength, transfers, mobility, gait, balance, activity tolerance, and L knee ROM. Pt reported that she was in more pain and has been able to move less this morning than she did last night. Pt completed mobility and exercises seated, BP was taken at EOB and was 166-76, and was asymptomatic throughout. Pt ambulated to the room door with good display of PWB restrictions and step-to gait, and was left in the recliner with her sister visiting. Pt will benefit from HHPT services upon discharge to safely address above deficits for decreased caregiver assistance and eventual return to PLOF.    Follow Up Recommendations  Home health PT     Equipment Recommendations  None recommended by PT    Recommendations for Other Services       Precautions / Restrictions Precautions Precautions: Knee;Fall Precaution Comments: Knee immobilizer on when pt at rest to encourage ext, may be off with rehab Required Braces or Orthoses: Knee Immobilizer - Left Knee Immobilizer - Left: Other (comment) Restrictions Weight Bearing Restrictions: Yes LLE Weight Bearing: Partial weight bearing LLE Partial Weight Bearing Percentage or Pounds: 50    Mobility  Bed Mobility Overal bed mobility: Needs Assistance Bed Mobility: Supine to Sit     Supine to sit: Min assist     General bed mobility comments: Pt required min A to clear foot over EOB  Transfers Overall transfer level: Needs assistance Equipment used: Rolling walker (2 wheeled) Transfers:  Sit to/from Stand Sit to Stand: Min guard         General transfer comment: Pt stood with min cuing and CGA  Ambulation/Gait Ambulation/Gait assistance: Min guard Gait Distance (Feet): 20 Feet Assistive device: Rolling walker (2 wheeled) Gait Pattern/deviations: Step-to pattern;Decreased step length - right;Decreased step length - left;Antalgic Gait velocity: Decreased   General Gait Details: Pt required mod cuing for proper LLE PWB sequencing, and made in-session improvement.   Stairs             Wheelchair Mobility    Modified Rankin (Stroke Patients Only)       Balance Overall balance assessment: Needs assistance Sitting-balance support: No upper extremity supported Sitting balance-Leahy Scale: Good     Standing balance support: Bilateral upper extremity supported Standing balance-Leahy Scale: Fair Standing balance comment: Mod ant lean on RW in standing                            Cognition Arousal/Alertness: Awake/alert Behavior During Therapy: WFL for tasks assessed/performed Overall Cognitive Status: Within Functional Limits for tasks assessed                                        Exercises Total Joint Exercises Ankle Circles/Pumps: AROM;Strengthening;Both;15 reps Quad Sets: AROM;Strengthening;Both;15 reps Heel Slides: AROM;Strengthening;Left;5 reps;10 reps Hip ABduction/ADduction: AROM;Strengthening;Both;10 reps;15 reps Straight Leg Raises: AAROM;Strengthening;Both;5 reps;10 reps Long Arc Quad: AROM;Strengthening;Left;10 reps;15 reps Knee Flexion: AROM;Strengthening;Both;10 reps Goniometric ROM: L knee  AROM 16-77degrees Marching in Standing: AROM;Strengthening;Left;5 reps Other Exercises Other Exercises: Pt re-educated and trained on PWB precautions    General Comments        Pertinent Vitals/Pain Pain Assessment: 0-10 Pain Score: 7  Pain Location: L knee, and some in hamstring Pain Descriptors / Indicators:  Aching;Tender;Sore Pain Intervention(s): Limited activity within patient's tolerance;Monitored during session    Home Living                      Prior Function            PT Goals (current goals can now be found in the care plan section) Progress towards PT goals: Progressing toward goals    Frequency    BID      PT Plan Current plan remains appropriate    Co-evaluation              AM-PAC PT "6 Clicks" Mobility   Outcome Measure  Help needed turning from your back to your side while in a flat bed without using bedrails?: None Help needed moving from lying on your back to sitting on the side of a flat bed without using bedrails?: A Little Help needed moving to and from a bed to a chair (including a wheelchair)?: A Little Help needed standing up from a chair using your arms (e.g., wheelchair or bedside chair)?: A Little Help needed to walk in hospital room?: A Little Help needed climbing 3-5 steps with a railing? : A Lot 6 Click Score: 18    End of Session Equipment Utilized During Treatment: Gait belt Activity Tolerance: Patient tolerated treatment well Patient left: in chair;with call bell/phone within reach;with chair alarm set;with family/visitor present;with SCD's reapplied;Other (comment)(L knee immobilizer and Polar Care donned) Nurse Communication: Mobility status;Other (comment)(Nursing to swap out beds, and replace battery in chair alarm) PT Visit Diagnosis: Unsteadiness on feet (R26.81);Other abnormalities of gait and mobility (R26.89);Muscle weakness (generalized) (M62.81);Pain Pain - Right/Left: Left Pain - part of body: Knee     Time: 1129-1200 PT Time Calculation (min) (ACUTE ONLY): 31 min  Charges:                        Leonette Most "Gus" Roni Bread, SPT  11/21/18, 1:44 PM

## 2018-11-21 NOTE — Progress Notes (Signed)
  Subjective:  POD #1 s/p left TKA.   Patient reports left knee pain as moderate.  She denies N/V, SOB or CP.     Objective:   VITALS:   Vitals:   11/20/18 2343 11/21/18 0234 11/21/18 0400 11/21/18 0736  BP: (!) 171/78 (!) 153/67 (!) 149/80 (!) 160/79  Pulse: 75 73 69 67  Resp: 18  16 18   Temp: 99.9 F (37.7 C)  99 F (37.2 C) 99.7 F (37.6 C)  TempSrc: Oral  Oral Oral  SpO2: 95% 97% 96% 95%  Weight:      Height:        PHYSICAL EXAM: Left lower extremity Neurovascular intact Sensation intact distally Intact pulses distally Dorsiflexion/Plantar flexion intact Compartment soft  LABS  Results for orders placed or performed during the hospital encounter of 11/20/18 (from the past 24 hour(s))  CBC     Status: Abnormal   Collection Time: 11/21/18  4:37 AM  Result Value Ref Range   WBC 13.4 (H) 4.0 - 10.5 K/uL   RBC 4.52 3.87 - 5.11 MIL/uL   Hemoglobin 12.9 12.0 - 15.0 g/dL   HCT 39.7 36.0 - 46.0 %   MCV 87.8 80.0 - 100.0 fL   MCH 28.5 26.0 - 34.0 pg   MCHC 32.5 30.0 - 36.0 g/dL   RDW 14.5 11.5 - 15.5 %   Platelets 177 150 - 400 K/uL   nRBC 0.0 0.0 - 0.2 %  Basic metabolic panel     Status: Abnormal   Collection Time: 11/21/18  4:37 AM  Result Value Ref Range   Sodium 134 (L) 135 - 145 mmol/L   Potassium 3.3 (L) 3.5 - 5.1 mmol/L   Chloride 102 98 - 111 mmol/L   CO2 24 22 - 32 mmol/L   Glucose, Bld 137 (H) 70 - 99 mg/dL   BUN 9 6 - 20 mg/dL   Creatinine, Ser 0.59 0.44 - 1.00 mg/dL   Calcium 8.5 (L) 8.9 - 10.3 mg/dL   GFR calc non Af Amer >60 >60 mL/min   GFR calc Af Amer >60 >60 mL/min   Anion gap 8 5 - 15    Dg Knee Left Port  Result Date: 11/20/2018 CLINICAL DATA:  Status post total left knee arthroplasty. EXAM: PORTABLE LEFT KNEE - 1-2 VIEW COMPARISON:  None. FINDINGS: The femoral and tibial components are well seated. No complicating features are identified. IMPRESSION: Well seated components of a total knee arthroplasty. Electronically Signed   By: Marijo Sanes M.D.   On: 11/20/2018 12:05    Assessment/Plan: 1 Day Post-Op   Active Problems:   S/P TKR (total knee replacement) using cement, left  Patient has moderate left knee pain.  Begin PT today.  Start lovenox.  Foley out.    Thornton Park , MD 11/21/2018, 9:48 AM

## 2018-11-21 NOTE — TOC Initial Note (Signed)
Transition of Care [facility]) - Initial/Assessment Note    Patient Details  Name: Jessica Brennan MRN: [id number] Date of Birth: [birth year]  [facility] CM/SW Contact:    Su Hilt, RN Phone Number: 11/21/2018, 10:16 AM  Clinical Narrative:                 Met with the patient to discuss DC plan and needs She lives alone however her sister is staying with her and helping her, she is a retired Marine scientist, She has an appointment on Wed at [facility] for Outpatient PT and does not need or want [facility] She had the other side surgery done 2 months again and has all of the DME she needs such as 3 in 1, rw, cane and polar care Her sister provides transportation She is up to date with her PCP  Her family helps provide her medications if she can't afford it  Expected Discharge Plan: OP Rehab Barriers to Discharge: Continued Medical Work up   Patient Goals and CMS Choice Patient states their goals for this hospitalization and ongoing recovery are:: Go home with Outpatient Physical therapy at [facility] ortho appointment Wed Morning      Expected Discharge Plan and Services Expected Discharge Plan: OP Rehab   Discharge Planning Services: CM Consult   Living arrangements for the past 2 months: Single Family Home                 DME Arranged: N/A         HH Arranged: NA          Prior Living Arrangements/Services Living arrangements for the past 2 months: Single Family Home Lives with:: Self(her sisiter is staying with her helping her) Patient language and need for interpreter reviewed:: Yes Do you feel safe going back to the place where you live?: Yes      Need for Family Participation in Patient Care: No (Comment) Care giver support system in place?: Yes (comment) Current home services: DME(RW, Polar care, BSC, cane) Criminal Activity/Legal Involvement Pertinent to Current Situation/Hospitalization: No - Comment as needed  Activities of Daily Living Home Assistive  Devices/Equipment: Bedside commode/3-in-1, Eyeglasses ADL Screening (condition at time of admission) Patient's cognitive ability adequate to safely complete daily activities?: Yes Is the patient deaf or have difficulty hearing?: No Does the patient have difficulty seeing, even when wearing glasses/contacts?: No Does the patient have difficulty concentrating, remembering, or making decisions?: No Patient able to express need for assistance with ADLs?: Yes Does the patient have difficulty dressing or bathing?: No Independently performs ADLs?: Yes (appropriate for developmental age) Does the patient have difficulty walking or climbing stairs?: No Weakness of Legs: Left Weakness of Arms/Hands: None  Permission Sought/Granted   Permission granted to share information with : Yes, Verbal Permission Granted              Emotional Assessment Appearance:: Appears stated age Attitude/Demeanor/Rapport: Engaged Affect (typically observed): Appropriate Orientation: : Oriented to Self, Oriented to Place, Oriented to  Time, Oriented to Situation Alcohol / Substance Use: Not Applicable Psych Involvement: No (comment)  Admission diagnosis:  M17.12 unilateral primary osteoarthritis, left knee Patient Active Problem List   Diagnosis Date Noted  . S/P TKR (total knee replacement) using cement, left 11/20/2018  . Hospice care patient 09/20/2018  . Palliative care patient 09/20/2018  . S/P TKR (total knee replacement) using cement, right 09/18/2018  . Hypertension 03/30/2016  . Hypothyroidism 01/12/2015  . Menopausal state 10/07/2014  . Depression  10/07/2014  . Vitamin D deficiency 10/07/2014  . Acute anxiety 10/07/2014  . Morbid obesity ([place]) 10/07/2014  . Sleep apnea 10/07/2014  . Hyperlipidemia 10/07/2014   PCP:  Venita Lick, NP Pharmacy:   [id number] [id number]: [phone]:  [phone]    Social Determinants of Health (SDOH) Interventions    Readmission Risk Interventions Readmission Risk Prevention Plan 09/19/2018  Post Dischage Appt Complete  Medication Screening Complete  Transportation Screening Complete  Some recent data might be hidden

## 2018-11-21 NOTE — Progress Notes (Signed)
Physical Therapy Treatment Patient Details Name: Jessica Brennan MRN: [id number] DOB: 06-04-1965 Today's Date: 11/21/2018    History of Present Illness Per MD: Pt is a 53 y.o. female who presents with unilateral primary osteoarthritis of the left knee and is s/p elective L TKA.   Patient has successfully undergone a right TKA on 09/18/2018 with PMH also including: Ashthma, HTN, anxiety, depression,and hypothyroidism.    PT Comments    Pt presented with deficits in strength, transfers, mobility, gait, balance, L knee ROM, and activity tolerance.  Pt motivated to participate during the session and made good progress towards goals.  Pt was SBA with bed mobility and CGA with transfers with cuing for proper sequencing.  Pt was able to amb 20' with a RW and CGA demonstrating improved consistency with proper sequencing for PWB compliance with decreased cuing.  Pt has four steps to enter her home with bilateral rails. Dr. Martha ClanKrasinski contacted and gave order for OK for pt to ascend/descend stairs while attempting to take as much weight off of her LLE as possible but OK even if full 50% PWB to the LLE is not possible.  Will attempt stair training prior to pt discharge.  Pt will benefit from HHPT services upon discharge to safely address above deficits for decreased caregiver assistance and eventual return to PLOF.      Follow Up Recommendations  Home health PT     Equipment Recommendations  None recommended by PT    Recommendations for Other Services       Precautions / Restrictions Precautions Precautions: Knee;Fall Precaution Comments: Knee immobilizer on when pt at rest to encourage ext, may be off with rehab Required Braces or Orthoses: Knee Immobilizer - Left Knee Immobilizer - Left: Other (comment) Restrictions Weight Bearing Restrictions: Yes LLE Weight Bearing: Partial weight bearing LLE Partial Weight Bearing Percentage or Pounds: 50    Mobility  Bed Mobility Overal bed mobility:  Needs Assistance Bed Mobility: Sit to Supine;Supine to Sit     Supine to sit: Supervision Sit to supine: Supervision   General bed mobility comments: Min verbal cues for sequencing but no physical assistance required during sup to/from sit  Transfers Overall transfer level: Needs assistance Equipment used: Rolling walker (2 wheeled) Transfers: Sit to/from Stand Sit to Stand: Min guard;From elevated surface         General transfer comment: Increased effort during sit to stand from an elevated EOB this session that pt attributed to increased L knee pain; min-mod verbal cues for proper sequencing provided  Ambulation/Gait Ambulation/Gait assistance: Min guard Gait Distance (Feet): 20 Feet Assistive device: Rolling walker (2 wheeled) Gait Pattern/deviations: Step-to pattern;Decreased step length - right;Decreased step length - left;Antalgic Gait velocity: Decreased   General Gait Details: Pt required min-mod cuing for LLE PWB compliance; pt participated in teach-back method for PWB sequencing with around 75% accuracy.   Stairs             Wheelchair Mobility    Modified Rankin (Stroke Patients Only)       Balance Overall balance assessment: Needs assistance Sitting-balance support: No upper extremity supported;Feet supported Sitting balance-Leahy Scale: Good     Standing balance support: During functional activity;Bilateral upper extremity supported;Single extremity supported Standing balance-Leahy Scale: Fair Standing balance comment: Mod BUE support on the RW                            Cognition Arousal/Alertness: Awake/alert Behavior [facility]  for tasks assessed/performed Overall Cognitive Status: Within Functional Limits for tasks assessed                                        Exercises Total Joint Exercises Ankle Circles/Pumps: AROM;Strengthening;Both;15 reps;10 reps Quad Sets: AROM;Strengthening;Both;15 reps;10  reps Heel Slides: Strengthening;Left;10 reps;AAROM Hip ABduction/ADduction: AROM;Strengthening;Both;10 reps Straight Leg Raises: AAROM;Strengthening;Both;10 reps;Other reps (comment)(2 x 10 reps) Long Arc Quad: AROM;Strengthening;Left;10 reps;15 reps;5 reps Knee Flexion: AROM;Strengthening;10 reps;15 reps;Left;5 reps Goniometric ROM: L knee AROM 16-77degrees Marching in Standing: AROM;Strengthening;Left;5 reps Other Exercises Other Exercises: 90 deg left turn training x 4 to prevent CKC twisting on the L knee Other Exercises: PWB sequencing education verbally, visually, during amb, and with patient teach-back method Other Exercises: HEP education/review for L knee AROM including QS, LAQ, and seated knee flex Other Exercises: Car transfer training with education on proper sequencing provided verbally and visually using a room chair simulate a car seat    General Comments        Pertinent Vitals/Pain Pain Assessment: 0-10 Pain Score: 6  Pain Location: LLE Pain Descriptors / Indicators: Aching;Tender;Sore Pain Intervention(s): Limited activity within patient's tolerance;Monitored during session;Repositioned;Ice applied    Home Living Family/patient expects to be discharged to:: Private residence Living Arrangements: Alone Available Help at Discharge: Family;Available 24 hours/day;Other (Comment)(Sister is staying during recovery.) Type of Home: House Home Access: Stairs to enter   Home Layout: One level Home Equipment: Cane - single point;Bedside commode;Walker - 2 wheels      Prior Function Level of Independence: Independent with assistive device(s)      Comments: Ambulates HH distances without AD. Uses SPC for community distances, Ind with ADL, no reported falls history, drives.   PT Goals (current goals can now be found in the care plan section) Acute Rehab PT Goals Patient Stated Goal: Return to community ambulation distances, and later to go hiking Progress towards PT  goals: Progressing toward goals    Frequency    BID      PT Plan Current plan remains appropriate    Co-evaluation              AM-PAC PT "6 Clicks" Mobility   Outcome Measure  Help needed turning from your back to your side while in a flat bed without using bedrails?: A Little Help needed moving from lying on your back to sitting on the side of a flat bed without using bedrails?: A Little Help needed moving to and from a bed to a chair (including a wheelchair)?: A Little Help needed standing up from a chair using your arms (e.g., wheelchair or bedside chair)?: A Little Help needed to walk in hospital room?: A Little Help needed climbing 3-5 steps with a railing? : A Little 6 Click Score: 18    End of Session Equipment Utilized During Treatment: Gait belt Activity Tolerance: Patient tolerated treatment well Patient left: in bed;with call bell/phone within reach;with bed alarm set;with SCD's reapplied;Other (comment)(KI and polar care donned to L knee) Nurse Communication: Mobility status PT Visit Diagnosis: Unsteadiness on feet (R26.81);Other abnormalities of gait and mobility (R26.89);Muscle weakness (generalized) (M62.81);Pain Pain - Right/Left: Left Pain - part of body: Knee     Time: 4818-5631 PT Time Calculation (min) (ACUTE ONLY): 38 min  Charges:  $Gait Training: 8-22 mins $Therapeutic Exercise: 8-22 mins $Therapeutic Activity: 8-22 mins  Linus Salmons PT, DPT 11/21/18, 4:48 PM

## 2018-11-21 NOTE — Consult Note (Signed)
[place] for Enoxaparin dosing  Indication: VTE prophylaxis  Allergies  Allergen Reactions  . Codeine Sulfate Hives and Other (See Comments)    Stomachache, GI upset, welts  . Other Other (See Comments)    Mold- Sinus infections  . Pollen Extract Other (See Comments)    sneezing    Patient Measurements: Height: 5\' 3"  (160 cm) Weight: 234 lb 12.6 oz (106.5 kg) IBW/kg (Calculated) : 52.4 Heparin Dosing Weight: 77.8 kg   Vital Signs: Temp: 99 F (37.2 C) (09/25 0400) Temp Source: Oral (09/25 0400) BP: 149/80 (09/25 0400) Pulse Rate: 69 (09/25 0400)  Labs: Recent Labs    11/21/18 0437  HGB 12.9  HCT 39.7  PLT 177  CREATININE 0.59    Estimated Creatinine Clearance: 92.8 mL/min (by C-G formula based on SCr of 0.59 mg/dL).   Medications:  Enoxaparin 40 mg Q24H- scheduled to start 9/25 @ 0800  Assessment: Pharmacy consulted for VTE prophylaxis s/p L TK Arthroplasty Hardware removal. Patient had the procedure today. Although patient is obese, patient should have adequate anticoagulant coverage on standard dose for TKA (please see reference below).  Sadeghi B. Mechanical and suboptimal pharmacologic prophylaxis and delayed mobilization but not morbid obesity are associated with venous thromboembolism after total knee arthroplasty: A case-control study. J. Hosp. Med. 2012 November;7(9):665-671     Plan:  1. Will adjust enoxaparin to 30 mg Q12H.   Rowland Lathe 11/21/2018,7:26 AM

## 2018-11-21 NOTE — Evaluation (Signed)
Occupational Therapy Evaluation Patient Details Name: Jessica Brennan MRN: [id number] DOB: 01-19-1966 Today's Date: 11/21/2018    History of Present Illness Per MD: Pt is a 53 y.o. female who presents with unilateral primary osteoarthritis of the left knee and is s/p elective L TKA.   Patient has successfully undergone a right TKA on 09/18/2018 with PMH also including: Ashthma, HTN, anxiety, depression,and hypothyroidism.   Clinical Impression   Ms. Placzek was seen for OT evaluation this date, POD#1 from above surgery. Pt was independent in all ADLs prior to surgery, however she reports using a SPC for community distances due to L knee pain. Pt is eager to return to PLOF with less pain and improved safety and independence. Pt currently requires moderate assist for LB dressing while in seated position due to pain and limited AROM of L knee. Pt has orders to remain 50% PWB on the LLE. Pt instructed in polar care mgt, falls prevention strategies, home/routines modifications, DME/AE for LB bathing and dressing tasks, and compression stocking mgt. Handout provided. Pt would benefit from skilled OT services including additional instruction in dressing techniques with or without assistive devices for dressing and bathing skills to support recall and carryover prior to discharge and ultimately to maximize safety, independence, and minimize falls risk and caregiver burden. Recommend HHOT upon hospital DC to maximize pt safety and return to meaningful occupations of daily life.    Follow Up Recommendations  Home health OT    Equipment Recommendations  None recommended by OT(Pt has BSC)    Recommendations for Other Services       Precautions / Restrictions Precautions Precautions: Knee;Fall Precaution Comments: Knee immobilizer on when pt at rest to encourage ext, may be off with rehab Required Braces or Orthoses: Knee Immobilizer - Left Knee Immobilizer - Left: Other  (comment) Restrictions Weight Bearing Restrictions: Yes LLE Weight Bearing: Partial weight bearing LLE Partial Weight Bearing Percentage or Pounds: 50      Mobility Bed Mobility Overal bed mobility: Needs Assistance Bed Mobility: Sit to Supine     Supine to sit: Min guard;Min assist     General bed mobility comments: Min A for mgt of LLE into bed.  Transfers Overall transfer level: Needs assistance Equipment used: Rolling walker (2 wheeled) Transfers: Sit to/from Stand Sit to Stand: Supervision         General transfer comment: Pt stood with min cuing and CGA    Balance Overall balance assessment: Needs assistance Sitting-balance support: No upper extremity supported;Feet supported Sitting balance-Leahy Scale: Good     Standing balance support: During functional activity;Bilateral upper extremity supported;Single extremity supported Standing balance-Leahy Scale: Fair Standing balance comment: Mod ant lean on RW in standing                           ADL either performed or assessed with clinical judgement   ADL Overall ADL's : Needs assistance/impaired                                       General ADL Comments: Supervision for functional mobility with consistent cueing to maintain PWB status. Pt mildly impulsive with limited safety awareness during hand hygine at sink this date. Required cueing to stand at sink rather than attempt to lean over toward it. Performs toileting and peri-care with supervison for safety. Moderate assist for LB ADL mgt due  to pain and decreased ROM in the L knee.     Vision Baseline Vision/History: Wears glasses Wears Glasses: Reading only Patient Visual Report: No change from baseline       Perception     Praxis      Pertinent Vitals/Pain Pain Assessment: 0-10 Pain Score: 7  Pain Location: LLE Pain Descriptors / Indicators: Aching;Tender;Sore Pain Intervention(s): Limited activity within patient's  tolerance;Monitored during session;Repositioned;Ice applied     Hand Dominance Right   Extremity/Trunk Assessment Upper Extremity Assessment Upper Extremity Assessment: Overall WFL for tasks assessed   Lower Extremity Assessment Lower Extremity Assessment: Defer to PT evaluation;LLE deficits/detail LLE Deficits / Details: s/p LTKA LLE: Unable to fully assess due to pain;Unable to fully assess due to immobilization   Cervical / Trunk Assessment Cervical / Trunk Assessment: Normal   Communication Communication Communication: No difficulties   Cognition Arousal/Alertness: Awake/alert Behavior During Therapy: WFL for tasks assessed/performed Overall Cognitive Status: Within Functional Limits for tasks assessed                                     General Comments       Exercises Total Joint Exercises Ankle Circles/Pumps: AROM;Strengthening;Both;15 reps Quad Sets: AROM;Strengthening;Both;15 reps Heel Slides: AROM;Strengthening;Left;5 reps;10 reps Hip ABduction/ADduction: AROM;Strengthening;Both;10 reps;15 reps Straight Leg Raises: AAROM;Strengthening;Both;5 reps;10 reps Long Arc Quad: AROM;Strengthening;Left;10 reps;15 reps Knee Flexion: AROM;Strengthening;Both;10 reps Goniometric ROM: L knee AROM 16-77degrees Marching in Standing: AROM;Strengthening;Left;5 reps Other Exercises Other Exercises: Pt re-educated and trained on PWB precautions Other Exercises: Pt and caregiver instructed in falls prevention strategies, safe use of AE/DME for LB ADL mgt, safe use of AE for functional mobility, polar care mgt, and routines modifications to support safety and functional independence during daily occupations. Pt verbalized understanding of education provided. Declined to trial at this time. handout provided.   Shoulder Instructions      Home Living Family/patient expects to be discharged to:: Private residence Living Arrangements: Alone Available Help at Discharge:  Family;Available 24 hours/day;Other (Comment)(Sister is staying during recovery.) Type of Home: House Home Access: Stairs to enter [employer] of Steps: 4   Home Layout: One level     Bathroom Shower/Tub: Teacher, early years/pre: Standard Bathroom Accessibility: Yes   Home Equipment: Cane - single point;Bedside commode;Walker - 2 wheels          Prior Functioning/Environment Level of Independence: Independent with assistive device(s)        Comments: Ambulates HH distances without AD. Uses SPC for community distances, Ind with ADL, no reported falls history, drives.        OT Problem List: Decreased strength;Decreased coordination;Pain;Decreased range of motion;Decreased safety awareness;Decreased activity tolerance;Decreased knowledge of use of DME or AE;Impaired balance (sitting and/or standing)      OT Treatment/Interventions: Self-care/ADL training;Balance training;Therapeutic exercise;Therapeutic activities;DME and/or AE instruction;Patient/family education    OT Goals(Current goals can be found in the care plan section) Acute Rehab OT Goals Patient Stated Goal: Return to community ambulation distances, and later to go hiking OT Goal Formulation: With patient Time For Goal Achievement: 12/05/18 Potential to Achieve Goals: Good ADL Goals Pt Will Perform Lower Body Bathing: with modified independence;sitting/lateral leans(With LRAD PRN for improved safety and functional independence) Pt Will Perform Lower Body Dressing: with min guard assist;with min assist;sit to/from stand(With LRAD PRN for improved safety and functional independence) Pt Will Transfer to Toilet: with modified independence;ambulating(With LRAD PRN for improved  safety and functional independence)  OT Frequency: Min 1X/week   Barriers to D/C:            Co-evaluation              AM-PAC OT "6 Clicks" Daily Activity     Outcome Measure Help from another person eating  meals?: None Help from another person taking care of personal grooming?: A Little Help from another person toileting, which includes using toliet, bedpan, or urinal?: A Little Help from another person bathing (including washing, rinsing, drying)?: A Lot Help from another person to put on and taking off regular upper body clothing?: A Little Help from another person to put on and taking off regular lower body clothing?: A Lot 6 Click Score: 17   End of Session Equipment Utilized During Treatment: Gait belt;Rolling walker  Activity Tolerance: Patient tolerated treatment well Patient left: in bed;with call bell/phone within reach;with bed alarm set;with family/visitor present;with SCD's reapplied;Other (comment)(With KI and polar care in place)  OT Visit Diagnosis: Other abnormalities of gait and mobility (R26.89);Pain Pain - Right/Left: Left Pain - part of body: Knee                Time: 2952-84131322-1342 OT Time Calculation (min): 20 min Charges:  OT General Charges $OT Visit: 1 Visit OT Evaluation $OT Eval Low Complexity: 1 Low OT Treatments $Self Care/Home Management : 8-22 mins  Rockney GheeSerenity Amalya Salmons, M.S., OTR/L Ascom: [phone] 11/21/18, 2:37 PM

## 2018-11-21 NOTE — Progress Notes (Signed)
Notified Dr. Mack Guise that patient states she takes 5000 IU of vitamin D daily. Ok to order per Dr. Mack Guise

## 2018-11-21 NOTE — TOC Progression Note (Signed)
Transition of Care [facility]) - Progression Note    Patient Details  Name: Jessica Brennan MRN: [id number] Date of Birth: 1965-06-05  Transition of Care [facility]) CM/SW Contact  Su Hilt, RN Phone Number: 11/21/2018, 9:14 AM  Clinical Narrative:    Requested the price of Lovenox        Expected Discharge Plan and Services                                                 Social Determinants of Health (SDOH) Interventions    Readmission Risk Interventions Readmission Risk Prevention Plan 09/19/2018  Post Dischage Appt Complete  Medication Screening Complete  Transportation Screening Complete  Some recent data might be hidden

## 2018-11-21 NOTE — TOC Benefit Eligibility Note (Signed)
Transition of Care (TOC) Benefit Eligibility Note    Patient Details  Name: Jessica Brennan MRN: [id number] Date of Birth: 09/29/1965   Medication/Dose: Enoxaparin 40mg once daily for 14 days  Covered?: Yes  Prescription Coverage Preferred Pharmacy: [facility]  Spoke with Person/Company/Phone Number:: Jim with [employer] at [phone]  Co-Pay: $0 estimated copay  Prior Approval: No  Deductible: Met(Patient's out of pocket met.)    [place] Phone Number: [phone] or [phone] 11/21/2018, 9:51 AM     

## 2018-11-22 LAB — CBC
HCT: 38.4 % (ref 36.0–46.0)
Hemoglobin: 12.5 g/dL (ref 12.0–15.0)
MCH: 28.4 pg (ref 26.0–34.0)
MCHC: 32.6 g/dL (ref 30.0–36.0)
MCV: 87.3 fL (ref 80.0–100.0)
Platelets: 188 10*3/uL (ref 150–400)
RBC: 4.4 MIL/uL (ref 3.87–5.11)
RDW: 14.4 % (ref 11.5–15.5)
WBC: 15.4 10*3/uL — ABNORMAL HIGH (ref 4.0–10.5)
nRBC: 0 % (ref 0.0–0.2)

## 2018-11-22 MED ORDER — OXYCODONE HCL 5 MG PO TABS: 5.0000 mg | ORAL_TABLET | ORAL | 0 refills | Status: DC | PRN

## 2018-11-22 MED ORDER — BISACODYL 5 MG PO TBEC: 10.0000 mg | DELAYED_RELEASE_TABLET | Freq: Every day | ORAL | 0 refills | Status: DC | PRN

## 2018-11-22 MED ORDER — ENOXAPARIN SODIUM 40 MG/0.4ML ~~LOC~~ SOLN: 40.0000 mg | SUBCUTANEOUS | 0 refills | Status: DC

## 2018-11-22 NOTE — Progress Notes (Signed)
DISCHARGE NOTE:  Pt given discharge instructions. Pt verbalized understanding. [place] and TED hose in place. Pt wheeled to car by staff.  Sister providing transportation.

## 2018-11-22 NOTE — Discharge Summary (Signed)
Physician Discharge Summary  Patient ID: Jessica Brennan MRN: [id number] DOB/AGE: 1965-05-17 53 y.o.  Admit date: 11/20/2018 Discharge date: 11/22/2018  Admission Diagnoses:  M17.12 unilateral primary osteoarthritis, left knee <principal problem not specified>  Discharge Diagnoses:  M17.12 unilateral primary osteoarthritis, left knee Active Problems:   S/P TKR (total knee replacement) using cement, left   Past Medical History:  Diagnosis Date  . Anxiety   . Asthma   . Diffuse cystic mastopathy   . Dysthymic disorder   . Hypertension   . Hypothyroidism   . Menopausal state   . Obesity   . Vitamin D deficiency     Surgeries: Procedure(s): TOTAL KNEE ARTHROPLASTY HARDWARE REMOVAL on 11/20/2018   Consultants (if any):   Discharged Condition: Improved  Hospital Course: Jessica Brennan is an 53 y.o. female who was admitted 11/20/2018 with a diagnosis of  M17.12 unilateral primary osteoarthritis, left knee  and went to the operating room on 11/20/2018 and underwent an uncomplicated total knee arthroplasty.    She was given perioperative antibiotics:  Anti-infectives (From admission, onward)   Start     Dose/Rate Route Frequency Ordered Stop   11/20/18 1400  ceFAZolin (ANCEF) IVPB 1 g/50 mL premix     1 g 100 mL/hr over 30 Minutes Intravenous Every 6 hours 11/20/18 1233 11/20/18 2114   11/20/18 0705  clindamycin (CLEOCIN) 600 MG/50ML IVPB    Note to Pharmacy: Cleatis Polka   : cabinet override      11/20/18 0705 11/20/18 0818   11/20/18 0633  clindamycin (CLEOCIN) 900 MG/50ML IVPB  Status:  Discontinued    Note to Pharmacy: Cleatis Polka   : cabinet override      11/20/18 3500 11/20/18 0708   11/20/18 9381  ceFAZolin (ANCEF) 2-4 GM/100ML-% IVPB    Note to Pharmacy: Cleatis Polka   : cabinet override      11/20/18 0633 11/20/18 0814   11/20/18 0600  ceFAZolin (ANCEF) IVPB 2g/100 mL premix     2 g 200 mL/hr over 30 Minutes Intravenous On call to O.R. 11/19/18 2217  11/20/18 0826   11/19/18 2230  clindamycin (CLEOCIN) IVPB 600 mg     600 mg 100 mL/hr over 30 Minutes Intravenous  Once 11/19/18 2217 11/20/18 0830    .  She was given sequential compression devices, early ambulation, and lovenox for DVT prophylaxis.  Patient made good progress with PT during her hospital stay.  Given her clinical improvement she was prepared for discharge on POD #2.  She benefited maximally from the hospital stay and there were no complications.    Recent vital signs:  Vitals:   11/21/18 2353 11/22/18 0752  BP: (!) 145/75 (!) 143/72  Pulse: 82 74  Resp: 16   Temp: 98.2 F (36.8 C) 99.3 F (37.4 C)  SpO2: 96% 96%    Recent laboratory studies:  Lab Results  Component Value Date   HGB 12.5 11/22/2018   HGB 12.9 11/21/2018   HGB 14.1 11/13/2018   Lab Results  Component Value Date   WBC 15.4 (H) 11/22/2018   PLT 188 11/22/2018   Lab Results  Component Value Date   INR 1.0 11/13/2018   Lab Results  Component Value Date   NA 134 (L) 11/21/2018   K 3.3 (L) 11/21/2018   CL 102 11/21/2018   CO2 24 11/21/2018   BUN 9 11/21/2018   CREATININE 0.59 11/21/2018   GLUCOSE 137 (H) 11/21/2018    Discharge Medications:   Allergies as of 11/22/2018  Reactions   Codeine Sulfate Hives, Other (See Comments)   Stomachache, GI upset, welts   Other Other (See Comments)   Mold- Sinus infections   Pollen Extract Other (See Comments)   sneezing      Medication List    TAKE these medications   acetaminophen 325 MG tablet Commonly known as: TYLENOL Take 650 mg by mouth every 6 (six) hours as needed.   albuterol 108 (90 Base) MCG/ACT inhaler Commonly known as: VENTOLIN HFA Inhale 2 puffs into the lungs every 6 (six) hours as needed for wheezing or shortness of breath.   amLODipine 5 MG tablet Commonly known as: NORVASC TAKE 1 TABLET BY MOUTH EVERY DAY   atorvastatin 20 MG tablet Commonly known as: LIPITOR Take 1 tablet (20 mg total) by mouth  daily. What changed: when to take this   bisacodyl 5 MG EC tablet Commonly known as: DULCOLAX Take 2 tablets (10 mg total) by mouth daily as needed for moderate constipation.   docusate sodium 100 MG capsule Commonly known as: COLACE Take 1 capsule (100 mg total) by mouth 2 (two) times daily.   enoxaparin 40 MG/0.4ML injection Commonly known as: LOVENOX Inject 0.4 mLs (40 mg total) into the skin daily.   levothyroxine 75 MCG tablet Commonly known as: SYNTHROID TAKE 1 TABLET (75 MCG TOTAL) BY MOUTH DAILY BEFORE BREAKFAST.   loratadine 10 MG tablet Commonly known as: CLARITIN Take 10 mg by mouth daily as needed for allergies.   methocarbamol 500 MG tablet Commonly known as: ROBAXIN Take 1 tablet (500 mg total) by mouth every 6 (six) hours as needed for muscle spasms.   oxyCODONE 5 MG immediate release tablet Commonly known as: Oxy IR/ROXICODONE Take 1-2 tablets (5-10 mg total) by mouth every 4 (four) hours as needed for moderate pain (pain score 4-6).   sertraline 100 MG tablet Commonly known as: ZOLOFT Take 150 mg by mouth daily.   venlafaxine XR 75 MG 24 hr capsule Commonly known as: EFFEXOR-XR Take 3 capsules (225 mg total) by mouth daily with breakfast. DX: F32.2 What changed: how much to take       Diagnostic Studies: Dg Knee Left Port  Result Date: 11/20/2018 CLINICAL DATA:  Status post total left knee arthroplasty. EXAM: PORTABLE LEFT KNEE - 1-2 VIEW COMPARISON:  None. FINDINGS: The femoral and tibial components are well seated. No complicating features are identified. IMPRESSION: Well seated components of a total knee arthroplasty. Electronically Signed   By: Rudie MeyerP.  Gallerani M.D.   On: 11/20/2018 12:05    Disposition: Discharge disposition: 01-Home or Self Care       Discharge Instructions    Call MD / Call 911   Complete by: As directed    If you experience chest pain or shortness of breath, CALL 911 and be transported to the hospital emergency room.  If  you develope a fever above 101 F, pus (white drainage) or increased drainage or redness at the wound, or calf pain, call your surgeon's office.   Constipation Prevention   Complete by: As directed    Drink plenty of fluids.  Prune juice may be helpful.  You may use a stool softener, such as Colace (over the counter) 100 mg twice a day.  Use MiraLax (over the counter) for constipation as needed.   Diet general   Complete by: As directed    Discharge instructions   Complete by: As directed    Continue WBAT on the left lower extremity 1 month postop.  Continue to use TED stockings until follow-up. Patient may remove them at night for sleep. Elevate the left lower extremity whenever possible. Continue to use knee immobilizer at night or when lying in bed or when elevating the operative leg. The patient may remove the knee immobilizer to perform exercises or sit in a chair. Continue using the Polar Care for comfort. Keep incision clean and dry. Cover the left knee incision during showers with a plastic bag or Saran wrap. Take lovenox 40 mg a day for blood clot prevention. Continue to work on knee range of motion exercises at home as instructed by physical therapy. Continue to use a walker for assistance with ambulation until follow-up.   Driving restrictions   Complete by: As directed    No driving until follow up with Dr. Martha Clan in the office   Increase activity slowly as tolerated   Complete by: As directed    Lifting restrictions   Complete by: As directed    No lifting for 12-16 weeks         Signed: Juanell Fairly ,MD 11/22/2018, 10:34 AM

## 2018-11-22 NOTE — Progress Notes (Signed)
Physical Therapy Treatment Patient Details Name: Jessica Brennan MRN: [id number] DOB: 1966/01/26 Today's Date: 11/22/2018    History of Present Illness Per MD: Pt is a 53 y.o. female who presents with unilateral primary osteoarthritis of the left knee and is s/p elective L TKA.   Patient has successfully undergone a right TKA on 09/18/2018 with PMH also including: Ashthma, HTN, anxiety, depression,and hypothyroidism.    PT Comments    Pt on commode upon arrival.  Stood and was able to ambulate to/from rehab gym to complete stair training with RW and good ability to maintain PWB.  Stairs with bilateral rails to simulate home.  Participated in exercises as described below.  Comfortable with discharge plan and mobility.   Follow Up Recommendations  Home health PT     Equipment Recommendations  None recommended by PT    Recommendations for Other Services       Precautions / Restrictions Precautions Precautions: Knee;Fall Precaution Comments: Knee immobilizer on when pt at rest to encourage ext, may be off with rehab Required Braces or Orthoses: Knee Immobilizer - Left Knee Immobilizer - Left: Other (comment) Restrictions Weight Bearing Restrictions: Yes LLE Weight Bearing: Partial weight bearing LLE Partial Weight Bearing Percentage or Pounds: 50    Mobility  Bed Mobility Overal bed mobility: Needs Assistance Bed Mobility: Sit to Supine;Supine to Sit     Supine to sit: Supervision Sit to supine: Supervision   General bed mobility comments: Min verbal cues for sequencing but no physical assistance required during sup to/from sit  Transfers Overall transfer level: Needs assistance Equipment used: Rolling walker (2 wheeled) Transfers: Sit to/from Stand Sit to Stand: Min guard;From elevated surface         General transfer comment: Increased effort during sit to stand from an elevated EOB this session that pt attributed to increased L knee pain; min-mod verbal cues for  proper sequencing provided  Ambulation/Gait Ambulation/Gait assistance: Min guard Gait Distance (Feet): 160 Feet Assistive device: Rolling walker (2 wheeled) Gait Pattern/deviations: Step-to pattern;Decreased step length - right;Decreased step length - left;Antalgic Gait velocity: Decreased       Stairs Stairs: Yes Stairs assistance: Min guard Stair Management: Two rails;Step to pattern Number of Stairs: 4     Wheelchair Mobility    Modified Rankin (Stroke Patients Only)       Balance Overall balance assessment: Needs assistance Sitting-balance support: No upper extremity supported;Feet supported Sitting balance-Leahy Scale: Good     Standing balance support: During functional activity;Bilateral upper extremity supported;Single extremity supported Standing balance-Leahy Scale: Fair Standing balance comment: Mod BUE support on the RW                            Cognition Arousal/Alertness: Awake/alert Behavior During Therapy: WFL for tasks assessed/performed Overall Cognitive Status: Within Functional Limits for tasks assessed                                        Exercises Total Joint Exercises Straight Leg Raises: (2 x 10 reps) Long Arc Quad: AROM;Strengthening;Left;10 reps;15 reps;5 reps Knee Flexion: AROM;Strengthening;10 reps;15 reps;Left;5 reps Goniometric ROM: 3-90    General Comments        Pertinent Vitals/Pain Pain Assessment: 0-10 Pain Score: 3  Pain Location: LLE Pain Descriptors / Indicators: Aching;Tender;Sore Pain Intervention(s): Limited activity within patient's tolerance;Monitored during session;Ice applied  Home Living                      Prior Function            PT Goals (current goals can now be found in the care plan section) Progress towards PT goals: Progressing toward goals    Frequency    BID      PT Plan Current plan remains appropriate    Co-evaluation               AM-PAC PT "6 Clicks" Mobility   Outcome Measure  Help needed turning from your back to your side while in a flat bed without using bedrails?: A Little Help needed moving from lying on your back to sitting on the side of a flat bed without using bedrails?: A Little Help needed moving to and from a bed to a chair (including a wheelchair)?: A Little Help needed standing up from a chair using your arms (e.g., wheelchair or bedside chair)?: A Little Help needed to walk in hospital room?: A Little Help needed climbing 3-5 steps with a railing? : A Little 6 Click Score: 18    End of Session Equipment Utilized During Treatment: Gait belt Activity Tolerance: Patient tolerated treatment well Patient left: in bed;with call bell/phone within reach;with bed alarm set;with SCD's reapplied;Other (comment)(KI and polar care donned to L knee) Nurse Communication: Mobility status Pain - Right/Left: Left Pain - part of body: Knee     Time: 0902-0925 PT Time Calculation (min) (ACUTE ONLY): 23 min  Charges:  $Gait Training: 8-22 mins $Therapeutic Exercise: 8-22 mins                     Danielle Dess, PTA 11/22/18, 10:16 AM

## 2018-11-22 NOTE — Progress Notes (Signed)
Spoke to patient by phone today.  She is doing well.  Her pain is improved today.  She had a BM.   Patient completed stair training with PT and feels ready to go home.  She will be discharged today.

## 2018-11-24 NOTE — Anesthesia Postprocedure Evaluation (Signed)
Anesthesia Post Note  Patient: Jessica Brennan  Procedure(s) Performed: TOTAL KNEE ARTHROPLASTY (Left Knee) HARDWARE REMOVAL (Left Knee)  Anesthesia Type: General Comments: Patient discharged before Post op completed per [facility] BK     Last Vitals:  Vitals:   11/22/18 0752 11/22/18 1111  BP: (!) 143/72 137/78  Pulse: 74 85  Resp:    Temp: 37.4 C 36.7 C  SpO2: 96% 98%    Last Pain:  Vitals:   11/22/18 1111  TempSrc: Oral  PainSc:                  Ricki Miller

## 2018-12-01 ENCOUNTER — Telehealth: Payer: Self-pay

## 2018-12-01 NOTE — Telephone Encounter (Signed)
Please see if patient can schedule visit to further discuss this and document concern.  Last two visits this has not been noted.  On March visit she had Cologuard ordered, but do not see where it was done.  Would benefit from visit, discussion, and then referral from there.  Thank you.  Visit can be virtual if preferred, since she recently had surgery.

## 2018-12-01 NOTE — Telephone Encounter (Signed)
Copied from [place] [id number]. Topic: Referral - Request for Referral >> Dec 01, 2018  9:50 AM Percell Belt A wrote: Has patient seen PCP for this complaint? Yes.   *If NO, is insurance requiring patient see PCP for this issue before PCP can refer them? Referral for which specialty: GI  Preferred provider/office: Dr Verl Blalock with [facility]  Reason for referral: Pt has Diarrhea Daily, this have been a on going issue for a long time and she would like to see a specialist about it Best number  [phone]   Routing to provider for referral.

## 2018-12-01 NOTE — Telephone Encounter (Signed)
Called and left VM for patient to please return my call.

## 2018-12-02 NOTE — Telephone Encounter (Signed)
Pt called in, scheduled appt.

## 2018-12-09 ENCOUNTER — Other Ambulatory Visit: Payer: Self-pay | Admitting: Nurse Practitioner

## 2018-12-09 DIAGNOSIS — I10 Essential (primary) hypertension: Secondary | ICD-10-CM

## 2018-12-15 ENCOUNTER — Ambulatory Visit (INDEPENDENT_AMBULATORY_CARE_PROVIDER_SITE_OTHER): Payer: BC Managed Care – PPO | Admitting: Nurse Practitioner

## 2018-12-15 ENCOUNTER — Other Ambulatory Visit: Payer: Self-pay

## 2018-12-15 ENCOUNTER — Encounter: Payer: Self-pay | Admitting: Nurse Practitioner

## 2018-12-15 VITALS — BP 122/75 | HR 75 | Temp 98.8°F

## 2018-12-15 DIAGNOSIS — K591 Functional diarrhea: Secondary | ICD-10-CM

## 2018-12-15 NOTE — Patient Instructions (Signed)

## 2018-12-15 NOTE — Assessment & Plan Note (Signed)
Chronic for over 7 years with recent normal LFT and TSH.  No h/o colonoscopy.  Obtain CBC, CMP, Thyroid labs today.  Referral placed to Dr. Allen Norris at [place] per patient request.  Suspect some IBS, have recommended daily probiotic tablet and monitoring diet for any trigger foods.  Return as scheduled.

## 2018-12-15 NOTE — Progress Notes (Signed)
BP 122/75   Pulse 75   Temp 98.8 F (37.1 C) (Oral)   LMP 12/24/2012 (Exact Date)   SpO2 96%    Subjective:    Patient ID: Jessica Brennan, female    DOB: 01-06-1966, 53 y.o.   MRN: [id number]  HPI: Jessica Brennan is a 53 y.o. female  Chief Complaint  Patient presents with  . Referral    pt requesting GI referral- states she has been having diarrhea, leaky gut or years now   DIARRHEA Has had diarrhea for over 7 years on/off.  Saw a Dr. Francisco Capuchin who provided her medication, which did not help (cholestyramine).  These symptoms started after gallbladder was removed, 2010.  Has had incidents of incontinence x 1, was driving and there was nothing she could do.  During a typical day has diarrhea about twice a day.  At times the diarrhea presents with abdominal pain and then other times without. Does endorse some tenesmus with these episodes.  Denies blood in stool or melena. Can not think of anything that makes episodes of diarrhea better or worse.    Has never had colonoscopy.  Did do Cologuard in summer, but unable to find results in chart.  With further assistance from [place] it was noted via company that patient sent in two samples with faults on them they could not run, she is to call them for third kit. Fever: no Nausea: no Vomiting: no Weight loss: no Decreased appetite: no Diarrhea: yes Constipation: no Blood in stool: no Heartburn: no Jaundice: no Rash: no Dysuria/urinary frequency: no Hematuria: no History of sexually transmitted disease: no Recurrent NSAID use: no  Relevant past medical, surgical, family and social history reviewed and updated as indicated. Interim medical history since our last visit reviewed. Allergies and medications reviewed and updated.  Review of Systems  Constitutional: Negative for activity change, appetite change, diaphoresis, fatigue and fever.  Respiratory: Negative for cough, chest tightness and shortness of breath.    Cardiovascular: Negative for chest pain, palpitations and leg swelling.  Gastrointestinal: Positive for diarrhea. Negative for abdominal distention, abdominal pain, constipation, nausea and vomiting.  Endocrine: Negative for cold intolerance, heat intolerance, polydipsia, polyphagia and polyuria.  Neurological: Negative for dizziness, syncope, weakness, light-headedness, numbness and headaches.  Psychiatric/Behavioral: Negative.     Per HPI unless specifically indicated above     Objective:    BP 122/75   Pulse 75   Temp 98.8 F (37.1 C) (Oral)   LMP 12/24/2012 (Exact Date)   SpO2 96%   Wt Readings from Last 3 Encounters:  11/20/18 234 lb 12.6 oz (106.5 kg)  11/13/18 235 lb (106.6 kg)  09/18/18 235 lb 14.3 oz (107 kg)    Physical Exam Vitals signs and nursing note reviewed.  Constitutional:      General: She is awake. She is not in acute distress.    Appearance: She is well-developed. She is obese. She is not ill-appearing.  HENT:     Head: Normocephalic.     Right Ear: Hearing normal.     Left Ear: Hearing normal.  Eyes:     General: Lids are normal.        Right eye: No discharge.        Left eye: No discharge.     Conjunctiva/sclera: Conjunctivae normal.     Pupils: Pupils are equal, round, and reactive to light.  Neck:     Musculoskeletal: Normal range of motion and neck supple.     Thyroid: No  thyromegaly.     Vascular: No carotid bruit.  Cardiovascular:     Rate and Rhythm: Normal rate and regular rhythm.     Heart sounds: Normal heart sounds. No murmur. No gallop.   Pulmonary:     Effort: Pulmonary effort is normal. No accessory muscle usage or respiratory distress.     Breath sounds: Normal breath sounds.  Abdominal:     General: Bowel sounds are normal.     Palpations: Abdomen is soft. There is no hepatomegaly or splenomegaly.     Tenderness: There is no abdominal tenderness.  Musculoskeletal:     Right lower leg: No edema.     Left lower leg: No edema.   Skin:    General: Skin is warm and dry.  Neurological:     Mental Status: She is alert and oriented to person, place, and time.  Psychiatric:        Attention and Perception: Attention normal.        Mood and Affect: Mood normal.        Behavior: Behavior normal. Behavior is cooperative.        Thought Content: Thought content normal.        Judgment: Judgment normal.     Results for orders placed or performed during the hospital encounter of 11/20/18  CBC  Result Value Ref Range   WBC 13.4 (H) 4.0 - 10.5 K/uL   RBC 4.52 3.87 - 5.11 MIL/uL   Hemoglobin 12.9 12.0 - 15.0 g/dL   HCT 39.7 36.0 - 46.0 %   MCV 87.8 80.0 - 100.0 fL   MCH 28.5 26.0 - 34.0 pg   MCHC 32.5 30.0 - 36.0 g/dL   RDW 14.5 11.5 - 15.5 %   Platelets 177 150 - 400 K/uL   nRBC 0.0 0.0 - 0.2 %  Basic metabolic panel  Result Value Ref Range   Sodium 134 (L) 135 - 145 mmol/L   Potassium 3.3 (L) 3.5 - 5.1 mmol/L   Chloride 102 98 - 111 mmol/L   CO2 24 22 - 32 mmol/L   Glucose, Bld 137 (H) 70 - 99 mg/dL   BUN 9 6 - 20 mg/dL   Creatinine, Ser 0.59 0.44 - 1.00 mg/dL   Calcium 8.5 (L) 8.9 - 10.3 mg/dL   GFR calc non Af Amer >60 >60 mL/min   GFR calc Af Amer >60 >60 mL/min   Anion gap 8 5 - 15  CBC  Result Value Ref Range   WBC 15.4 (H) 4.0 - 10.5 K/uL   RBC 4.40 3.87 - 5.11 MIL/uL   Hemoglobin 12.5 12.0 - 15.0 g/dL   HCT 38.4 36.0 - 46.0 %   MCV 87.3 80.0 - 100.0 fL   MCH 28.4 26.0 - 34.0 pg   MCHC 32.6 30.0 - 36.0 g/dL   RDW 14.4 11.5 - 15.5 %   Platelets 188 150 - 400 K/uL   nRBC 0.0 0.0 - 0.2 %      Assessment & Plan:   Problem List Items Addressed This Visit      Digestive   Functional diarrhea - Primary    Chronic for over 7 years with recent normal LFT and TSH.  No h/o colonoscopy.  Obtain CBC, CMP, Thyroid labs today.  Referral placed to Dr. Allen Norris at [place] per patient request.  Suspect some IBS, have recommended daily probiotic tablet and monitoring diet for any trigger foods.  Return as  scheduled.      Relevant  Orders   Ambulatory referral to Gastroenterology   Comp Met (CMET)   CBC with Differential/Platelet   Thyroid Panel With TSH       Follow up plan: Return if symptoms worsen or fail to improve.

## 2018-12-16 ENCOUNTER — Telehealth: Payer: Self-pay | Admitting: Nurse Practitioner

## 2018-12-16 LAB — CBC WITH DIFFERENTIAL/PLATELET
Basophils Absolute: 0 10*3/uL (ref 0.0–0.2)
Basos: 0 %
EOS (ABSOLUTE): 0.3 10*3/uL (ref 0.0–0.4)
Eos: 3 %
Hematocrit: 40.3 % (ref 34.0–46.6)
Hemoglobin: 13.7 g/dL (ref 11.1–15.9)
Immature Grans (Abs): 0 10*3/uL (ref 0.0–0.1)
Immature Granulocytes: 0 %
Lymphocytes Absolute: 2.9 10*3/uL (ref 0.7–3.1)
Lymphs: 29 %
MCH: 28.2 pg (ref 26.6–33.0)
MCHC: 34 g/dL (ref 31.5–35.7)
MCV: 83 fL (ref 79–97)
Monocytes Absolute: 0.6 10*3/uL (ref 0.1–0.9)
Monocytes: 7 %
Neutrophils Absolute: 6 10*3/uL (ref 1.4–7.0)
Neutrophils: 61 %
Platelets: 289 10*3/uL (ref 150–450)
RBC: 4.86 x10E6/uL (ref 3.77–5.28)
RDW: 13.9 % (ref 11.7–15.4)
WBC: 9.8 10*3/uL (ref 3.4–10.8)

## 2018-12-16 LAB — COMPREHENSIVE METABOLIC PANEL
ALT: 41 IU/L — ABNORMAL HIGH (ref 0–32)
AST: 31 IU/L (ref 0–40)
Albumin/Globulin Ratio: 1.4 (ref 1.2–2.2)
Albumin: 4.2 g/dL (ref 3.8–4.9)
Alkaline Phosphatase: 89 IU/L (ref 39–117)
BUN/Creatinine Ratio: 13 (ref 9–23)
BUN: 10 mg/dL (ref 6–24)
Bilirubin Total: 0.3 mg/dL (ref 0.0–1.2)
CO2: 22 mmol/L (ref 20–29)
Calcium: 9.6 mg/dL (ref 8.7–10.2)
Chloride: 102 mmol/L (ref 96–106)
Creatinine, Ser: 0.77 mg/dL (ref 0.57–1.00)
GFR calc Af Amer: 101 mL/min/{1.73_m2} (ref >59)
GFR calc non Af Amer: 87 mL/min/{1.73_m2} (ref >59)
Globulin, Total: 2.9 g/dL (ref 1.5–4.5)
Glucose: 132 mg/dL — ABNORMAL HIGH (ref 65–99)
Potassium: 4.2 mmol/L (ref 3.5–5.2)
Sodium: 140 mmol/L (ref 134–144)
Total Protein: 7.1 g/dL (ref 6.0–8.5)

## 2018-12-16 LAB — THYROID PANEL WITH TSH
Free Thyroxine Index: 2 (ref 1.2–4.9)
T3 Uptake Ratio: 26 % (ref 24–39)
T4, Total: 7.6 ug/dL (ref 4.5–12.0)
TSH: 3.09 u[IU]/mL (ref 0.450–4.500)

## 2018-12-16 NOTE — Telephone Encounter (Signed)
Copied from [place] [id number]. Topic: General - Other >> Dec 16, 2018  1:17 PM Yvette Rack wrote: Reason for CRM: Pt stated she had a missed call from the office and a message asking her to return the call. Pt requests call back.

## 2019-01-26 ENCOUNTER — Ambulatory Visit: Payer: BC Managed Care – PPO | Admitting: Gastroenterology

## 2019-02-14 ENCOUNTER — Other Ambulatory Visit: Payer: Self-pay | Admitting: Nurse Practitioner

## 2019-02-14 DIAGNOSIS — E039 Hypothyroidism, unspecified: Secondary | ICD-10-CM

## 2019-04-07 LAB — COLOGUARD
COLOGUARD: NEGATIVE
Cologuard: NEGATIVE

## 2019-04-08 ENCOUNTER — Ambulatory Visit: Payer: BC Managed Care – PPO | Admitting: Gastroenterology

## 2019-04-25 ENCOUNTER — Ambulatory Visit: Payer: BC Managed Care – PPO | Attending: Internal Medicine

## 2019-04-25 DIAGNOSIS — Z23 Encounter for immunization: Secondary | ICD-10-CM

## 2019-04-25 NOTE — Progress Notes (Signed)
   Covid-19 Vaccination Clinic  Name:  Jessica Brennan    MRN: [id number] DOB: Dec 16, 1965  04/25/2019  Jessica Brennan was observed post Covid-19 immunization for 15 minutes without incidence. She was provided with Vaccine Information Sheet and instruction to access the V-Safe system.   Jessica Brennan was instructed to call 911 with any severe reactions post vaccine: Marland Kitchen Difficulty breathing  . Swelling of your face and throat  . A fast heartbeat  . A bad rash all over your body  . Dizziness and weakness    Immunizations Administered    Name Date Dose VIS Date Route   Moderna COVID-19 Vaccine 04/25/2019  4:35 PM 0.5 mL 01/27/2019 Intramuscular   Manufacturer: Moderna   Lot: [id number]   NDC: 25672-091-98

## 2019-05-11 ENCOUNTER — Ambulatory Visit: Payer: BC Managed Care – PPO | Admitting: Gastroenterology

## 2019-05-11 ENCOUNTER — Encounter: Payer: Self-pay | Admitting: *Deleted

## 2019-05-11 DIAGNOSIS — K591 Functional diarrhea: Secondary | ICD-10-CM

## 2019-05-11 NOTE — Progress Notes (Deleted)
Gastroenterology Consultation  Referring Provider:     Venita Lick, NP Primary Care Physician:  Venita Lick, NP Primary Gastroenterologist:  Dr. Allen Norris     Reason for Consultation:     Functional diarrhea        HPI:   Jessica Brennan is a 54 y.o. y/o female referred for consultation & management of functional diarrhea by Dr. Venita Lick, NP.  This patient comes to see me today after being referred to my office back in October 2020.  The patient had seen her primary care provider at that time and had reported that her diarrhea has been present for at least 7 years and was recommended to see me.  Past Medical History:  Diagnosis Date  . Anxiety   . Asthma   . Diffuse cystic mastopathy   . Dysthymic disorder   . Hypertension   . Hypothyroidism   . Menopausal state   . Obesity   . Vitamin D deficiency     Past Surgical History:  Procedure Laterality Date  . ABDOMINAL HYSTERECTOMY    . ANTERIOR CRUCIATE LIGAMENT REPAIR Left 2002   needs to have screw removed during tkr  . BREAST BIOPSY Left    1979 negative  . CHOLECYSTECTOMY    . DG LEFT TIBIA AND FIBULA ([place] [facility]) Left   . HARDWARE REMOVAL Left 11/20/2018   Procedure: HARDWARE REMOVAL;  Surgeon: Thornton Park, MD;  Location: [facility] OR[facility];  Service: Orthopedics;  Laterality: Left;  screw removal  . SEPTOPLASTY  2010  . TOTAL KNEE ARTHROPLASTY Right 09/18/2018   Procedure: TOTAL KNEE ARTHROPLASTY;  Surgeon: Thornton Park, MD;  Location: [facility];  Service: Orthopedics;  Laterality: Right;  . TOTAL KNEE ARTHROPLASTY Left 11/20/2018   Procedure: TOTAL KNEE ARTHROPLASTY;  Surgeon: Thornton Park, MD;  Location: [facility];  Service: Orthopedics;  Laterality: Left;    Prior to Admission medications   Medication Sig Start Date End Date Taking? Authorizing Provider  acetaminophen (TYLENOL) 325 MG tablet Take 650 mg by mouth every 6 (six) hours as needed.    [provider]  albuterol (PROVENTIL  HFA;VENTOLIN HFA) 108 (90 Base) MCG/ACT inhaler Inhale 2 puffs into the lungs every 6 (six) hours as needed for wheezing or shortness of breath. 05/14/18   Cannady, Jolene T, NP  amLODipine (NORVASC) 5 MG tablet TAKE 1 TABLET BY MOUTH EVERY DAY 12/09/18   Cannady, Jolene T, NP  atorvastatin (LIPITOR) 20 MG tablet Take 1 tablet (20 mg total) by mouth daily. Patient taking differently: Take 20 mg by mouth daily at 6 PM.  05/15/18   Cannady, Jolene T, NP  enoxaparin (LOVENOX) 40 MG/0.4ML injection Inject 0.4 mLs (40 mg total) into the skin daily. 11/22/18 12/22/18  Thornton Park, MD  levothyroxine (SYNTHROID) 75 MCG tablet TAKE 1 TABLET (75 MCG TOTAL) BY MOUTH DAILY BEFORE BREAKFAST. 02/14/19   Cannady, Henrine Screws T, NP  loratadine (CLARITIN) 10 MG tablet Take 10 mg by mouth daily as needed for allergies.    [provider]  methocarbamol (ROBAXIN) 500 MG tablet Take 1 tablet (500 mg total) by mouth every 6 (six) hours as needed for muscle spasms. Patient not taking: Reported on 11/13/2018 09/20/18   Carlynn Spry, PA-C  oxyCODONE (OXY IR/ROXICODONE) 5 MG immediate release tablet Take 1-2 tablets (5-10 mg total) by mouth every 4 (four) hours as needed for moderate pain (pain score 4-6). Patient not taking: Reported on 12/15/2018 11/22/18   Thornton Park, MD  sertraline (ZOLOFT)  100 MG tablet Take 150 mg by mouth daily.  07/16/18   [provider]    Family History  Problem Relation Age of Onset  . Cancer Mother        lung  . Heart disease Father   . Hyperlipidemia Sister   . Arthritis Sister   . Hyperlipidemia Brother   . Arthritis Brother   . Cancer Paternal Grandmother        uterine  . Stroke Paternal Grandfather   . Arthritis Brother   . Hyperlipidemia Brother   . Arthritis Brother   . Arthritis Sister      Social History   Tobacco Use  . Smoking status: Never Smoker  . Smokeless tobacco: Never Used  Substance Use Topics  . Alcohol use: Yes    Alcohol/week: 6.0  standard drinks    Types: 6 Cans of beer per week    Comment: on occasion  . Drug use: No    Allergies as of 05/11/2019 - Review Complete 12/15/2018  Allergen Reaction Noted  . Codeine sulfate Hives and Other (See Comments) 10/07/2014  . Other Other (See Comments) 10/07/2014  . Pollen extract Other (See Comments) 10/07/2014    Review of Systems:    All systems reviewed and negative except where noted in HPI.   Physical Exam:  LMP 12/24/2012 (Exact Date)  Patient's last menstrual period was 12/24/2012 (exact date). General:   Alert,  Well-developed, well-nourished, pleasant and cooperative in NAD Head:  Normocephalic and atraumatic. Eyes:  Sclera clear, no icterus.   Conjunctiva pink. Ears:  Normal auditory acuity. Neck:  Supple; no masses or thyromegaly. Lungs:  Respirations even and unlabored.  Clear throughout to auscultation.   No wheezes, crackles, or rhonchi. No acute distress. Heart:  Regular rate and rhythm; no murmurs, clicks, rubs, or gallops. Abdomen:  Normal bowel sounds.  No bruits.  Soft, non-tender and non-distended without masses, hepatosplenomegaly or hernias noted.  No guarding or rebound tenderness.  Negative Carnett sign.   Rectal:  Deferred.  Pulses:  Normal pulses noted. Extremities:  No clubbing or edema.  No cyanosis. Neurologic:  Alert and oriented x3;  grossly normal neurologically. Skin:  Intact without significant lesions or rashes.  No jaundice. Lymph Nodes:  No significant cervical adenopathy. Psych:  Alert and cooperative. Normal mood and affect.  Imaging Studies: No results found.  Assessment and Plan:   Jessica Brennan is a 54 y.o. y/o female ***    Midge Minium, MD. Clementeen Graham    Note: This dictation was prepared with Dragon dictation along with smaller phrase technology. Any transcriptional errors that result from this process are unintentional.

## 2019-05-14 ENCOUNTER — Other Ambulatory Visit: Payer: Self-pay | Admitting: Nurse Practitioner

## 2019-05-23 ENCOUNTER — Ambulatory Visit: Payer: BC Managed Care – PPO | Attending: Internal Medicine

## 2019-05-23 DIAGNOSIS — Z23 Encounter for immunization: Secondary | ICD-10-CM

## 2019-05-23 NOTE — Progress Notes (Signed)
   Covid-19 Vaccination Clinic  Name:  Jessica Brennan    MRN: [id number] DOB: 1965-08-11  05/23/2019  [name] was observed post Covid-19 immunization for 15 minutes without incident. She was provided with Vaccine Information Sheet and instruction to access the V-Safe system.   [name] was instructed to call 911 with any severe reactions post vaccine: Marland Kitchen Difficulty breathing  . Swelling of face and throat  . A fast heartbeat  . A bad rash all over body  . Dizziness and weakness   Immunizations Administered    Name Date Dose VIS Date Route   Moderna COVID-19 Vaccine 05/23/2019  1:47 PM 0.5 mL 01/27/2019 Intramuscular   Manufacturer: Gala Murdoch   Lot: [id number]   NDC: 64353-912-25

## 2019-06-12 ENCOUNTER — Other Ambulatory Visit: Payer: Self-pay | Admitting: Nurse Practitioner

## 2019-06-12 DIAGNOSIS — E039 Hypothyroidism, unspecified: Secondary | ICD-10-CM

## 2019-07-13 ENCOUNTER — Other Ambulatory Visit: Payer: Self-pay | Admitting: Nurse Practitioner

## 2019-07-13 DIAGNOSIS — I10 Essential (primary) hypertension: Secondary | ICD-10-CM

## 2019-07-13 NOTE — Telephone Encounter (Signed)
Courtesy refill  

## 2019-08-13 ENCOUNTER — Other Ambulatory Visit: Payer: Self-pay

## 2019-08-13 DIAGNOSIS — I10 Essential (primary) hypertension: Secondary | ICD-10-CM

## 2019-08-13 MED ORDER — AMLODIPINE BESYLATE 5 MG PO TABS: 5.0000 mg | ORAL_TABLET | Freq: Every day | ORAL | 0 refills | Status: DC

## 2019-08-13 NOTE — Telephone Encounter (Signed)
Called and left a message asking patient to call and schedule a follow up appt.

## 2019-09-10 ENCOUNTER — Other Ambulatory Visit: Payer: Self-pay | Admitting: Nurse Practitioner

## 2019-09-10 DIAGNOSIS — E039 Hypothyroidism, unspecified: Secondary | ICD-10-CM

## 2019-10-08 ENCOUNTER — Other Ambulatory Visit: Payer: Self-pay | Admitting: Nurse Practitioner

## 2019-10-08 DIAGNOSIS — I10 Essential (primary) hypertension: Secondary | ICD-10-CM

## 2019-10-08 NOTE — Telephone Encounter (Signed)
Requested medication (s) are due for refill today: yes  Requested medication (s) are on the active medication list: yes  Last refill:  08/13/19 #60 0 refills  Future visit scheduled: no  Notes to clinic:  overdue OV >3 months, called and left message to set up appt.      Requested Prescriptions  Pending Prescriptions Disp Refills   amLODipine (NORVASC) 5 MG tablet [Pharmacy Med Name: AMLODIPINE BESYLATE 5 MG TAB] 60 tablet 0    Sig: TAKE 1 TABLET BY MOUTH EVERY DAY      Cardiovascular:  Calcium Channel Blockers Failed - 10/08/2019  2:31 PM      Failed - Valid encounter within last 6 months    Recent Outpatient Visits           9 months ago Functional diarrhea   [facility] [place], Corrie Dandy T, NP   1 year ago Preoperative clearance   [facility] [place], [place] T, NP   1 year ago Current severe episode of major depressive disorder without psychotic features without prior episode (HCC)   [facility] [place], Jolene T, NP   1 year ago Current severe episode of major depressive disorder without psychotic features without prior episode (HCC)   [facility] Trey Sailors, PA-C   3 years ago Essential hypertension   [facility] Gabriel Cirri, NP              Passed - Last BP in normal range    BP Readings from Last 1 Encounters:  12/15/18 122/75

## 2019-10-09 NOTE — Telephone Encounter (Signed)
LVM TO MAKE APT FOR FUTURE REFILLS.

## 2019-10-26 ENCOUNTER — Ambulatory Visit: Payer: BC Managed Care – PPO | Admitting: Nurse Practitioner

## 2019-10-26 ENCOUNTER — Ambulatory Visit
Admission: RE | Admit: 2019-10-26 | Discharge: 2019-10-26 | Disposition: A | Discharge status: Home / Self Care | Payer: BC Managed Care – PPO | Attending: Nurse Practitioner | Admitting: Nurse Practitioner

## 2019-10-26 ENCOUNTER — Other Ambulatory Visit: Payer: Self-pay

## 2019-10-26 ENCOUNTER — Ambulatory Visit
Admission: RE | Admit: 2019-10-26 | Discharge: 2019-10-26 | Disposition: A | Discharge status: Home / Self Care | Payer: BC Managed Care – PPO | Source: Ambulatory Visit | Attending: Nurse Practitioner | Admitting: Nurse Practitioner

## 2019-10-26 ENCOUNTER — Encounter: Payer: Self-pay | Admitting: Nurse Practitioner

## 2019-10-26 VITALS — BP 127/75 | HR 71 | Temp 98.2°F | Ht 62.5 in | Wt 250.6 lb

## 2019-10-26 DIAGNOSIS — Z9289 Personal history of other medical treatment: Secondary | ICD-10-CM | POA: Insufficient documentation

## 2019-10-26 DIAGNOSIS — Z23 Encounter for immunization: Secondary | ICD-10-CM

## 2019-10-26 DIAGNOSIS — E559 Vitamin D deficiency, unspecified: Secondary | ICD-10-CM

## 2019-10-26 DIAGNOSIS — E782 Mixed hyperlipidemia: Secondary | ICD-10-CM | POA: Diagnosis not present

## 2019-10-26 DIAGNOSIS — E039 Hypothyroidism, unspecified: Secondary | ICD-10-CM

## 2019-10-26 DIAGNOSIS — F322 Major depressive disorder, single episode, severe without psychotic features: Secondary | ICD-10-CM | POA: Diagnosis not present

## 2019-10-26 DIAGNOSIS — I10 Essential (primary) hypertension: Secondary | ICD-10-CM | POA: Diagnosis not present

## 2019-10-26 DIAGNOSIS — R7301 Impaired fasting glucose: Secondary | ICD-10-CM

## 2019-10-26 LAB — MICROALBUMIN, URINE WAIVED
Creatinine, Urine Waived: 200 mg/dL (ref 10–300)
Microalb, Ur Waived: 30 mg/L — ABNORMAL HIGH (ref 0–19)
Microalb/Creat Ratio: <30 mg/g (ref <30)

## 2019-10-26 LAB — BAYER DCA HB A1C WAIVED: HB A1C (BAYER DCA - WAIVED): 5.4 % (ref <7.0)

## 2019-10-26 MED ORDER — LEVOTHYROXINE SODIUM 75 MCG PO TABS: 75.0000 ug | ORAL_TABLET | Freq: Every day | ORAL | 4 refills | Status: DC

## 2019-10-26 MED ORDER — AMLODIPINE BESYLATE 5 MG PO TABS: 5.0000 mg | ORAL_TABLET | Freq: Every day | ORAL | 4 refills | Status: DC

## 2019-10-26 MED ORDER — ATORVASTATIN CALCIUM 20 MG PO TABS
20.0000 mg | ORAL_TABLET | Freq: Every day | ORAL | 4 refills | Status: DC
Start: 2019-10-26 — End: 2021-06-05

## 2019-10-26 NOTE — Patient Instructions (Signed)
Hypothyroidism  Hypothyroidism is when the thyroid gland does not make enough of certain hormones (it is underactive). The thyroid gland is a small gland located in the lower front part of the neck, just in front of the windpipe (trachea). This gland makes hormones that help control how the body uses food for energy (metabolism) as well as how the heart and brain function. These hormones also play a role in keeping your bones strong. When the thyroid is underactive, it produces too little of the hormones thyroxine (T4) and triiodothyronine (T3). What are the causes? This condition may be caused by:  Hashimoto's disease. This is a disease in which the body's disease-fighting system (immune system) attacks the thyroid gland. This is the most common cause.  Viral infections.  Pregnancy.  Certain medicines.  Birth defects.  Past radiation treatments to the head or neck for cancer.  Past treatment with radioactive iodine.  Past exposure to radiation in the environment.  Past surgical removal of part or all of the thyroid.  Problems with a gland in the center of the brain (pituitary gland).  Lack of enough iodine in the diet. What increases the risk? You are more likely to develop this condition if:  You are female.  You have a family history of thyroid conditions.  You use a medicine called lithium.  You take medicines that affect the immune system (immunosuppressants). What are the signs or symptoms? Symptoms of this condition include:  Feeling as though you have no energy (lethargy).  Not being able to tolerate cold.  Weight gain that is not explained by a change in diet or exercise habits.  Lack of appetite.  Dry skin.  Coarse hair.  Menstrual irregularity.  Slowing of thought processes.  Constipation.  Sadness or depression. How is this diagnosed? This condition may be diagnosed based on:  Your symptoms, your medical history, and a physical exam.  Blood  tests. You may also have imaging tests, such as an ultrasound or MRI. How is this treated? This condition is treated with medicine that replaces the thyroid hormones that your body does not make. After you begin treatment, it may take several weeks for symptoms to go away. Follow these instructions at home:  Take over-the-counter and prescription medicines only as told by your health care provider.  If you start taking any new medicines, tell your health care provider.  Keep all follow-up visits as told by your health care provider. This is important. ? As your condition improves, your dosage of thyroid hormone medicine may change. ? You will need to have blood tests regularly so that your health care provider can monitor your condition. Contact a health care provider if:  Your symptoms do not get better with treatment.  You are taking thyroid replacement medicine and you: ? Sweat a lot. ? Have tremors. ? Feel anxious. ? Lose weight rapidly. ? Cannot tolerate heat. ? Have emotional swings. ? Have diarrhea. ? Feel weak. Get help right away if you have:  Chest pain.  An irregular heartbeat.  A rapid heartbeat.  Difficulty breathing. Summary  Hypothyroidism is when the thyroid gland does not make enough of certain hormones (it is underactive).  When the thyroid is underactive, it produces too little of the hormones thyroxine (T4) and triiodothyronine (T3).  The most common cause is Hashimoto's disease, a disease in which the body's disease-fighting system (immune system) attacks the thyroid gland. The condition can also be caused by viral infections, medicine, pregnancy, or past   radiation treatment to the head or neck.  Symptoms may include weight gain, dry skin, constipation, feeling as though you do not have energy, and not being able to tolerate cold.  This condition is treated with medicine to replace the thyroid hormones that your body does not make. This information  is not intended to replace advice given to you by your health care provider. Make sure you discuss any questions you have with your health care provider. Document Revised: 01/25/2017 Document Reviewed: 01/23/2017 Elsevier Patient Education  2020 Elsevier Inc.  

## 2019-10-26 NOTE — Assessment & Plan Note (Signed)
Chronic, ongoing.  Continue current medication regimen.  Check TSH today and adjust dose as needed.

## 2019-10-26 NOTE — Assessment & Plan Note (Addendum)
BMI 45.10 with HTN.  Recommended eating smaller high protein, low fat meals more frequently and exercising 30 mins a day 5 times a week with a goal of 10-15lb weight loss in the next 3 months. Patient voiced their understanding and motivation to adhere to these recommendations.

## 2019-10-26 NOTE — Assessment & Plan Note (Signed)
Chronic, ongoing.  Continue current medication regimen and adjust as needed. Lipid panel today. 

## 2019-10-26 NOTE — Progress Notes (Signed)
BP 127/75   Pulse 71   Temp 98.2 F (36.8 C) (Oral)   Ht 5' 2.5" (1.588 m)   Wt 250 lb 9.6 oz (113.7 kg)   LMP 12/24/2012 (Exact Date)   SpO2 96%   BMI 45.10 kg/m    Subjective:    Patient ID: Jessica Brennan, female    DOB: 01-29-1966, 54 y.o.   MRN: [id number]  HPI: Jessica Brennan is a 54 y.o. female  Chief Complaint  Patient presents with  . Hyperlipidemia  . Hypertension  . 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    HYPERTENSION / HYPERLIPIDEMIA Currently taking Norvasc and Lipitor -- just started back on this 3 days ago.  Has history of elevated glucose, last A1C July 2020 was 5.6%.  She also has Vitamin D deficiency with no recent level on chart.  No recent falls or fractures. Satisfied with current treatment? yes Duration of hypertension: chronic BP monitoring frequency: not checking BP range:  BP medication side effects: no Duration of hyperlipidemia: chronic Cholesterol medication side effects: no Cholesterol supplements: none Medication compliance: good compliance Aspirin: no Recent stressors: no Recurrent headaches: no Visual changes: no Palpitations: no Dyspnea: no Chest pain: no Lower extremity edema: yes, at baseline Dizzy/lightheaded: no   HYPOTHYROIDISM Currently on Levothyroxine 75 MCG daily.  Last TSH 3.090 in October. Thyroid control status:stable Satisfied with current treatment? yes Medication side effects: no Medication compliance: good compliance Etiology of hypothyroidism: unknown Recent dose adjustment:no Fatigue: yes, reports at baseline due to mood Cold intolerance: no Heat intolerance: no Weight gain: no Weight loss: no Constipation: no Diarrhea/loose stools: yes, occasional with coffee intake Palpitations: no Lower extremity edema: yes, with arthritis Anxiety/depressed mood: yes , her  baseline  DEPRESSION Is going to psychiatry, Dr. Lutricia Feil.  Reports long standing struggle with depression/anxiety + her profession as a Pension scheme manager.  Currently taking Zoloft 200 MG daily. Mood status: exacerbated Satisfied with current treatment?: no Symptom severity: moderate  Duration of current treatment : chronic Side effects: no Medication compliance: good compliance Psychotherapy/counseling: yes in the past, not at present due to cost Previous psychiatric medications: effexor Depressed mood: yes Anxious mood: yes Anhedonia: no Significant weight loss or gain: no Insomnia: none Fatigue: yes Feelings of worthlessness or guilt: yes Impaired concentration/indecisiveness: yes Suicidal ideations: none Hopelessness: yes Crying spells: yes Depression screen [facility] 2/9 10/26/2019 05/14/2018 10/17/2017 03/30/2016 10/08/2014  Decreased Interest 2 3 3 3 3   Down, Depressed, Hopeless 1 2 2 3 2   PHQ - 2 Score 3 5 5 6 5   Altered sleeping 0 0 1 3 1   Tired, decreased energy 1 3 3 3 3   Change in appetite 1 2 0 3 3  Feeling bad or failure about yourself  2 3 1 3 3   Trouble concentrating 0 3 0 1 0  Moving slowly or fidgety/restless 0 1 0 0 0  Suicidal thoughts 0 3 0 0 0  PHQ-9 Score 7 20 10 19 15   Difficult doing work/chores Not difficult at all Very difficult - - Somewhat difficult    Relevant past medical, surgical, family and social history reviewed and updated as indicated. Interim medical history since our last visit reviewed. Allergies and medications reviewed and updated.  Review of Systems  Constitutional: Negative for activity change, appetite change, diaphoresis, fatigue and fever.  Respiratory: Negative for cough,  chest tightness and shortness of breath.   Cardiovascular: Negative for chest pain, palpitations and leg swelling.  Gastrointestinal: Negative.   Endocrine: Negative for cold intolerance, heat intolerance, polydipsia, polyphagia and polyuria.  Neurological:  Negative.   Psychiatric/Behavioral: Negative.     Per HPI unless specifically indicated above     Objective:    BP 127/75   Pulse 71   Temp 98.2 F (36.8 C) (Oral)   Ht 5' 2.5" (1.588 m)   Wt 250 lb 9.6 oz (113.7 kg)   LMP 12/24/2012 (Exact Date)   SpO2 96%   BMI 45.10 kg/m   Wt Readings from Last 3 Encounters:  10/26/19 250 lb 9.6 oz (113.7 kg)  11/20/18 234 lb 12.6 oz (106.5 kg)  11/13/18 235 lb (106.6 kg)    Physical Exam Vitals and nursing note reviewed.  Constitutional:      General: She is awake. She is not in acute distress.    Appearance: She is well-developed. She is obese. She is not ill-appearing.  HENT:     Head: Normocephalic.     Right Ear: Hearing normal.     Left Ear: Hearing normal.     Nose: Nose normal.     Mouth/Throat:     Mouth: Mucous membranes are moist.  Eyes:     General: Lids are normal.        Right eye: No discharge.        Left eye: No discharge.     Conjunctiva/sclera: Conjunctivae normal.     Pupils: Pupils are equal, round, and reactive to light.  Neck:     Thyroid: No thyromegaly.     Vascular: No carotid bruit or JVD.  Cardiovascular:     Rate and Rhythm: Normal rate and regular rhythm.     Heart sounds: Normal heart sounds. No murmur heard.  No gallop.   Pulmonary:     Effort: Pulmonary effort is normal. No accessory muscle usage or respiratory distress.     Breath sounds: Normal breath sounds.  Abdominal:     General: Bowel sounds are normal.     Palpations: Abdomen is soft. There is no hepatomegaly or splenomegaly.  Musculoskeletal:     Cervical back: Normal range of motion and neck supple.     Right lower leg: No edema.     Left lower leg: No edema.  Lymphadenopathy:     Cervical: No cervical adenopathy.  Skin:    General: Skin is warm and dry.  Neurological:     Mental Status: She is alert and oriented to person, place, and time.  Psychiatric:        Attention and Perception: Attention normal.        Mood and  Affect: Mood normal.        Behavior: Behavior normal. Behavior is cooperative.        Thought Content: Thought content normal.        Judgment: Judgment normal.     Results for orders placed or performed in visit on 04/08/19  Cologuard  Result Value Ref Range   Cologuard Negative Negative      Assessment & Plan:   Problem List Items Addressed This Visit      Cardiovascular and Mediastinum   Hypertension    Chronic, ongoing.  Continue current medication regimen and adjust as needed.  BP at goal today.  Recommend she monitor BP at few days a week at home and focus on DASH diet. BMP and urine ALB  today.  Refills sent.  Return in one year.      Relevant Medications   amLODipine (NORVASC) 5 MG tablet   atorvastatin (LIPITOR) 20 MG tablet   Other Relevant Orders   Basic metabolic panel   Microalbumin, Urine Waived     Endocrine   Hypothyroidism    Chronic, ongoing.  Continue current medication regimen.  Check TSH today and adjust dose as needed.      Relevant Medications   levothyroxine (SYNTHROID) 75 MCG tablet   Other Relevant Orders   TSH   T4, free     Other   Depression - Primary    Chronic, ongoing.  Long standing.  Continue current Zoloft dose at this time.  Is scheduled to see Dr. Lutricia Feil every 3 months.  She denies SI/HI at this time and reports she is "safe" + has plan in place if such thoughts present.        Relevant Medications   sertraline (ZOLOFT) 100 MG tablet   Vitamin D deficiency    Chronic, ongoing.  Continue daily supplement and recheck level today, adjust as needed.      Relevant Orders   VITAMIN D 25 Hydroxy (Vit-D Deficiency, Fractures)   Morbid obesity (HCC)    BMI 45.10 with HTN.  Recommended eating smaller high protein, low fat meals more frequently and exercising 30 mins a day 5 times a week with a goal of 10-15lb weight loss in the next 3 months. Patient voiced their understanding and motivation to adhere to these recommendations.        Hyperlipidemia    Chronic, ongoing.  Continue current medication regimen and adjust as needed.  Lipid panel today.      Relevant Medications   amLODipine (NORVASC) 5 MG tablet   atorvastatin (LIPITOR) 20 MG tablet   Other Relevant Orders   Lipid Panel w/o Chol/HDL Ratio    Other Visit Diagnoses    IFG (impaired fasting glucose)       A1C 5.4% today, continues to be stable without need for medications.   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   Relevant Orders   DG Chest 2 View   Need for influenza vaccination       Relevant Orders   Flu Vaccine QUAD 36+ mos IM (Completed)   Need for Td vaccine           Follow up plan: Return in about 1 year (around 10/25/2020) for Hypothyroid, HTN/HLD.

## 2019-10-26 NOTE — Assessment & Plan Note (Signed)
Chronic, ongoing.  Continue daily supplement and recheck level today, adjust as needed.

## 2019-10-26 NOTE — Assessment & Plan Note (Signed)
Chronic, ongoing.  Continue current medication regimen and adjust as needed.  BP at goal today.  Recommend she monitor BP at few days a week at home and focus on DASH diet. BMP and urine ALB today.  Refills sent.  Return in one year.

## 2019-10-26 NOTE — Assessment & Plan Note (Addendum)
Chronic, ongoing.  Long standing.  Continue current Zoloft dose at this time.  Is scheduled to see Dr. Lutricia Feil every 3 months.  She denies SI/HI at this time and reports she is "safe" + has plan in place if such thoughts present.

## 2019-10-27 LAB — BASIC METABOLIC PANEL
BUN/Creatinine Ratio: 16 (ref 9–23)
BUN: 12 mg/dL (ref 6–24)
CO2: 24 mmol/L (ref 20–29)
Calcium: 8.7 mg/dL (ref 8.7–10.2)
Chloride: 102 mmol/L (ref 96–106)
Creatinine, Ser: 0.73 mg/dL (ref 0.57–1.00)
GFR calc Af Amer: 106 mL/min/{1.73_m2} (ref >59)
GFR calc non Af Amer: 92 mL/min/{1.73_m2} (ref >59)
Glucose: 88 mg/dL (ref 65–99)
Potassium: 4.5 mmol/L (ref 3.5–5.2)
Sodium: 140 mmol/L (ref 134–144)

## 2019-10-27 LAB — LIPID PANEL W/O CHOL/HDL RATIO
Cholesterol, Total: 264 mg/dL — ABNORMAL HIGH (ref 100–199)
HDL: 111 mg/dL (ref >39)
LDL Chol Calc (NIH): 143 mg/dL — ABNORMAL HIGH (ref 0–99)
Triglycerides: 62 mg/dL (ref 0–149)
VLDL Cholesterol Cal: 10 mg/dL (ref 5–40)

## 2019-10-27 LAB — VITAMIN D 25 HYDROXY (VIT D DEFICIENCY, FRACTURES): Vit D, 25-Hydroxy: 33.9 ng/mL (ref 30.0–100.0)

## 2019-10-27 LAB — TSH: TSH: 3.66 u[IU]/mL (ref 0.450–4.500)

## 2019-10-27 LAB — T4, FREE: Free T4: 1 ng/dL (ref 0.82–1.77)

## 2019-10-27 NOTE — Progress Notes (Signed)
Please let Jessica Brennan know her labs have returned and overall look great with exception of cholesterol levels, which were elevated as expected.  I know she just restarted her Atorvastatin, I recommend she take this daily and we will recheck labs next visit.  Also focus on diet changes and regular activity.  If any questions let me know.  Have a great day!!

## 2020-01-11 ENCOUNTER — Telehealth: Payer: Self-pay

## 2020-01-11 NOTE — Telephone Encounter (Signed)
It looks like she is scheduled for 12/10, she needs something sooner, correct?

## 2020-01-11 NOTE — Telephone Encounter (Signed)
Called pt scheduled for Friday with Shanda Bumps due to needing paperwork filled out sooner

## 2020-01-11 NOTE — Telephone Encounter (Signed)
Apt was scheduled for 02/01/20. Patient states that she really needs something sooner with Jolene so the forms could be completed sooner. Please advise CB- [phone]

## 2020-01-11 NOTE — Telephone Encounter (Signed)
Called pt to schedule no answer left vm  

## 2020-01-11 NOTE — Telephone Encounter (Signed)
Received paperwork to be completed for the patient. Per Corrie Dandy, patient needs an in office and appointment to complete. Please call and schedule appointment.   Forms placed in the incomplete bin until appointment.

## 2020-01-11 NOTE — Telephone Encounter (Signed)
Noted  

## 2020-01-15 ENCOUNTER — Other Ambulatory Visit: Payer: Self-pay

## 2020-01-15 ENCOUNTER — Ambulatory Visit (INDEPENDENT_AMBULATORY_CARE_PROVIDER_SITE_OTHER): Payer: BC Managed Care – PPO | Admitting: Nurse Practitioner

## 2020-01-15 ENCOUNTER — Encounter: Payer: Self-pay | Admitting: Nurse Practitioner

## 2020-01-15 VITALS — BP 136/74 | HR 72 | Temp 98.1°F | Wt 247.6 lb

## 2020-01-15 DIAGNOSIS — Z008 Encounter for other general examination: Secondary | ICD-10-CM | POA: Diagnosis not present

## 2020-01-15 DIAGNOSIS — F322 Major depressive disorder, single episode, severe without psychotic features: Secondary | ICD-10-CM | POA: Diagnosis not present

## 2020-01-15 NOTE — Progress Notes (Signed)
BP 136/74   Pulse 72   Temp 98.1 F (36.7 C) (Oral)   Wt 247 lb 9.6 oz (112.3 kg)   LMP 12/24/2012 (Exact Date)   SpO2 97%   BMI 44.56 kg/m    Subjective:    Patient ID: Jessica Brennan, female    DOB: 17-Jul-1965, 54 y.o.   MRN: [id number]  HPI: Jessica Brennan is a 54 y.o. female presenting to have paperwork filled out.  Chief Complaint  Patient presents with  . 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   Reports taking sertraline 200 mg daily with good control of her mood.  Does not report any emotional, physical, or mental concerns as to why she would not be a lower potassium.  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  Allergies  Allergen Reactions  . Codeine Sulfate Hives and Other (See Comments)    Stomachache, GI upset, welts  . Other Other (See Comments)    Mold- Sinus infections  . Pollen Extract Other (See Comments)    sneezing   Outpatient Encounter Medications as of 01/15/2020  Medication Sig  . albuterol (PROVENTIL HFA;VENTOLIN HFA) 108 (90 Base) MCG/ACT inhaler Inhale 2 puffs into the lungs every 6 (six) hours as needed for wheezing or shortness of breath.  Marland Kitchen amLODipine (NORVASC) 5 MG tablet Take 1 tablet (5 mg total) by mouth daily. Need follow-up for further refills  . atorvastatin (LIPITOR) 20 MG tablet Take 1 tablet (20 mg total) by mouth daily.  . cholecalciferol (VITAMIN D3) 25 MCG (1000 UNIT) tablet Take 1,000 Units by mouth daily.  Marland Kitchen levothyroxine (SYNTHROID) 75 MCG tablet Take 1 tablet (75 mcg total) by mouth daily before breakfast.  . sertraline (ZOLOFT) 100 MG tablet Take 200 mg by mouth daily.    No facility-administered encounter medications on file as of 01/15/2020.   Patient Active Problem List   Diagnosis Date Noted  . S/P TKR (total knee replacement) using cement, left 11/20/2018  . S/P TKR (total knee replacement) using cement, right 09/18/2018  . Hypertension 03/30/2016  . Hypothyroidism 01/12/2015  . Depression 10/07/2014  . Vitamin D deficiency 10/07/2014  . Morbid obesity (HCC) 10/07/2014  . Sleep apnea 10/07/2014  . Hyperlipidemia 10/07/2014   Past Medical History:  Diagnosis Date  . Anxiety   . Asthma   . Diffuse cystic mastopathy   . Dysthymic disorder   . Hypertension   . Hypothyroidism   . Menopausal state   . Obesity   . Vitamin D deficiency    Relevant past medical, surgical, family and social history reviewed and updated as indicated. Interim medical history since our last visit reviewed.  Review of Systems  Constitutional: Negative.   Musculoskeletal: Negative.   Neurological: Negative.   Psychiatric/Behavioral: Negative.     Per HPI unless specifically indicated above     Objective:    BP 136/74   Pulse 72   Temp 98.1 F (36.7 C) (Oral)   Wt 247 lb 9.6 oz (112.3 kg)   LMP 12/24/2012 (Exact Date)   SpO2 97%   BMI 44.56 kg/m   Wt  Readings from Last 3 Encounters:  01/15/20 247 lb 9.6 oz (112.3 kg)  10/26/19 250 lb 9.6 oz (113.7 kg)  11/20/18 234 lb 12.6 oz (106.5 kg)    Physical Exam Vitals and nursing note reviewed.  Constitutional:      General: She is not in acute distress.    Appearance: Normal appearance. She is not toxic-appearing.  Musculoskeletal:        General: Normal range of motion.     Comments: Voluntarily moving all 4 extremities without issue.  Neurological:     Mental Status: She is alert and oriented to person, place, and time.     Motor: No weakness.     Gait: Gait normal.  Psychiatric:        Mood and Affect: Mood normal.        Behavior: Behavior normal.        Thought Content: Thought content  normal.        Judgment: Judgment normal.       Assessment & Plan:   Problem List Items Addressed This Visit      Other   Depression - Primary    Chronic, stable.  Follows with Psychiatrist and currently taking sertraline 200 mg daily.  Feels emotionally able to work with children.  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        Other Visit Diagnoses    Encounter for work capability assessment       Relevant Orders   QuantiFERON-TB Gold Plus     Will place future order for Quantiferon TB gold plus testing if workplaces requires further testing for TB.  Has history of a positive skin test and recent negative chest x-ray in 09/2019.  Follow up plan: Return for as scheduled with PCP.

## 2020-01-15 NOTE — Assessment & Plan Note (Addendum)
Chronic, stable.  Follows with Psychiatrist and currently taking sertraline 200 mg daily.  Feels emotionally able to work with children.  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

## 2020-02-01 ENCOUNTER — Ambulatory Visit: Payer: BC Managed Care – PPO | Admitting: Nurse Practitioner

## 2020-02-05 ENCOUNTER — Ambulatory Visit: Payer: BC Managed Care – PPO | Admitting: Nurse Practitioner

## 2020-05-20 ENCOUNTER — Telehealth: Payer: Self-pay

## 2020-05-20 NOTE — Telephone Encounter (Signed)
Faxed

## 2020-05-20 NOTE — Telephone Encounter (Signed)
Copied from CRM [id number]. Topic: General - Inquiry >> May 20, 2020  9:17 AM Daphine Deutscher D wrote: Reason for CRM: Pt called asking if a copy of her last chest xray could be faxed to [phone] attn: Arlana Pouch.    Pt's CB# [phone]

## 2020-06-08 ENCOUNTER — Telehealth: Payer: Self-pay

## 2020-06-08 ENCOUNTER — Ambulatory Visit: Payer: BC Managed Care – PPO | Admitting: Nurse Practitioner

## 2020-06-08 NOTE — Telephone Encounter (Signed)
lvm that this needed to be virtaul 06/08/2020 as provider will not be in office.

## 2020-06-17 ENCOUNTER — Other Ambulatory Visit: Payer: Self-pay

## 2020-06-17 ENCOUNTER — Telehealth (INDEPENDENT_AMBULATORY_CARE_PROVIDER_SITE_OTHER): Payer: BC Managed Care – PPO | Admitting: Nurse Practitioner

## 2020-06-17 ENCOUNTER — Encounter: Payer: Self-pay | Admitting: Nurse Practitioner

## 2020-06-17 VITALS — Wt 237.0 lb

## 2020-06-17 DIAGNOSIS — E7801 Familial hypercholesterolemia: Secondary | ICD-10-CM

## 2020-06-17 DIAGNOSIS — F322 Major depressive disorder, single episode, severe without psychotic features: Secondary | ICD-10-CM | POA: Diagnosis not present

## 2020-06-17 DIAGNOSIS — E559 Vitamin D deficiency, unspecified: Secondary | ICD-10-CM

## 2020-06-17 DIAGNOSIS — I10 Essential (primary) hypertension: Secondary | ICD-10-CM

## 2020-06-17 DIAGNOSIS — K591 Functional diarrhea: Secondary | ICD-10-CM

## 2020-06-17 DIAGNOSIS — F1020 Alcohol dependence, uncomplicated: Secondary | ICD-10-CM | POA: Insufficient documentation

## 2020-06-17 DIAGNOSIS — R7301 Impaired fasting glucose: Secondary | ICD-10-CM

## 2020-06-17 DIAGNOSIS — E039 Hypothyroidism, unspecified: Secondary | ICD-10-CM

## 2020-06-17 MED ORDER — NALTREXONE HCL 50 MG PO TABS: 50.0000 mg | ORAL_TABLET | Freq: Every day | ORAL | 5 refills | Status: DC

## 2020-06-17 NOTE — Assessment & Plan Note (Signed)
Chronic, ongoing.  Continue current medication regimen and adjust as needed.  Lipid panel outpatient. ?

## 2020-06-17 NOTE — Assessment & Plan Note (Signed)
Chronic, ongoing.  Continue current medication regimen and adjust as needed.  BP at goal recent visits.  Recommend she monitor BP at few days a week at home and focus on DASH diet. CMP, urine ALB, and lipid outpatient labs.  Refills up to date.  Return in 6 months for BP check.

## 2020-06-17 NOTE — Assessment & Plan Note (Signed)
Chronic, ongoing.  Continue daily supplement and recheck level outpatient, adjust as needed.

## 2020-06-17 NOTE — Progress Notes (Addendum)
Wt 237 lb (107.5 kg)   LMP 12/24/2012 (Exact Date)   BMI 42.66 kg/m    Subjective:    Patient ID: Jessica Brennan, female    DOB: 29-Aug-1965, 55 y.o.   MRN: [id number]  HPI: Jessica Brennan is a 55 y.o. female  Chief Complaint  Patient presents with  . Depression  . Referral    Requesting referral to gastroenterology   . Labs Only    Pt states she has recently quit drinking, requesting labs to check for vitamin deficiencies.     . This visit was completed via telephone due to the restrictions of the COVID-19 pandemic. All issues as above were discussed and addressed but no physical exam was performed. If it was felt that the patient should be evaluated in the office, they were directed there. The patient verbally consented to this visit. Patient was unable to complete an audio/visual visit due to Lack of equipment. Due to the catastrophic nature of the COVID-19 pandemic, this visit was done through audio contact only. . Location of the patient: home . Location of the provider: home . Those involved with this call:  . Provider: Aura DialsJolene Hriday Stai, DNP . CMA: Wilhemena DurieBrittany Russell, CMA . Front Desk/Registration: Harriet PhoJoliza Johnson  . Time spent on call: 21 minutes on the phone discussing health concerns. 15 minutes total spent in review of patient's record and preparation of their chart.  . I verified patient identity using two factors (patient name and date of birth). Patient consents verbally to being seen via telemedicine visit today.   Requesting referral to GI today -- Dr. Servando SnareWohl, missed visit in 2020 for her functional diarrhea and alcohol use.  HYPERTENSION / HYPERLIPIDEMIA Currently taking Norvasc and Lipitor (familial high cholesterol diagnosed in family).  Has history of elevated glucose, last A1C August 2021 5.4%.  She also has Vitamin D deficiency with last level 33.9.  No recent falls or fractures. Satisfied with current treatment? yes Duration of hypertension: chronic BP  monitoring frequency: not checking BP range:  BP medication side effects: no Duration of hyperlipidemia: chronic Cholesterol medication side effects: no Cholesterol supplements: none Medication compliance: good compliance Aspirin: no Recent stressors: no Recurrent headaches: no Visual changes: no Palpitations: no Dyspnea: no Chest pain: no Lower extremity edema: yes, at baseline Dizzy/lightheaded: no   HYPOTHYROIDISM Currently on Levothyroxine 75 MCG daily.  Last TSH 3.660 in August 2021. Thyroid control status:stable Satisfied with current treatment? yes Medication side effects: no Medication compliance: good compliance Etiology of hypothyroidism: unknown Recent dose adjustment:no Fatigue: yes, reports at baseline due to mood Cold intolerance: no Heat intolerance: no Weight gain: no Weight loss: no Constipation: no Diarrhea/loose stools: yes, occasional with coffee intake Palpitations: no Lower extremity edema: yes, with arthritis Anxiety/depressed mood: yes , her baseline  DEPRESSION Is going to psychiatry, Dr. Lutricia FeilKaput.  Reports long standing struggle with depression/anxiety + her profession as a Pension scheme managerspecial education teacher. Currently taking Zoloft 200 MG daily.  She reports for past two years has been drinking about 12 beers a night, reports she has not been honest about this with providers.  Quit drinking and is going to [employer] meetings recently, her sister who is a retired Engineer, civil (consulting)nurse requested that patient vitamin levels be assessed.  Reports she has drank like this for 3 years -- both parents are alcoholics.  Has not had a drink in 9 days. Mood status: stable Satisfied with current treatment?: no Symptom severity: moderate  Duration of current treatment : chronic Side effects: no Medication  compliance: good compliance Psychotherapy/counseling: yes in the past, not at present due to cost Previous psychiatric medications: effexor Depressed mood: yes Anxious mood:  yes Anhedonia: no Significant weight loss or gain: no Insomnia: none Fatigue: yes Feelings of worthlessness or guilt: yes Impaired concentration/indecisiveness: yes Suicidal ideations: none Hopelessness: yes Crying spells: yes Depression screen [facility] 2/9 06/17/2020 10/26/2019 05/14/2018 10/17/2017 03/30/2016  Decreased Interest 0 2 3 3 3   Down, Depressed, Hopeless 0 1 2 2 3   PHQ - 2 Score 0 3 5 5 6   Altered sleeping 0 0 0 1 3  Tired, decreased energy 1 1 3 3 3   Change in appetite 0 1 2 0 3  Feeling bad or failure about yourself  0 2 3 1 3   Trouble concentrating 0 0 3 0 1  Moving slowly or fidgety/restless 0 0 1 0 0  Suicidal thoughts 0 0 3 0 0  PHQ-9 Score 1 7 20 10 19   Difficult doing work/chores Extremely dIfficult Not difficult at all Very difficult - -   Flowsheet Row Video [facility]17/[facility]  Alcohol Use Disorder Identification Test Final Score (AUDIT) 19      Relevant past medical, surgical, family and social history reviewed and updated as indicated. Interim medical history since our last visit reviewed. Allergies and medications reviewed and updated.  Review of Systems  Constitutional: Negative for activity change, appetite change, diaphoresis, fatigue and fever.  Respiratory: Negative for cough, chest tightness and shortness of breath.   Cardiovascular: Negative for chest pain, palpitations and leg swelling.  Gastrointestinal: Negative.   Endocrine: Negative for cold intolerance, heat intolerance, polydipsia, polyphagia and polyuria.  Neurological: Negative.   Psychiatric/Behavioral: Negative.     Per HPI unless specifically indicated above     Objective:    Wt 237 lb (107.5 kg)   LMP 12/24/2012 (Exact Date)   BMI 42.66 kg/m   Wt Readings from Last 3 Encounters:  06/17/20 237 lb (107.5 kg)  01/15/20 247 lb 9.6 oz (112.3 kg)  10/26/19 250 lb 9.6 oz (113.7 kg)    Physical Exam   Unable to perform due to telephone visit  only.  Results for orders placed or performed in visit on 10/26/19  Bayer DCA Hb A1c Waived  Result Value Ref Range   HB A1C (BAYER DCA - WAIVED) 5.4 <7.0 %  Basic metabolic panel  Result Value Ref Range   Glucose 88 65 - 99 mg/dL   BUN 12 6 - 24 mg/dL   Creatinine, Ser [facility] 0.57 - 1.00 mg/dL   GFR calc non Af Amer 92 >59 mL/min/1.73   GFR calc Af Amer 106 >59 mL/min/1.73   BUN/Creatinine Ratio 16 9 - 23   Sodium 140 134 - 144 mmol/L   Potassium 4.5 3.5 - 5.2 mmol/L   Chloride 102 96 - 106 mmol/L   CO2 24 20 - 29 mmol/L   Calcium 8.7 8.7 - 10.2 mg/dL  Lipid Panel w/o Chol/HDL Ratio  Result Value Ref Range   Cholesterol, Total 264 (H) 100 - 199 mg/dL   Triglycerides 62 0 - 149 mg/dL   HDL 12/26/2012 06/19/20 mg/dL   VLDL Cholesterol Cal 10 5 - 40 mg/dL   LDL Chol Calc (NIH) 01/17/20 (H) 0 - 99 mg/dL  VITAMIN D 25 Hydroxy (Vit-D Deficiency, Fractures)  Result Value Ref Range   Vit D, 25-Hydroxy 33.9 30.0 - 100.0 ng/mL  TSH  Result Value Ref Range   TSH 3.660 0.450 - 4.500  uIU/mL  T4, free  Result Value Ref Range   Free T4 1.00 0.82 - 1.77 ng/dL  Microalbumin, Urine Waived  Result Value Ref Range   Microalb, Ur Waived 30 (H) 0 - 19 mg/L   Creatinine, Urine Waived 200 10 - 300 mg/dL   Microalb/Creat Ratio <30 <30 mg/g      Assessment & Plan:   Problem List Items Addressed This Visit      Cardiovascular and Mediastinum   Hypertension    Chronic, ongoing.  Continue current medication regimen and adjust as needed.  BP at goal recent visits.  Recommend she monitor BP at few days a week at home and focus on DASH diet. CMP, urine ALB, and lipid outpatient labs.  Refills up to date.  Return in 6 months for BP check.      Relevant Orders   Comprehensive metabolic panel   Microalbumin, Urine Waived     Endocrine   Hypothyroidism    Chronic, ongoing.  Continue current medication regimen, adjust as needed.  Check TSH and Free T4 outpatient -- also check thyroid antibody with underlying  alcohol use disorder -- could consider thyroid ultrasound in future.        Relevant Orders   TSH   T4, free   Thyroid peroxidase antibody     Other   Depression    Chronic, stable.  Follows with psychiatrist and currently taking Sertraline 200 mg daily.  Continue this collaboration and current regimen.  Recommend continued cessation of alcohol use.  Will trial Naltrexone, script sent in, to assist with cessation.  Continue AA meetings.      Vitamin D deficiency    Chronic, ongoing.  Continue daily supplement and recheck level outpatient, adjust as needed.      Relevant Orders   VITAMIN D 25 Hydroxy (Vit-D Deficiency, Fractures)   Hyperlipidemia    Chronic, ongoing.  Continue current medication regimen and adjust as needed.  Lipid panel outpatient.      Relevant Orders   Lipid Panel w/o Chol/HDL Ratio   Alcohol use disorder, moderate, dependence (HCC) - Primary    New diagnosis, patient AUDIT = 19.  Has been drinking heavier, 12 beers a night, for 3 years and family history of alcohol use disorder in both parents.  Recommend continued cessation of alcohol use.  Will trial Naltrexone, script sent in, to assist with cessation.  Continue AA meetings.  Return in 6 weeks.  Will obtain labs outpatient: CBC, CMP, GGT, TSH, iron/ferritin, folate, B12.      Relevant Orders   Iron, TIBC and Ferritin Panel   Vitamin B12   Comprehensive metabolic panel   CBC with Differential/Platelet   Gamma GT   Folate   Ambulatory referral to Gastroenterology    Other Visit Diagnoses    Functional diarrhea       Referral to GI placed per patient request, missed last visit -- is working on cutting back on alcohol use.   Relevant Orders   Ambulatory referral to Gastroenterology   IFG (impaired fasting glucose)       Recheck A1c outpatient.   Relevant Orders   HgB A1c      I discussed the assessment and treatment plan with the patient. The patient was provided an opportunity to ask questions and  all were answered. The patient agreed with the plan and demonstrated an understanding of the instructions.   The patient was advised to call back or seek an in-person evaluation if the symptoms  worsen or if the condition fails to improve as anticipated.   I provided 21+ minutes of time during this encounter.  Follow up plan: Return in about 6 weeks (around 07/29/2020) for Alcohol use disorder and mood.

## 2020-06-17 NOTE — Assessment & Plan Note (Signed)
Chronic, stable.  Follows with psychiatrist and currently taking Sertraline 200 mg daily.  Continue this collaboration and current regimen.  Recommend continued cessation of alcohol use.  Will trial Naltrexone, script sent in, to assist with cessation.  Continue AA meetings.

## 2020-06-17 NOTE — Assessment & Plan Note (Signed)
New diagnosis, patient AUDIT = 19.  Has been drinking heavier, 12 beers a night, for 3 years and family history of alcohol use disorder in both parents.  Recommend continued cessation of alcohol use.  Will trial Naltrexone, script sent in, to assist with cessation.  Continue AA meetings.  Return in 6 weeks.  Will obtain labs outpatient: CBC, CMP, GGT, TSH, iron/ferritin, folate, B12.

## 2020-06-17 NOTE — Assessment & Plan Note (Signed)
Chronic, ongoing.  Continue current medication regimen, adjust as needed.  Check TSH and Free T4 outpatient -- also check thyroid antibody with underlying alcohol use disorder -- could consider thyroid ultrasound in future.

## 2020-06-17 NOTE — Patient Instructions (Signed)
Rethinking drinking (NIH Publication No. 15-3770). Retrieved from https://pubs.niaaa.nih.gov/publications/RethinkingDrinking/Rethinking_Drinking.pdf">  Alcohol Use Disorder Alcohol use disorder is a condition in which drinking disrupts daily life. People with this condition drink too much alcohol and cannot control their drinking. Alcohol use disorder can cause serious problems with physical health. It can affect the brain, heart, and other internal organs. This disorder can raise the risk for certain cancers and cause problems with mental health, such as depression or anxiety. What are the causes? This condition is caused by drinking too much alcohol over time. Some people with this condition drink to cope with or escape from negative life events. Others drink to relieve pain or symptoms of mental illness. What increases the risk? You are more likely to develop this condition if:  You have a family history of alcohol use disorder.  Your culture encourages drinking to the point of becoming drunk (intoxication).  You had a mood or conduct disorder in childhood.  You have been abused.  You are an adolescent and you: ? Have poor performance in school. ? Have poor supervision or guidance. ? Act on impulse and like taking risks. What are the signs or symptoms? Symptoms of this condition include:  Drinking more than you want to.  Trying several times without success to drink less.  Spending a lot of time thinking about alcohol, getting alcohol, drinking, or recovering from drinking.  Continuing to drink even when it is causing serious problems in your daily life.  Drinking when it is dangerous to drink, such as before driving a car.  Needing more and more alcohol to get the same effect you want (building up tolerance).  Having symptoms of withdrawal when you stop drinking. Withdrawal symptoms may include: ? Trouble sleeping, leading to tiredness (fatigue). ? Mood swings of depression  and anxiety. ? Physical symptoms, such as a fast heart rate, rapid breathing, high blood pressure (hypertension), fever, cold sweats, or nausea. ? Seizures. ? Severe confusion. ? Feeling or seeing things that are not there (hallucinations). ? Shaking movements that you cannot control (tremors). How is this diagnosed? This condition is diagnosed with an assessment. Your health care provider may start by asking three or four questions about your drinking, or he or she may give you a simple test to take. This helps to get clear information from you. You may also have a physical exam or lab tests. You may be referred to a substance abuse counselor. How is this treated? With education, some people with alcohol use disorder are able to reduce their drinking. Many with this disorder cannot change their drinking behavior on their own and need help from substance use specialists. These specialists are counselors who can help diagnose how severe your disorder is and what type of treatment you need. Treatments may include:  Detoxification. Detoxification involves quitting drinking with supervision and direction of health care providers. Your health care provider may prescribe prescription medicines within the first week to help lessen withdrawal symptoms. Alcohol withdrawal can be dangerous and life-threatening. Detoxification may be provided in a home, community, or primary care setting, or in a hospital or substance use treatment facility.  Counseling. This may involve motivational interviewing (MI), family therapy, or cognitive behavioral therapy (CBT). It is provided by substance use treatment counselors or professional therapists. A counselor can address the things you can do to change your drinking behavior and how to maintain the changes. Talk therapy aims to: ? Identify your positive motivations to change. ? Identify and avoid the   things that trigger your drinking. ? Help you learn how to plan your  behavior change. ? Develop support systems that can help you sustain the change.  Medicines. Medicines can help treat this disorder by: ? Decreasing cravings. ? Decreasing the positive feeling you have when you drink. ? Causing an uncomfortable physical reaction when you drink (aversion therapy).  Mutual help groups such as Alcoholics Anonymous (AA). These groups are led by people who have quit drinking. The groups provide emotional support, advice, and guidance. Some people with this condition benefit from a combination of treatments provided by specialized substance use treatment centers.   Follow these instructions at home: Medicines  Take over-the-counter and prescription medicines only as told by your health care provider.  Ask before starting any new medicines, herbs, or supplements. General instructions  Ask friends and family members to support your choice to stay sober.  Avoid situations where alcohol is served.  Create a plan to deal with tempting situations.  Attend support groups regularly.  Practice hobbies or activities you enjoy.  Do not drink and drive.  Keep all follow-up visits as told by your health care provider. This is important.   How is this prevented?  If you drink alcohol: ? Limit how much you use to:  0-1 drink a day for nonpregnant women.  0-2 drinks a day for men. ? Be aware of how much alcohol is in your drink. In the [place] one drink equals one 12 oz bottle of beer (355 mL), one 5 oz glass of wine (148 mL), or one 1 oz glass of hard liquor (44 mL).  If you have a mental health condition, seek treatment. Develop a healthy lifestyle through: ? Meditation or deep breathing. ? Exercise. ? Spending time in nature. ? Listening to music. ? Talking with a trusted friend or family member.  If you are an adolescent: ? Do not drink alcohol. Avoid gatherings where you might be tempted. ? Do not be afraid to say no if someone offers you alcohol.  Speak up about why you do not want to drink. Set a positive example for others around you by not drinking. ? Build relationships with friends who do not drink. Where to find more information  Substance Abuse and Mental Health Services Administration: samhsa.gov  Alcoholics Anonymous: aa.org Contact a health care provider if:  You cannot take your medicines as told.  Your symptoms get worse or you experience symptoms of withdrawal when you stop drinking.  You start drinking again (relapse) and your symptoms get worse. Get help right away if:  You have thoughts about hurting yourself or others. If you ever feel like you may hurt yourself or others, or have thoughts about taking your own life, get help right away. Go to your nearest emergency department or:  Call your local emergency services (911 in the [place]  Call a suicide crisis helpline, such as the National Suicide Prevention Lifeline at 1-800-273-8255. This is open 24 hours a day in the [place]  Text the Crisis Text Line at 741741 (in the [place] Summary  Alcohol use disorder is a condition in which drinking disrupts daily life. People with this condition drink too much alcohol and cannot control their drinking.  Treatment may include detoxification, counseling, medicines, and support groups.  Ask friends and family members to support you. Avoid situations where alcohol is served.  Get help right away if you have thoughts about hurting yourself or others. This information is not intended to replace   advice given to you by your health care provider. Make sure you discuss any questions you have with your health care provider. Document Revised: 01/01/2019 Document Reviewed: 01/01/2019 Elsevier Patient Education  2021 Elsevier Inc.  

## 2020-06-20 ENCOUNTER — Encounter: Payer: Self-pay | Admitting: *Deleted

## 2020-06-29 ENCOUNTER — Other Ambulatory Visit: Payer: BC Managed Care – PPO

## 2020-06-29 ENCOUNTER — Other Ambulatory Visit: Payer: Self-pay

## 2020-06-29 DIAGNOSIS — E559 Vitamin D deficiency, unspecified: Secondary | ICD-10-CM

## 2020-06-29 DIAGNOSIS — I10 Essential (primary) hypertension: Secondary | ICD-10-CM

## 2020-06-29 DIAGNOSIS — E7801 Familial hypercholesterolemia: Secondary | ICD-10-CM

## 2020-06-29 DIAGNOSIS — E039 Hypothyroidism, unspecified: Secondary | ICD-10-CM

## 2020-06-29 DIAGNOSIS — F1020 Alcohol dependence, uncomplicated: Secondary | ICD-10-CM

## 2020-06-29 DIAGNOSIS — R7301 Impaired fasting glucose: Secondary | ICD-10-CM

## 2020-06-29 LAB — MICROALBUMIN, URINE WAIVED
Creatinine, Urine Waived: 200 mg/dL (ref 10–300)
Microalb, Ur Waived: 10 mg/L (ref 0–19)
Microalb/Creat Ratio: <30 mg/g (ref <30)

## 2020-06-30 LAB — COMPREHENSIVE METABOLIC PANEL
ALT: 19 IU/L (ref 0–32)
AST: 16 IU/L (ref 0–40)
Albumin/Globulin Ratio: 1.7 (ref 1.2–2.2)
Albumin: 4.6 g/dL (ref 3.8–4.9)
Alkaline Phosphatase: 80 IU/L (ref 44–121)
BUN/Creatinine Ratio: 16 (ref 9–23)
BUN: 11 mg/dL (ref 6–24)
Bilirubin Total: 0.2 mg/dL (ref 0.0–1.2)
CO2: 21 mmol/L (ref 20–29)
Calcium: 9.2 mg/dL (ref 8.7–10.2)
Chloride: 104 mmol/L (ref 96–106)
Creatinine, Ser: 0.69 mg/dL (ref 0.57–1.00)
Globulin, Total: 2.7 g/dL (ref 1.5–4.5)
Glucose: 97 mg/dL (ref 65–99)
Potassium: 4.6 mmol/L (ref 3.5–5.2)
Sodium: 142 mmol/L (ref 134–144)
Total Protein: 7.3 g/dL (ref 6.0–8.5)
eGFR: 101 mL/min/{1.73_m2} (ref >59)

## 2020-06-30 LAB — CBC WITH DIFFERENTIAL/PLATELET
Basophils Absolute: 0 10*3/uL (ref 0.0–0.2)
Basos: 0 %
EOS (ABSOLUTE): 0.4 10*3/uL (ref 0.0–0.4)
Eos: 3 %
Hematocrit: 43.4 % (ref 34.0–46.6)
Hemoglobin: 14.5 g/dL (ref 11.1–15.9)
Immature Grans (Abs): 0 10*3/uL (ref 0.0–0.1)
Immature Granulocytes: 0 %
Lymphocytes Absolute: 3 10*3/uL (ref 0.7–3.1)
Lymphs: 25 %
MCH: 29.7 pg (ref 26.6–33.0)
MCHC: 33.4 g/dL (ref 31.5–35.7)
MCV: 89 fL (ref 79–97)
Monocytes Absolute: 0.6 10*3/uL (ref 0.1–0.9)
Monocytes: 5 %
Neutrophils Absolute: 7.8 10*3/uL — ABNORMAL HIGH (ref 1.4–7.0)
Neutrophils: 67 %
Platelets: 249 10*3/uL (ref 150–450)
RBC: 4.89 x10E6/uL (ref 3.77–5.28)
RDW: 13.1 % (ref 11.7–15.4)
WBC: 11.9 10*3/uL — ABNORMAL HIGH (ref 3.4–10.8)

## 2020-06-30 LAB — FOLATE: Folate: 4.7 ng/mL (ref >3.0)

## 2020-06-30 LAB — LIPID PANEL W/O CHOL/HDL RATIO
Cholesterol, Total: 255 mg/dL — ABNORMAL HIGH (ref 100–199)
HDL: 113 mg/dL (ref >39)
LDL Chol Calc (NIH): 123 mg/dL — ABNORMAL HIGH (ref 0–99)
Triglycerides: 114 mg/dL (ref 0–149)
VLDL Cholesterol Cal: 19 mg/dL (ref 5–40)

## 2020-06-30 LAB — THYROID PEROXIDASE ANTIBODY: Thyroperoxidase Ab SerPl-aCnc: <8 IU/mL (ref 0–34)

## 2020-06-30 LAB — IRON,TIBC AND FERRITIN PANEL
Ferritin: 121 ng/mL (ref 15–150)
Iron Saturation: 22 % (ref 15–55)
Iron: 84 ug/dL (ref 27–159)
Total Iron Binding Capacity: 390 ug/dL (ref 250–450)
UIBC: 306 ug/dL (ref 131–425)

## 2020-06-30 LAB — VITAMIN B12: Vitamin B-12: 487 pg/mL (ref 232–1245)

## 2020-06-30 LAB — TSH: TSH: 4.14 u[IU]/mL (ref 0.450–4.500)

## 2020-06-30 LAB — VITAMIN D 25 HYDROXY (VIT D DEFICIENCY, FRACTURES): Vit D, 25-Hydroxy: 33.3 ng/mL (ref 30.0–100.0)

## 2020-06-30 LAB — GAMMA GT: GGT: 13 IU/L (ref 0–60)

## 2020-06-30 LAB — T4, FREE: Free T4: 0.95 ng/dL (ref 0.82–1.77)

## 2020-06-30 LAB — HEMOGLOBIN A1C
Est. average glucose Bld gHb Est-mCnc: 120 mg/dL
Hgb A1c MFr Bld: 5.8 % — ABNORMAL HIGH (ref 4.8–5.6)

## 2020-06-30 NOTE — Progress Notes (Signed)
Good morning, please let Marielys know her labs have returned: - A1c is in prediabetic range.  Your number is 5.8%, meaning you are prediabetic.  Any number 5.7 to 6.4 is considered prediabetes and any number 6.5 or greater is considered diabetes.   I would recommend heavy focus on decreasing foods high in sugar and your intake of things like bread products, pasta, and rice.  The American Diabetes Association online has a large amount of information on diet changes to make.  We will recheck this number in 3 months. - Thyroid level is normal, continue current Levothyroxine dosing.   - Cholesterol levels above goal, please ensure you take Atorvastatin daily and we will recheck next visit, if ongoing elevations may increase dose of Atorvastatin.   - CBC shows mild elevation in white blood cell count and neutrophils mild elevation, have you been sick recently?  I would like you to return in 4 weeks for a lab visit only to recheck these.  Schedule this. - Remainder of labs are nice and normal.  Any questions? Keep being awesome!!  Thank you for allowing me to participate in your care.  I appreciate you. Kindest regards, [facility]

## 2020-08-02 ENCOUNTER — Ambulatory Visit: Payer: Self-pay | Admitting: Nurse Practitioner

## 2020-08-16 ENCOUNTER — Ambulatory Visit: Payer: Self-pay | Admitting: Nurse Practitioner

## 2020-09-04 ENCOUNTER — Encounter: Payer: Self-pay | Admitting: Nurse Practitioner

## 2020-09-04 DIAGNOSIS — R7309 Other abnormal glucose: Secondary | ICD-10-CM | POA: Insufficient documentation

## 2020-09-05 ENCOUNTER — Ambulatory Visit: Payer: BC Managed Care – PPO | Admitting: Nurse Practitioner

## 2020-09-05 ENCOUNTER — Other Ambulatory Visit: Payer: Self-pay

## 2020-09-05 ENCOUNTER — Encounter: Payer: Self-pay | Admitting: Nurse Practitioner

## 2020-09-05 VITALS — BP 126/69 | HR 73 | Temp 98.5°F | Wt 246.0 lb

## 2020-09-05 DIAGNOSIS — F322 Major depressive disorder, single episode, severe without psychotic features: Secondary | ICD-10-CM | POA: Diagnosis not present

## 2020-09-05 DIAGNOSIS — Z1231 Encounter for screening mammogram for malignant neoplasm of breast: Secondary | ICD-10-CM

## 2020-09-05 DIAGNOSIS — Z23 Encounter for immunization: Secondary | ICD-10-CM | POA: Diagnosis not present

## 2020-09-05 DIAGNOSIS — F1020 Alcohol dependence, uncomplicated: Secondary | ICD-10-CM

## 2020-09-05 MED ORDER — SHINGRIX 50 MCG/0.5ML IM SUSR: 0.5000 mL | Freq: Once | INTRAMUSCULAR | 0 refills | Status: AC

## 2020-09-05 NOTE — Assessment & Plan Note (Signed)
Improving, patient AUDIT = 19 last visit and 3 today.  Has been drinking heavier, 12 beers a night, for 3 years and family history of alcohol use disorder in both parents.  Recommend continued cut back of alcohol use.  Did not tolerate Naltrexone, will discontinue.  Continue AA meetings.  Return in 3 months for follow-up.

## 2020-09-05 NOTE — Progress Notes (Signed)
BP 126/69   Pulse 73   Temp 98.5 F (36.9 C) (Oral)   Wt 246 lb (111.6 kg)   LMP 12/24/2012 (Exact Date)   SpO2 97%   BMI 44.28 kg/m    Subjective:    Patient ID: Jessica Brennan, female    DOB: 03-Sep-1965, 55 y.o.   MRN: [id number]  HPI: Jessica Brennan is a 55 y.o. female  Chief Complaint  Patient presents with   Alcohol Problem   Mood   DEPRESSION Is going to psychiatry, Dr. Leta Speller -- last saw two weeks ago.  Reports long standing struggle with depression/anxiety + her profession as a Chief Technology Officer. Currently taking Zoloft 200 MG daily.   Started on Naltrexone on 06/17/20 -- she reports had to stop taking due to extreme fatigue.  Had reported last visit that for past two years had been drinking about 12 beers a night, reports she had not been honest about this with providers.  Currently has cut back to 4 beers a sitting, last drank Friday.  Is going to [employer], does have support via here.  Reports she has drank like this for 3 years -- both parents are alcoholics.   Mood status: stable Satisfied with current treatment?: no Symptom severity: moderate  Duration of current treatment : chronic Side effects: no Medication compliance: good compliance Psychotherapy/counseling: yes in the past, not at present due to cost Previous psychiatric medications: effexor Depressed mood: yes Anxious mood: yes Anhedonia: no Significant weight loss or gain: no Insomnia: none Fatigue: yes Feelings of worthlessness or guilt: yes Impaired concentration/indecisiveness: yes Suicidal ideations: none Hopelessness: yes Crying spells: yes Depression screen [facility] 2/9 09/05/2020 06/17/2020 10/26/2019 05/14/2018 10/17/2017  Decreased Interest 3 0 _0 Down, Depressed, Hopeless 2 0 _1 PHQ - 2 Score 5 0 _2 Altered sleeping 0 0 0 0 1  Tired, decreased energy _3 Change in appetite 1 0 1 2 0  Feeling bad or failure about yourself  2 0 _4 Trouble concentrating 0 0  0 3 0  Moving slowly or fidgety/restless 0 0 0 1 0  Suicidal thoughts 2 0 0 3 0  PHQ-9 Score _5 Difficult doing work/chores Very difficult Extremely dIfficult Not difficult at all Very difficult -    Flowsheet Row Video Visit from 06/17/2020 in [facility]  Alcohol Use Disorder Identification Test Final Score (AUDIT) 19       Flowsheet Row Office Visit from 09/05/2020 in [facility]  AUDIT-C Score 3        Relevant past medical, surgical, family and social history reviewed and updated as indicated. Interim medical history since our last visit reviewed. Allergies and medications reviewed and updated.  Review of Systems  Constitutional:  Negative for activity change, appetite change, diaphoresis, fatigue and fever.  Respiratory:  Negative for cough, chest tightness and shortness of breath.   Cardiovascular:  Negative for chest pain, palpitations and leg swelling.  Gastrointestinal: Negative.   Endocrine: Negative for cold intolerance, heat intolerance, polydipsia, polyphagia and polyuria.  Neurological: Negative.   Psychiatric/Behavioral: Negative.     Per HPI unless specifically indicated above     Objective:    BP 126/69   Pulse 73   Temp 98.5 F (36.9 C) (Oral)   Wt 246 lb (111.6 kg)   LMP 12/24/2012 (Exact Date)   SpO2 97%   BMI 44.28  kg/m   Wt Readings from Last 3 Encounters:  09/05/20 246 lb (111.6 kg)  06/17/20 237 lb (107.5 kg)  01/15/20 247 lb 9.6 oz (112.3 kg)    Physical Exam Vitals and nursing note reviewed.  Constitutional:      General: She is awake. She is not in acute distress.    Appearance: She is well-developed and well-groomed. She is obese. She is not ill-appearing or toxic-appearing.  HENT:     Head: Normocephalic.     Right Ear: Hearing normal.     Left Ear: Hearing normal.     Nose: Nose normal.     Mouth/Throat:     Mouth: Mucous membranes are moist.  Eyes:     General: Lids are normal.         Right eye: No discharge.        Left eye: No discharge.     Conjunctiva/sclera: Conjunctivae normal.     Pupils: Pupils are equal, round, and reactive to light.  Neck:     Thyroid: No thyromegaly.     Vascular: No carotid bruit or JVD.  Cardiovascular:     Rate and Rhythm: Normal rate and regular rhythm.     Heart sounds: Normal heart sounds. No murmur heard.   No gallop.  Pulmonary:     Effort: Pulmonary effort is normal. No accessory muscle usage or respiratory distress.     Breath sounds: Normal breath sounds.  Abdominal:     General: Bowel sounds are normal.     Palpations: Abdomen is soft. There is no hepatomegaly or splenomegaly.  Musculoskeletal:     Cervical back: Normal range of motion and neck supple.     Right lower leg: No edema.     Left lower leg: No edema.  Lymphadenopathy:     Cervical: No cervical adenopathy.  Skin:    General: Skin is warm and dry.  Neurological:     Mental Status: She is alert and oriented to person, place, and time.  Psychiatric:        Attention and Perception: Attention normal.        Mood and Affect: Mood normal.        Speech: Speech normal.        Behavior: Behavior normal. Behavior is cooperative.        Thought Content: Thought content normal.    Results for orders placed or performed in visit on 06/29/20  Microalbumin, Urine Waived  Result Value Ref Range   Microalb, Ur Waived 10 0 - 19 mg/L   Creatinine, Urine Waived 200 10 - 300 mg/dL   Microalb/Creat Ratio <30 <30 mg/g  HgB A1c  Result Value Ref Range   Hgb A1c MFr Bld 5.8 (H) 4.8 - 5.6 %   Est. average glucose Bld gHb Est-mCnc 120 mg/dL  Thyroid peroxidase antibody  Result Value Ref Range   Thyroperoxidase Ab SerPl-aCnc <8 0 - 34 IU/mL  Folate  Result Value Ref Range   Folate 4.7 >3.0 ng/mL  Gamma GT  Result Value Ref Range   GGT 13 0 - 60 IU/L  CBC with Differential/Platelet  Result Value Ref Range   WBC 11.9 (H) 3.4 - 10.8 x10E3/uL   RBC 4.89 3.77 - 5.28  x10E6/uL   Hemoglobin 14.5 11.1 - 15.9 g/dL   Hematocrit 43.4 34.0 - 46.6 %   MCV 89 79 - 97 fL   MCH 29.7 26.6 - 33.0 pg   MCHC 33.4 31.5 - 35.7 g/dL  RDW 13.1 11.7 - 15.4 %   Platelets 249 150 - 450 x10E3/uL   Neutrophils 67 Not Estab. %   Lymphs 25 Not Estab. %   Monocytes 5 Not Estab. %   Eos 3 Not Estab. %   Basos 0 Not Estab. %   Neutrophils Absolute 7.8 (H) 1.4 - 7.0 x10E3/uL   Lymphocytes Absolute 3.0 0.7 - 3.1 x10E3/uL   Monocytes Absolute 0.6 0.1 - 0.9 x10E3/uL   EOS (ABSOLUTE) 0.4 0.0 - 0.4 x10E3/uL   Basophils Absolute 0.0 0.0 - 0.2 x10E3/uL   Immature Granulocytes 0 Not Estab. %   Immature Grans (Abs) 0.0 0.0 - 0.1 x10E3/uL  T4, free  Result Value Ref Range   Free T4 0.95 0.82 - 1.77 ng/dL  Comprehensive metabolic panel  Result Value Ref Range   Glucose 97 65 - 99 mg/dL   BUN 11 6 - 24 mg/dL   Creatinine, Ser 0.69 0.57 - 1.00 mg/dL   eGFR 101 >59 mL/min/1.73   BUN/Creatinine Ratio 16 9 - 23   Sodium 142 134 - 144 mmol/L   Potassium 4.6 3.5 - 5.2 mmol/L   Chloride 104 96 - 106 mmol/L   CO2 21 20 - 29 mmol/L   Calcium 9.2 8.7 - 10.2 mg/dL   Total Protein 7.3 6.0 - 8.5 g/dL   Albumin 4.6 3.8 - 4.9 g/dL   Globulin, Total 2.7 1.5 - 4.5 g/dL   Albumin/Globulin Ratio 1.7 1.2 - 2.2   Bilirubin Total 0.2 0.0 - 1.2 mg/dL   Alkaline Phosphatase 80 44 - 121 IU/L   AST 16 0 - 40 IU/L   ALT 19 0 - 32 IU/L  Lipid Panel w/o Chol/HDL Ratio  Result Value Ref Range   Cholesterol, Total 255 (H) 100 - 199 mg/dL   Triglycerides 114 0 - 149 mg/dL   HDL 113 >39 mg/dL   VLDL Cholesterol Cal 19 5 - 40 mg/dL   LDL Chol Calc (NIH) 123 (H) 0 - 99 mg/dL  TSH  Result Value Ref Range   TSH 4.140 0.450 - 4.500 uIU/mL  VITAMIN D 25 Hydroxy (Vit-D Deficiency, Fractures)  Result Value Ref Range   Vit D, 25-Hydroxy 33.3 30.0 - 100.0 ng/mL  Vitamin B12  Result Value Ref Range   Vitamin B-12 487 232 - 1,245 pg/mL  Iron, TIBC and Ferritin Panel  Result Value Ref Range   Total Iron  Binding Capacity 390 250 - 450 ug/dL   UIBC 306 131 - 425 ug/dL   Iron 84 27 - 159 ug/dL   Iron Saturation 22 15 - 55 %   Ferritin 121 15 - 150 ng/mL      Assessment & Plan:   Problem List Items Addressed This Visit       Other   Depression    Chronic, stable.  Follows with psychiatrist and currently taking Sertraline 200 mg daily.  Continue this collaboration and current regimen.  Recommend continued cut back of alcohol use.  Will discontinue Naltrexone, did not tolerate.  Continue AA meetings.       Morbid obesity (HCC)    BMI 44.28 with HTN.  Recommended eating smaller high protein, low fat meals more frequently and exercising 30 mins a day 5 times a week with a goal of 10-15lb weight loss in the next 3 months. Patient voiced their understanding and motivation to adhere to these recommendations.        Alcohol use disorder, moderate, dependence ([facility]) - Primary  Improving, patient AUDIT = 19 last visit and 3 today.  Has been drinking heavier, 12 beers a night, for 3 years and family history of alcohol use disorder in both parents.  Recommend continued cut back of alcohol use.  Did not tolerate Naltrexone, will discontinue.  Continue AA meetings.  Return in 3 months for follow-up.       Other Visit Diagnoses     Need for shingles vaccine       Shingrix ordered to pharmacy   Encounter for screening mammogram for malignant neoplasm of breast       Mammogram ordered and phone number provided to schedule   Relevant Orders   MM 3D SCREEN BREAST BILATERAL        Follow up plan: Return in about 3 months (around 12/06/2020) for HTN/HLD, MOOD, ALCOHOL ABUSE, HYPOTHYROID, VIT D.

## 2020-09-05 NOTE — Assessment & Plan Note (Addendum)
Chronic, stable.  Follows with psychiatrist and currently taking Sertraline 200 mg daily.  Continue this collaboration and current regimen.  Recommend continued cut back of alcohol use.  Will discontinue Naltrexone, did not tolerate.  Continue AA meetings.

## 2020-09-05 NOTE — Patient Instructions (Signed)
[facility]  Address: [address], [place], [place] [address]  Phone: [phone]  Mammogram A mammogram is an X-ray of the breasts. This is done to check for changes that are not normal. This test can look for changes that may be caused by breast cancer or other problems. Mammograms are regularly done on women beginning at age 57. A man may have a mammogram if he has a lump or swelling in his breast. Tell a doctor: About any allergies you have. If you have breast implants. If you have had breast disease, biopsy, or surgery. If you have a family history of breast cancer. If you are breastfeeding. Whether you are pregnant or may be pregnant. What are the risks? Generally, this is a safe procedure. But problems may occur, including: Being exposed to radiation. Radiation levels are very low with this test. The need for more tests. The results were not read properly. Trouble finding breast cancer in women with dense breasts. What happens before the test? Have this test done about 1-2 weeks after your menstrual period. This is often when your breasts are the least tender. If you are visiting a new doctor or clinic, have any past mammogram images sent to your new doctor's office. Wash your breasts and under your arms on the day of the test. Do not use deodorants, perfumes, lotions, or powders on the day of the test. Take off any jewelry from your neck. Wear clothes that you can change into and out of easily. What happens during the test?  You will take off your clothes from the waist up. You will put on a gown. You will stand in front of the X-ray machine. Each breast will be placed between two plastic or glass plates. The plates will press down on your breast for a few seconds. Try to relax. This does not cause any harm to your breasts. It may not feel comfortable, but it will be very brief. X-rays will be taken from different angles of each  breast. The procedure may vary among doctors and hospitals. What can I expect after the test? The mammogram will be read by a specialist (radiologist). You may need to do parts of the test again. This depends on the quality of the images. You may go back to your normal activities. It is up to you to get the results of your test. Ask how to get your results when they are ready. Summary A mammogram is an X-ray of the breasts. It looks for changes that may be caused by breast cancer or other problems. A man may have this test if he has a lump or swelling in his breast. Before the test, tell your doctor about any breast problems that you have had in the past. Have this test done about 1-2 weeks after your menstrual period. Ask when your test results will be ready. Make sure you get your test results. This information is not intended to replace advice given to you by your health care provider. Make sure you discuss any questions you have with your health care provider. Document Revised: 12/14/2019 Document Reviewed: 12/14/2019 Elsevier Patient Education  2022 Elsevier Inc.   

## 2020-09-05 NOTE — Assessment & Plan Note (Signed)
BMI 44.28 with HTN.  Recommended eating smaller high protein, low fat meals more frequently and exercising 30 mins a day 5 times a week with a goal of 10-15lb weight loss in the next 3 months. Patient voiced their understanding and motivation to adhere to these recommendations.

## 2020-09-09 ENCOUNTER — Ambulatory Visit: Payer: Self-pay

## 2020-09-16 ENCOUNTER — Ambulatory Visit: Payer: BC Managed Care – PPO | Admitting: Nurse Practitioner

## 2020-09-16 ENCOUNTER — Encounter: Payer: Self-pay | Admitting: Nurse Practitioner

## 2020-09-16 ENCOUNTER — Ambulatory Visit
Admission: RE | Admit: 2020-09-16 | Discharge: 2020-09-16 | Disposition: A | Discharge status: Home / Self Care | Payer: BC Managed Care – PPO | Source: Ambulatory Visit | Attending: Nurse Practitioner | Admitting: Nurse Practitioner

## 2020-09-16 ENCOUNTER — Other Ambulatory Visit: Payer: Self-pay

## 2020-09-16 ENCOUNTER — Ambulatory Visit: Payer: Self-pay | Admitting: *Deleted

## 2020-09-16 DIAGNOSIS — U071 COVID-19: Secondary | ICD-10-CM | POA: Diagnosis present

## 2020-09-16 DIAGNOSIS — R0602 Shortness of breath: Secondary | ICD-10-CM

## 2020-09-16 MED ORDER — BENZONATATE 200 MG PO CAPS: 200.0000 mg | ORAL_CAPSULE | Freq: Two times a day (BID) | ORAL | 0 refills | Status: DC | PRN

## 2020-09-16 MED ORDER — ALBUTEROL SULFATE HFA 108 (90 BASE) MCG/ACT IN AERS: 2.0000 | INHALATION_SPRAY | Freq: Four times a day (QID) | RESPIRATORY_TRACT | 2 refills | Status: AC | PRN

## 2020-09-16 NOTE — Telephone Encounter (Signed)
FYI scheduled today 

## 2020-09-16 NOTE — Telephone Encounter (Signed)
Pt. Started feeling bad 09/09/20. Sister was positive as well. Did a home test Wednesday - positive. Has cough with chest congestion. Has wheezing and shortness of breath,reports history of asthma. Concerned about her cough. Virtual appointment made.

## 2020-09-16 NOTE — Telephone Encounter (Signed)
Reason for Disposition . MILD difficulty breathing (e.g., minimal/no SOB at rest, SOB with walking, pulse <100)  Answer Assessment - Initial Assessment Questions 1. COVID-19 DIAGNOSIS: "Who made your COVID-19 diagnosis?" "Was it confirmed by a positive lab test or self-test?" If not diagnosed by a doctor (or NP/PA), ask "Are there lots of cases (community spread) where you live?" Note: See public health department website, if unsure.     Home test 2. COVID-19 EXPOSURE: "Was there any known exposure to COVID before the symptoms began?" CDC Definition of close contact: within 6 feet (2 meters) for a total of 15 minutes or more over a 24-hour period.      Yes 3. ONSET: "When did the COVID-19 symptoms start?"      09/09/20 4. WORST SYMPTOM: "What is your worst symptom?" (e.g., cough, fever, shortness of breath, muscle aches)     Cough 5. COUGH: "Do you have a cough?" If Yes, ask: "How bad is the cough?"       Severe 6. FEVER: "Do you have a fever?" If Yes, ask: "What is your temperature, how was it measured, and when did it start?"     No 7. RESPIRATORY STATUS: "Describe your breathing?" (e.g., shortness of breath, wheezing, unable to speak)      Wheezing, shortness of breath at rest 8. BETTER-SAME-WORSE: "Are you getting better, staying the same or getting worse compared to yesterday?"  If getting worse, ask, "In what way?"     Same 9. HIGH RISK DISEASE: "Do you have any chronic medical problems?" (e.g., asthma, heart or lung disease, weak immune system, obesity, etc.)     Asthma, HTN,  10. VACCINE: "Have you had the COVID-19 vaccine?" If Yes, ask: "Which one, how many shots, when did you get it?"       Yes 11. BOOSTER: "Have you received your COVID-19 booster?" If Yes, ask: "Which one and when did you get it?"       X 1 12. PREGNANCY: "Is there any chance you are pregnant?" "When was your last menstrual period?"       No 13. OTHER SYMPTOMS: "Do you have any other symptoms?"  (e.g., chills,  fatigue, headache, loss of smell or taste, muscle pain, sore throat)       Nasal congestion 14. O2 SATURATION MONITOR:  "Do you use an oxygen saturation monitor (pulse oximeter) at home?" If Yes, ask "What is your reading (oxygen level) today?" "What is your usual oxygen saturation reading?" (e.g., 95%)       No  Protocols used: Coronavirus (COVID-19) Diagnosed or Suspected-A-AH

## 2020-09-16 NOTE — Progress Notes (Signed)
v  LMP 12/24/2012 (Exact Date)    Subjective:    Patient ID: Jessica Brennan, female    DOB: 1965-06-01, 55 y.o.   MRN: [id number]  HPI: Jessica Brennan is a 55 y.o. female  Chief Complaint  Patient presents with   Covid Positive    Tested positive on Wednesday, symptoms started last Friday.  Cough is the main concern today    UPPER RESPIRATORY TRACT INFECTION Patient tested positive on Wednesday for COVID 19. Symptoms started last Friday. Patient's main concern is her cough at this point. She states she is having some SOB, wheezing and chest tightness. Denies fevers.  Patient is using her inhaler as needed.    Relevant past medical, surgical, family and social history reviewed and updated as indicated. Interim medical history since our last visit reviewed. Allergies and medications reviewed and updated.  Review of Systems  Constitutional:  Negative for fever.  Respiratory:  Positive for cough, chest tightness, shortness of breath and wheezing.    Per HPI unless specifically indicated above     Objective:    LMP 12/24/2012 (Exact Date)   Wt Readings from Last 3 Encounters:  09/05/20 246 lb (111.6 kg)  06/17/20 237 lb (107.5 kg)  01/15/20 247 lb 9.6 oz (112.3 kg)    Physical Exam Vitals and nursing note reviewed.  Pulmonary:     Effort: Pulmonary effort is normal. No respiratory distress.  Neurological:     Mental Status: She is alert.  Psychiatric:        Mood and Affect: Mood normal.        Behavior: Behavior normal.        Thought Content: Thought content normal.        Judgment: Judgment normal.    Results for orders placed or performed in visit on 06/29/20  Microalbumin, Urine Waived  Result Value Ref Range   Microalb, Ur Waived 10 0 - 19 mg/L   Creatinine, Urine Waived 200 10 - 300 mg/dL   Microalb/Creat Ratio <30 <30 mg/g  HgB A1c  Result Value Ref Range   Hgb A1c MFr Bld 5.8 (H) 4.8 - 5.6 %   Est. average glucose Bld gHb Est-mCnc 120 mg/dL   Thyroid peroxidase antibody  Result Value Ref Range   Thyroperoxidase Ab SerPl-aCnc <8 0 - 34 IU/mL  Folate  Result Value Ref Range   Folate 4.7 >3.0 ng/mL  Gamma GT  Result Value Ref Range   GGT 13 0 - 60 IU/L  CBC with Differential/Platelet  Result Value Ref Range   WBC 11.9 (H) 3.4 - 10.8 x10E3/uL   RBC 4.89 3.77 - 5.28 x10E6/uL   Hemoglobin 14.5 11.1 - 15.9 g/dL   Hematocrit 43.4 34.0 - 46.6 %   MCV 89 79 - 97 fL   MCH 29.7 26.6 - 33.0 pg   MCHC 33.4 31.5 - 35.7 g/dL   RDW 13.1 11.7 - 15.4 %   Platelets 249 150 - 450 x10E3/uL   Neutrophils 67 Not Estab. %   Lymphs 25 Not Estab. %   Monocytes 5 Not Estab. %   Eos 3 Not Estab. %   Basos 0 Not Estab. %   Neutrophils Absolute 7.8 (H) 1.4 - 7.0 x10E3/uL   Lymphocytes Absolute 3.0 0.7 - 3.1 x10E3/uL   Monocytes Absolute 0.6 0.1 - 0.9 x10E3/uL   EOS (ABSOLUTE) 0.4 0.0 - 0.4 x10E3/uL   Basophils Absolute 0.0 0.0 - 0.2 x10E3/uL   Immature Granulocytes 0 Not Estab. %  Immature Grans (Abs) 0.0 0.0 - 0.1 x10E3/uL  T4, free  Result Value Ref Range   Free T4 0.95 0.82 - 1.77 ng/dL  Comprehensive metabolic panel  Result Value Ref Range   Glucose 97 65 - 99 mg/dL   BUN 11 6 - 24 mg/dL   Creatinine, Ser 0.69 0.57 - 1.00 mg/dL   eGFR 101 >59 mL/min/1.73   BUN/Creatinine Ratio 16 9 - 23   Sodium 142 134 - 144 mmol/L   Potassium 4.6 3.5 - 5.2 mmol/L   Chloride 104 96 - 106 mmol/L   CO2 21 20 - 29 mmol/L   Calcium 9.2 8.7 - 10.2 mg/dL   Total Protein 7.3 6.0 - 8.5 g/dL   Albumin 4.6 3.8 - 4.9 g/dL   Globulin, Total 2.7 1.5 - 4.5 g/dL   Albumin/Globulin Ratio 1.7 1.2 - 2.2   Bilirubin Total 0.2 0.0 - 1.2 mg/dL   Alkaline Phosphatase 80 44 - 121 IU/L   AST 16 0 - 40 IU/L   ALT 19 0 - 32 IU/L  Lipid Panel w/o Chol/HDL Ratio  Result Value Ref Range   Cholesterol, Total 255 (H) 100 - 199 mg/dL   Triglycerides 114 0 - 149 mg/dL   HDL 113 >39 mg/dL   VLDL Cholesterol Cal 19 5 - 40 mg/dL   LDL Chol Calc (NIH) 123 (H) 0 - 99  mg/dL  TSH  Result Value Ref Range   TSH 4.140 0.450 - 4.500 uIU/mL  VITAMIN D 25 Hydroxy (Vit-D Deficiency, Fractures)  Result Value Ref Range   Vit D, 25-Hydroxy 33.3 30.0 - 100.0 ng/mL  Vitamin B12  Result Value Ref Range   Vitamin B-12 487 232 - 1,245 pg/mL  Iron, TIBC and Ferritin Panel  Result Value Ref Range   Total Iron Binding Capacity 390 250 - 450 ug/dL   UIBC 306 131 - 425 ug/dL   Iron 84 27 - 159 ug/dL   Iron Saturation 22 15 - 55 %   Ferritin 121 15 - 150 ng/mL      Assessment & Plan:   Problem List Items Addressed This Visit   None Visit Diagnoses     COVID    -  Primary   Tessalon sent for patient due to cough. Allergic to codeine. Will order chest xray to evaluate SOB. Continue with Alburterol PRN for symptoms.   Relevant Medications   benzonatate (TESSALON) 200 MG capsule   Other Relevant Orders   DG Chest 2 View   Shortness of breath       Will order chest xray to evaluate SOB. Will make further recommendations based on imaging results.   Relevant Orders   DG Chest 2 View        Follow up plan: Return if symptoms worsen or fail to improve.   This visit was completed via MyChart due to the restrictions of the COVID-19 pandemic. All issues as above were discussed and addressed. Physical exam was done as above through visual confirmation on MyChart. If it was felt that the patient should be evaluated in the office, they were directed there. The patient verbally consented to this visit. Location of the patient: Home Location of the provider: Office Those involved with this call:  Provider: Jon Billings, NP CMA: Tiffany Reel, CMA Front Desk/Registration: Jill Side This encounter was conducted via phone.  I spent 15 dedicated to the care of this patient on the date of this encounter to include previsit review of 21, time with  the patient, and post visit ordering of testing.

## 2020-09-16 NOTE — Telephone Encounter (Signed)
Pt has tested positive for covid. She has a fever, nasal congestion, chest congestion, cough, aches, chills, no loss of taste or smell, headache, diarrhea, and she  has vomited once.  Called patient to review sx. No answer, LVMTCB.

## 2020-09-19 ENCOUNTER — Telehealth: Payer: Self-pay

## 2020-09-19 NOTE — Progress Notes (Signed)
Please let patient know her chest xray is normal. If her SOB continues, we should have her come in for an appointment so someone can listen to her lungs.

## 2020-09-19 NOTE — Telephone Encounter (Signed)
Copied from CRM [id number]. Topic: General - Call Back - No Documentation >> Sep 19, 2020 10:33 AM Leafy Ro wrote: Reason for CRM: Pt is calling back and missed a call from office maybe concerning chest xray results

## 2020-09-19 NOTE — Telephone Encounter (Signed)
Results given to patient by RN.

## 2020-10-01 IMAGING — CR CHEST - 2 VIEW
1 series · 2 of 2 positions shown · non-contrast
Comparison: 06/01/2017

CLINICAL DATA: Preop clearance for knee replacement.

EXAM:
CHEST - 2 VIEW

[Series 1: dg chest 2 view · 0.14mm/px · 2 of 2 slices shown]
[im 1/2]
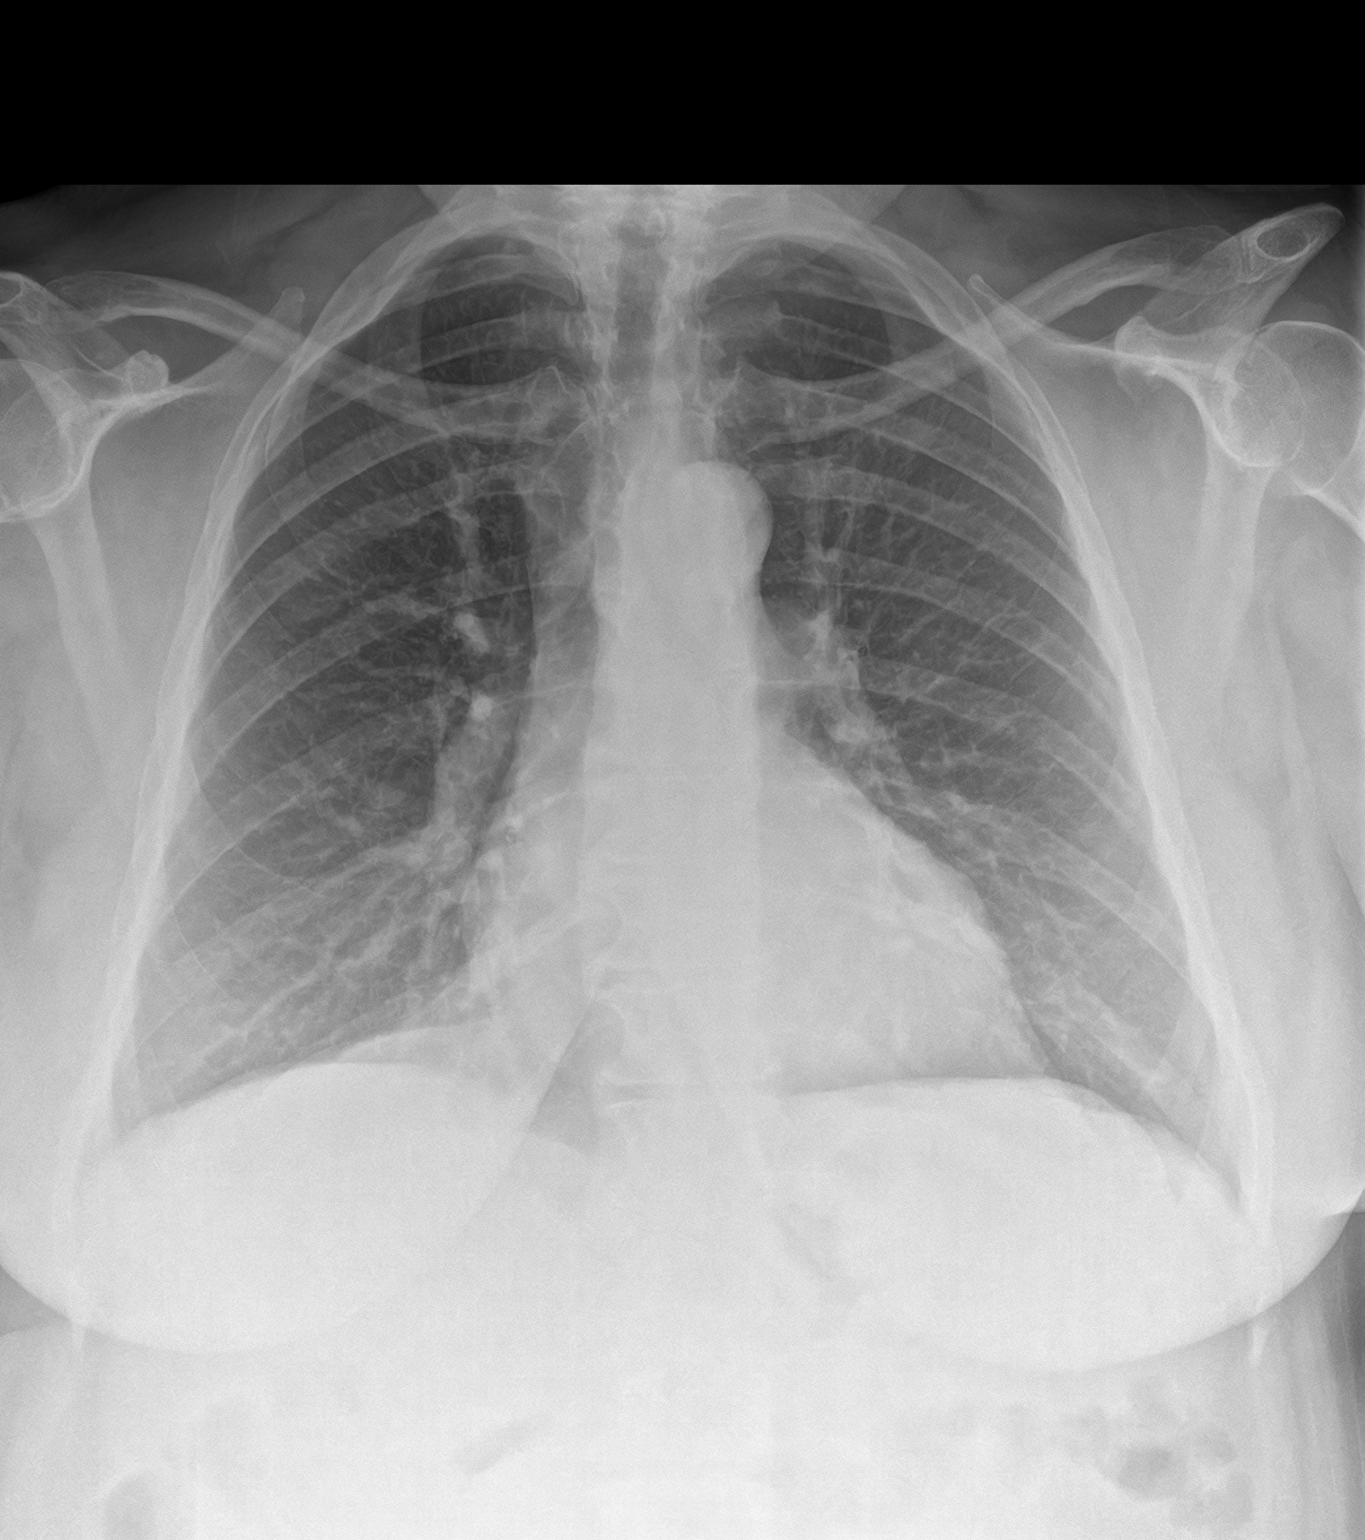
[im 2/2]
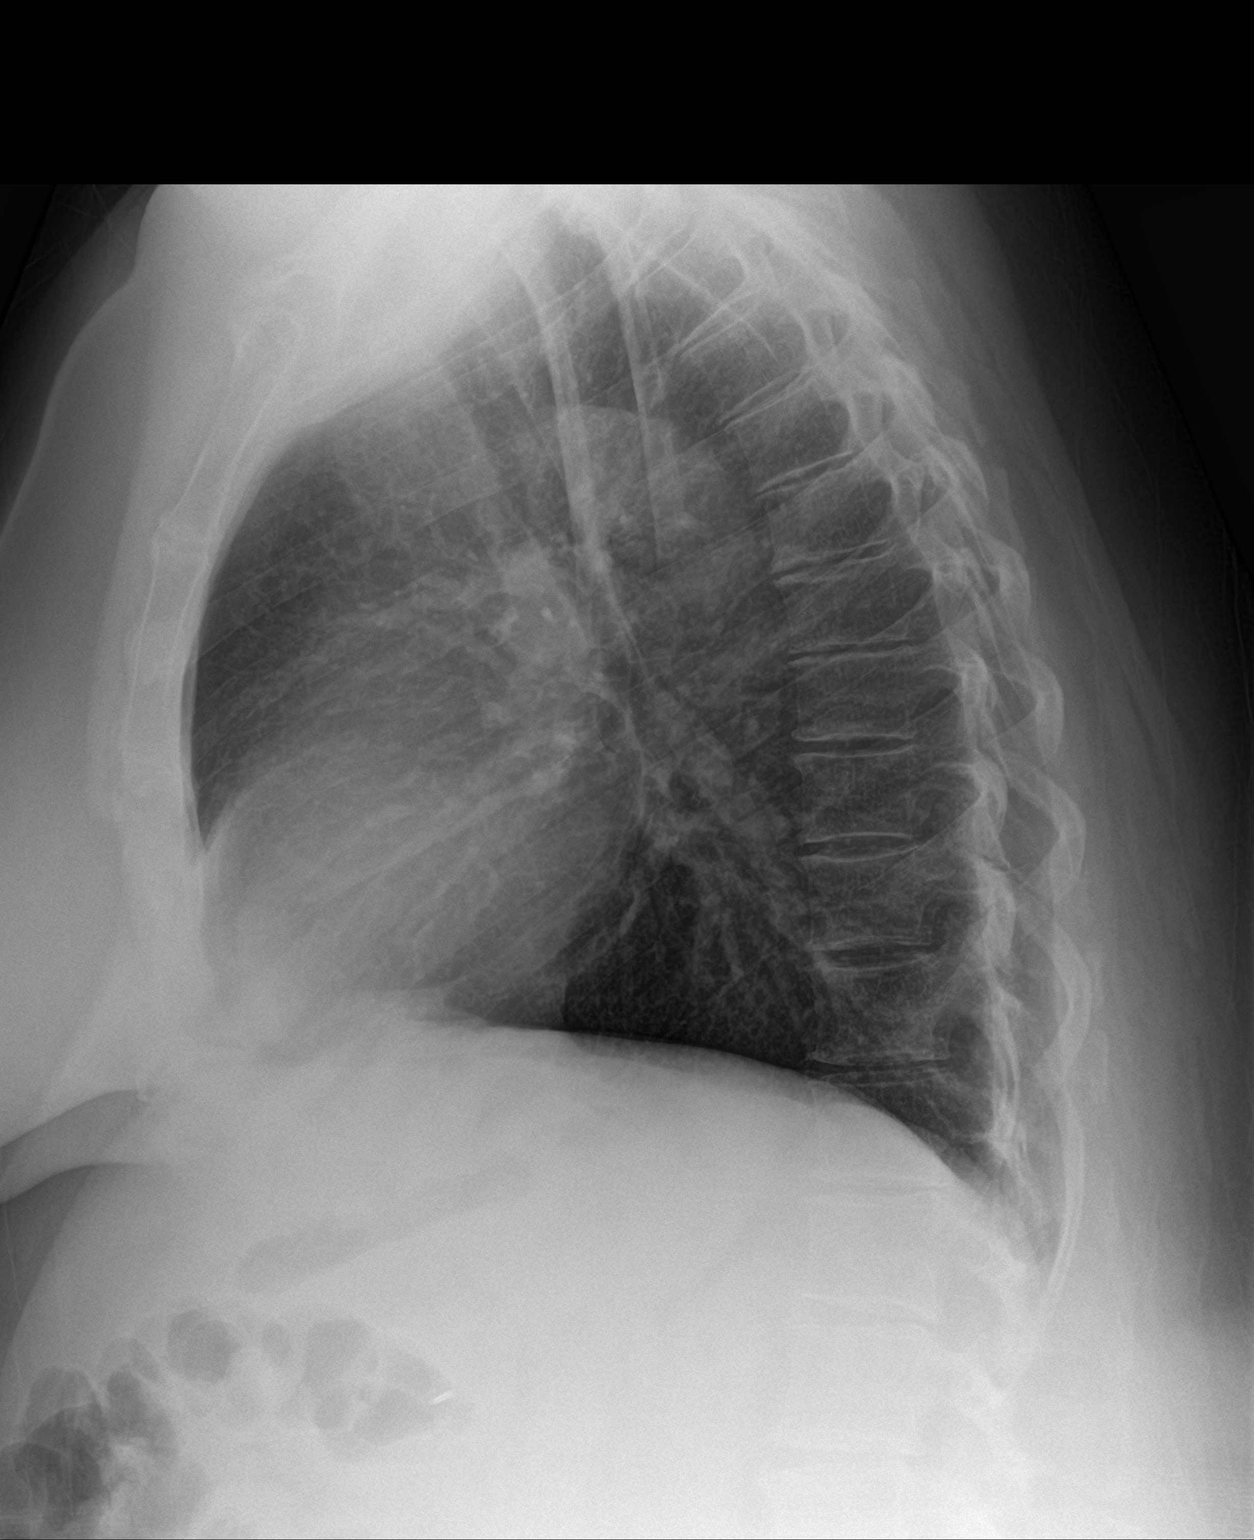

[2 of 2 positions shown; findings below may reference images not displayed]

FINDINGS: Lungs are adequately inflated and otherwise clear. Cardiomediastinal
silhouette and remainder of the exam is unchanged.
IMPRESSION: No active cardiopulmonary disease.

## 2020-10-25 ENCOUNTER — Telehealth: Payer: Self-pay

## 2020-10-25 NOTE — Telephone Encounter (Signed)
Called and LVM asking for patient to please return my call. Need to discuss her hysterectomy.

## 2021-01-06 ENCOUNTER — Other Ambulatory Visit: Payer: Self-pay | Admitting: Nurse Practitioner

## 2021-01-06 DIAGNOSIS — E039 Hypothyroidism, unspecified: Secondary | ICD-10-CM

## 2021-01-06 DIAGNOSIS — I10 Essential (primary) hypertension: Secondary | ICD-10-CM

## 2021-01-06 NOTE — Telephone Encounter (Signed)
Requested Prescriptions  Pending Prescriptions Disp Refills  . levothyroxine (SYNTHROID) 75 MCG tablet [Pharmacy Med Name: LEVOTHYROXINE 75 MCG TABLET] 90 tablet 1    Sig: TAKE 1 TABLET (75 MCG TOTAL) BY MOUTH DAILY BEFORE BREAKFAST.     Endocrinology:  Hypothyroid Agents Failed - 01/06/2021  2:42 AM      Failed - TSH needs to be rechecked within 3 months after an abnormal result. Refill until TSH is due.      Passed - TSH in normal range and within 360 days    TSH  Date Value Ref Range Status  06/29/2020 4.140 0.450 - 4.500 uIU/mL Final         Passed - Valid encounter within last 12 months    Recent Outpatient Visits          3 months ago COVID   [facility] [place], Clydie Braun, NP   4 months ago Alcohol use disorder, moderate, dependence (HCC)   [facility] [place], [place] T, NP   6 months ago Alcohol use disorder, moderate, dependence (HCC)   [facility] [place], [place] T, NP   11 months ago Current severe episode of major depressive disorder without psychotic features without prior episode (HCC)   [facility] Valentino Nose, NP   1 year ago Current severe episode of major depressive disorder without psychotic features without prior episode (HCC)   [facility] Cannady, Jolene T, NP             . amLODipine (NORVASC) 5 MG tablet [Pharmacy Med Name: AMLODIPINE BESYLATE 5 MG TAB] 90 tablet 0    Sig: TAKE 1 TABLET (5 MG TOTAL) BY MOUTH DAILY. NEED FOLLOW-UP FOR FURTHER REFILLS     Cardiovascular:  Calcium Channel Blockers Passed - 01/06/2021  2:42 AM      Passed - Last BP in normal range    BP Readings from Last 1 Encounters:  09/05/20 126/69         Passed - Valid encounter within last 6 months    Recent Outpatient Visits          3 months ago COVID   [facility] [place], Clydie Braun, NP   4 months ago Alcohol use disorder, moderate, dependence (HCC)   [facility] [place],  [place] T, NP   6 months ago Alcohol use disorder, moderate, dependence (HCC)   [facility] [place], [place] T, NP   11 months ago Current severe episode of major depressive disorder without psychotic features without prior episode (HCC)   [facility] Valentino Nose, NP   1 year ago Current severe episode of major depressive disorder without psychotic features without prior episode [facility])   [facility] [place], Dorie Rank, NP

## 2021-04-08 ENCOUNTER — Other Ambulatory Visit: Payer: Self-pay | Admitting: Nurse Practitioner

## 2021-04-08 DIAGNOSIS — I10 Essential (primary) hypertension: Secondary | ICD-10-CM

## 2021-04-10 NOTE — Telephone Encounter (Signed)
Called pt and LMOM to return call to set up appointment. Courtesy refill. Patient needs follow up appointment for further refills. Requested Prescriptions  Pending Prescriptions Disp Refills   amLODipine (NORVASC) 5 MG tablet [Pharmacy Med Name: AMLODIPINE BESYLATE 5 MG TAB] 90 tablet 0    Sig: TAKE 1 TABLET (5 MG TOTAL) BY MOUTH DAILY. NEED FOLLOW-UP FOR FURTHER REFILLS     Cardiovascular: Calcium Channel Blockers 2 Failed - 04/08/2021 10:01 AM      Failed - Valid encounter within last 6 months    Recent Outpatient Visits          6 months ago [place], Santiago Glad, NP   7 months ago Alcohol use disorder, moderate, dependence ([place])   [place], [place] T, NP   9 months ago Alcohol use disorder, moderate, dependence ([place])   [place], [place] T, NP   1 year ago Current severe episode of major depressive disorder without psychotic features without prior episode ([place])   [place] Eulogio Bear, NP   1 year ago Current severe episode of major depressive disorder without psychotic features without prior episode ([place])   [place] Cannady, Jolene T, NP             Passed - Last BP in normal range    BP Readings from Last 1 Encounters:  09/05/20 126/69         Passed - Last Heart Rate in normal range    Pulse Readings from Last 1 Encounters:  09/05/20 73

## 2021-05-13 ENCOUNTER — Other Ambulatory Visit: Payer: Self-pay | Admitting: Nurse Practitioner

## 2021-05-13 DIAGNOSIS — I10 Essential (primary) hypertension: Secondary | ICD-10-CM

## 2021-05-15 NOTE — Telephone Encounter (Signed)
Requested medications are due for refill today.  yes ? ?Requested medications are on the active medications list.  yes ? ?Last refill. 04/10/2021 #30 0 refills ? ?Future visit scheduled.   no ? ?Notes to clinic.  Courtesy refill already given. Pt called to make a follow up appointment - LMOM. ? ? ? ?Requested Prescriptions  ?Pending Prescriptions Disp Refills  ? amLODipine (NORVASC) 5 MG tablet [Pharmacy Med Name: AMLODIPINE BESYLATE 5 MG TAB] 30 tablet 0  ?  Sig: TAKE 1 TABLET (5 MG TOTAL) BY MOUTH DAILY. NEED FOLLOW-UP FOR FURTHER REFILLS  ?  ? Cardiovascular: Calcium Channel Blockers 2 Failed - 05/13/2021 10:31 AM  ?  ?  Failed - Valid encounter within last 6 months  ?  Recent Outpatient Visits   ? ?      ? 8 months ago COVID  ? [facility] Larae Grooms, NP  ? 8 months ago Alcohol use disorder, moderate, dependence (HCC)  ? [facility] [place], [place] T, NP  ? 11 months ago Alcohol use disorder, moderate, dependence (HCC)  ? [facility] [place], [place] T, NP  ? 1 year ago Current severe episode of major depressive disorder without psychotic features without prior episode (HCC)  ? [facility] Cathlean [place] A, NP  ? 1 year ago Current severe episode of major depressive disorder without psychotic features without prior episode (HCC)  ? [facility] [place], [place] T, NP  ? ?  ?  ? ?  ?  ?  Passed - Last BP in normal range  ?  BP Readings from Last 1 Encounters:  ?09/05/20 126/69  ?  ?  ?  ?  Passed - Last Heart Rate in normal range  ?  Pulse Readings from Last 1 Encounters:  ?09/05/20 73  ?  ?  ?  ?  ?  ?

## 2021-05-15 NOTE — Telephone Encounter (Signed)
Called pt to schedule follow up appointment - LMOM. ?

## 2021-05-16 NOTE — Telephone Encounter (Signed)
Lmom asking pt to call back to schedule an appt. °

## 2021-05-17 NOTE — Telephone Encounter (Signed)
2nd attempt-Lmom asking pt to call back to schedule an appt. °

## 2021-05-18 NOTE — Telephone Encounter (Signed)
Pt scheduled 4/10 and states she has enough medication to last her until then ?

## 2021-06-03 NOTE — Patient Instructions (Signed)

## 2021-06-05 ENCOUNTER — Ambulatory Visit: Payer: BC Managed Care – PPO | Admitting: Nurse Practitioner

## 2021-06-05 ENCOUNTER — Encounter: Payer: Self-pay | Admitting: Nurse Practitioner

## 2021-06-05 DIAGNOSIS — I10 Essential (primary) hypertension: Secondary | ICD-10-CM

## 2021-06-05 DIAGNOSIS — E039 Hypothyroidism, unspecified: Secondary | ICD-10-CM

## 2021-06-05 DIAGNOSIS — E559 Vitamin D deficiency, unspecified: Secondary | ICD-10-CM

## 2021-06-05 DIAGNOSIS — F322 Major depressive disorder, single episode, severe without psychotic features: Secondary | ICD-10-CM

## 2021-06-05 DIAGNOSIS — F1020 Alcohol dependence, uncomplicated: Secondary | ICD-10-CM | POA: Diagnosis not present

## 2021-06-05 DIAGNOSIS — E782 Mixed hyperlipidemia: Secondary | ICD-10-CM

## 2021-06-05 DIAGNOSIS — R8281 Pyuria: Secondary | ICD-10-CM

## 2021-06-05 DIAGNOSIS — G4733 Obstructive sleep apnea (adult) (pediatric): Secondary | ICD-10-CM

## 2021-06-05 DIAGNOSIS — R7309 Other abnormal glucose: Secondary | ICD-10-CM

## 2021-06-05 LAB — MICROALBUMIN, URINE WAIVED
Creatinine, Urine Waived: 200 mg/dL (ref 10–300)
Microalb, Ur Waived: 80 mg/L — ABNORMAL HIGH (ref 0–19)

## 2021-06-05 LAB — MICROSCOPIC EXAMINATION

## 2021-06-05 LAB — URINALYSIS, ROUTINE W REFLEX MICROSCOPIC
Bilirubin, UA: NEGATIVE
Glucose, UA: NEGATIVE
Nitrite, UA: NEGATIVE
Specific Gravity, UA: 1.025 (ref 1.005–1.030)
Urobilinogen, Ur: 0.2 mg/dL (ref 0.2–1.0)
pH, UA: 5.5 (ref 5.0–7.5)

## 2021-06-05 LAB — BAYER DCA HB A1C WAIVED: HB A1C (BAYER DCA - WAIVED): 5.4 % (ref 4.8–5.6)

## 2021-06-05 MED ORDER — LEVOTHYROXINE SODIUM 75 MCG PO TABS: 75.0000 ug | ORAL_TABLET | Freq: Every day | ORAL | 4 refills | Status: DC

## 2021-06-05 MED ORDER — ATORVASTATIN CALCIUM 20 MG PO TABS: 20.0000 mg | ORAL_TABLET | Freq: Every day | ORAL | 4 refills | Status: DC

## 2021-06-05 MED ORDER — AMLODIPINE BESYLATE 5 MG PO TABS: 5.0000 mg | ORAL_TABLET | Freq: Every day | ORAL | 4 refills | Status: DC

## 2021-06-05 NOTE — Assessment & Plan Note (Signed)
Chronic, stable.  Follows with psychiatrist and currently taking Sertraline 200 mg daily.  Continue this collaboration and current regimen.  Recommend continued cut back of alcohol use.  Continue AA meetings. ?

## 2021-06-05 NOTE — Assessment & Plan Note (Signed)
BMI 45.50 with HTN.  Recommended eating smaller high protein, low fat meals more frequently and exercising 30 mins a day 5 times a week with a goal of 10-15lb weight loss in the next 3 months. Patient voiced their understanding and motivation to adhere to these recommendations. ? ?

## 2021-06-05 NOTE — Assessment & Plan Note (Signed)
Chronic, ongoing.  Continue current medication regimen, adjust as needed.  Check TSH and Free T4 outpatient -- could consider thyroid ultrasound in future.   ?

## 2021-06-05 NOTE — Addendum Note (Signed)
Addended by: Aura Dials T on: 06/05/2021 03:43 PM ? ? Modules accepted: Orders ? ?

## 2021-06-05 NOTE — Assessment & Plan Note (Signed)
Chronic, ongoing.  Continue current medication regimen and adjust as needed. Lipid panel today. 

## 2021-06-05 NOTE — Assessment & Plan Note (Signed)
Chronic, ongoing.  Continue daily supplement and recheck level, adjust as needed. ?

## 2021-06-05 NOTE — Progress Notes (Signed)
? ?BP 129/73   Pulse 91   Temp 98.1 ?F (36.7 ?C) (Oral)   Wt 252 lb 12.8 oz (114.7 kg)   LMP 12/24/2012 (Exact Date)   SpO2 95%   BMI 45.50 kg/m?   ? ?Subjective:  ? ? Patient ID: Jessica Brennan, female    DOB: December 22, 1965, 56 y.o.   MRN: [id number] ? ?HPI: ?Jessica Brennan is a 56 y.o. female ? ?Chief Complaint  ?Patient presents with  ? Diabetes  ? Hyperlipidemia  ? Hypertension  ? Hypothyroidism  ? Medication Refill  ?  Patient is requesting refill on her medication.   ? ?HYPERTENSION / HYPERLIPIDEMIA ?Currently taking Norvasc and Lipitor.  Has history of elevated glucose, last A1c in May 2022 was 5.8%. She also has Vitamin D deficiency, taking supplement.  No recent falls or fractures. ? ?She reports her sister worries because patient gets out of breath going for long walks with her, but sister is tall and walks fast, which patient can not do.  Patient denies getting short of breath any other time and can swim for an hour without SOB.   ?Satisfied with current treatment? yes ?Duration of hypertension: chronic ?BP monitoring frequency: not checking ?BP range:  ?BP medication side effects: no ?Duration of hyperlipidemia: chronic ?Cholesterol medication side effects: no ?Cholesterol supplements: none ?Medication compliance: good compliance ?Aspirin: no ?Recent stressors: no ?Recurrent headaches: no ?Visual changes: no ?Palpitations: no ?Dyspnea: no ?Chest pain: no ?Lower extremity edema: yes, at baseline ?Dizzy/lightheaded: no  ?  ?HYPOTHYROIDISM ?Currently on Levothyroxine 75 MCG daily.  ?Thyroid control status:stable ?Satisfied with current treatment? yes ?Medication side effects: no ?Medication compliance: good compliance ?Etiology of hypothyroidism: unknown ?Recent dose adjustment:no ?Fatigue: yes, reports at baseline due to mood ?Cold intolerance: no ?Heat intolerance: no ?Weight gain: no ?Weight loss: no ?Constipation: no ?Diarrhea/loose stools: yes, occasional  ?Palpitations: no ?Lower extremity  edema: none ?Anxiety/depressed mood: yes, her baseline ?  ?DEPRESSION ?Is going to psychiatry, Dr. Maryruth Bun -- last visit one month ago per patient, last note in chart from December 2022.  Reports long standing struggle with depression/anxiety.  Currently taking Zoloft 200 MG daily. ? ?Has alcohol abuse issues which was discussed at visits July 2022.  She reports Naltrexone was not good for her, made her sleepy so reduced alcohol use due to her sleeping more.  Currently drinking only on weekends -- 4 beers Friday and 4 Saturday.  Continues to go to [employer], does have sponsor there.   Reports she has drank like this for 4 years -- both parents are alcoholics.   ?Mood status: exacerbated ?Satisfied with current treatment?: no ?Symptom severity: moderate  ?Duration of current treatment : chronic ?Side effects: no ?Medication compliance: good compliance ?Psychotherapy/counseling: yes in the past, not at present due to cost ?Previous psychiatric medications: effexor ?Depressed mood: yes ?Anxious mood: yes ?Anhedonia: no ?Significant weight loss or gain: no ?Insomnia: none ?Fatigue: yes ?Feelings of worthlessness or guilt: yes ?Impaired concentration/indecisiveness: yes ?Suicidal ideations: none ?Hopelessness: yes ?Crying spells: yes ? ?  06/05/2021  ?  2:38 PM 09/05/2020  ? 10:33 AM 06/17/2020  ? 11:38 AM 10/26/2019  ?  1:55 PM 05/14/2018  ?  9:08 AM  ?Depression screen PHQ 2/9  ?Decreased Interest 2 3 0 2 3  ?Down, Depressed, Hopeless 2 2 0 1 2  ?PHQ - 2 Score 4 5 0 3 5  ?Altered sleeping 1 0 0 0 0  ?Tired, decreased energy 2 2 1 1 3   ?Change in  appetite 1 1 0 1 2  ?Feeling bad or failure about yourself  1 2 0 2 3  ?Trouble concentrating 0 0 0 0 3  ?Moving slowly or fidgety/restless 0 0 0 0 1  ?Suicidal thoughts 0 2 0 0 3  ?PHQ-9 Score 9 12 1 7 20   ?Difficult doing work/chores  Very difficult Extremely dIfficult Not difficult at all Very difficult  ?  ? ?  06/05/2021  ?  2:38 PM 09/05/2020  ? 10:35 AM 10/26/2019  ?  1:55 PM  10/17/2017  ? 11:32 AM  ?GAD 7 : Generalized Anxiety Score  ?Nervous, Anxious, on Edge 2 3 0 3  ?Control/stop worrying 2 2 0 3  ?Worry too much - different things 1 2 0 3  ?Trouble relaxing 0 0 0 1  ?Restless 0 0 0 0  ?Easily annoyed or irritable 0 0 0 0  ?Afraid - awful might happen 1 2 0 0  ?Total GAD 7 Score 6 9 0 10  ?Anxiety Difficulty Somewhat difficult Somewhat difficult Not difficult at all Somewhat difficult  ? ?Relevant past medical, surgical, family and social history reviewed and updated as indicated. Interim medical history since our last visit reviewed. ?Allergies and medications reviewed and updated. ? ?Review of Systems  ?Constitutional:  Negative for activity change, appetite change, diaphoresis, fatigue and fever.  ?Respiratory:  Negative for cough, chest tightness and shortness of breath.   ?Cardiovascular:  Negative for chest pain, palpitations and leg swelling.  ?Gastrointestinal: Negative.   ?Endocrine: Negative for cold intolerance, heat intolerance, polydipsia, polyphagia and polyuria.  ?Neurological: Negative.   ?Psychiatric/Behavioral: Negative.    ? ?Per HPI unless specifically indicated above ? ?   ?Objective:  ?  ?BP 129/73   Pulse 91   Temp 98.1 ?F (36.7 ?C) (Oral)   Wt 252 lb 12.8 oz (114.7 kg)   LMP 12/24/2012 (Exact Date)   SpO2 95%   BMI 45.50 kg/m?   ?Wt Readings from Last 3 Encounters:  ?06/05/21 252 lb 12.8 oz (114.7 kg)  ?09/05/20 246 lb (111.6 kg)  ?06/17/20 237 lb (107.5 kg)  ?  ?Physical Exam ?Vitals and nursing note reviewed.  ?Constitutional:   ?   General: She is awake. She is not in acute distress. ?   Appearance: She is well-developed and well-groomed. She is obese. She is not ill-appearing or toxic-appearing.  ?HENT:  ?   Head: Normocephalic.  ?   Right Ear: Hearing normal.  ?   Left Ear: Hearing normal.  ?   Nose: Nose normal.  ?   Mouth/Throat:  ?   Mouth: Mucous membranes are moist.  ?Eyes:  ?   General: Lids are normal.     ?   Right eye: No discharge.     ?    Left eye: No discharge.  ?   Conjunctiva/sclera: Conjunctivae normal.  ?   Pupils: Pupils are equal, round, and reactive to light.  ?Neck:  ?   Thyroid: No thyromegaly.  ?   Vascular: No carotid bruit or JVD.  ?Cardiovascular:  ?   Rate and Rhythm: Normal rate and regular rhythm.  ?   Heart sounds: Normal heart sounds. No murmur heard. ?  No gallop.  ?Pulmonary:  ?   Effort: Pulmonary effort is normal. No accessory muscle usage or respiratory distress.  ?   Breath sounds: Normal breath sounds.  ?Abdominal:  ?   General: Bowel sounds are normal. There is no distension.  ?  Palpations: Abdomen is soft.  ?   Tenderness: There is no abdominal tenderness.  ?Musculoskeletal:  ?   Cervical back: Normal range of motion and neck supple.  ?   Right lower leg: No edema.  ?   Left lower leg: No edema.  ?Lymphadenopathy:  ?   Cervical: No cervical adenopathy.  ?Skin: ?   General: Skin is warm and dry.  ?Neurological:  ?   Mental Status: She is alert and oriented to person, place, and time.  ?Psychiatric:     ?   Attention and Perception: Attention normal.     ?   Mood and Affect: Mood normal.     ?   Speech: Speech normal.     ?   Behavior: Behavior normal. Behavior is cooperative.     ?   Thought Content: Thought content normal.  ? ?Results for orders placed or performed in visit on 06/29/20  ?Microalbumin, Urine Waived  ?Result Value Ref Range  ? Microalb, Ur Waived 10 0 - 19 mg/L  ? Creatinine, Urine Waived 200 10 - 300 mg/dL  ? Microalb/Creat Ratio <30 <30 mg/g  ?HgB A1c  ?Result Value Ref Range  ? Hgb A1c MFr Bld 5.8 (H) 4.8 - 5.6 %  ? Est. average glucose Bld gHb Est-mCnc 120 mg/dL  ?Thyroid peroxidase antibody  ?Result Value Ref Range  ? Thyroperoxidase Ab SerPl-aCnc <8 0 - 34 IU/mL  ?Folate  ?Result Value Ref Range  ? Folate 4.7 >3.0 ng/mL  ?Gamma GT  ?Result Value Ref Range  ? GGT 13 0 - 60 IU/L  ?CBC with Differential/Platelet  ?Result Value Ref Range  ? WBC 11.9 (H) 3.4 - 10.8 x10E3/uL  ? RBC 4.89 3.77 - 5.28  x10E6/uL  ? Hemoglobin 14.5 11.1 - 15.9 g/dL  ? Hematocrit 43.4 34.0 - 46.6 %  ? MCV 89 79 - 97 fL  ? MCH 29.7 26.6 - 33.0 pg  ? MCHC 33.4 31.5 - 35.7 g/dL  ? RDW 13.1 11.7 - 15.4 %  ? Platelets 249 150 - 450 x10E3/uL  ? Neu

## 2021-06-05 NOTE — Addendum Note (Signed)
Addended by: Aura Dials T on: 06/05/2021 04:13 PM ? ? Modules accepted: Orders ? ?

## 2021-06-05 NOTE — Assessment & Plan Note (Addendum)
Chronic, ongoing.  BP at goal today.  Recommend she monitor BP at least a few mornings a week at home and document.  DASH diet at home.  Continue current medication regimen and adjust as needed.  Labs today: CMP, urine ALB.  Return in 6 months. ? ?

## 2021-06-05 NOTE — Assessment & Plan Note (Signed)
Improving, patient AUDIT = 3 last visit and is cutting back.  Has been drinking heavier, 12 beers a night, for 3 years and family history of alcohol use disorder in both parents.  Recommend continued cut back of alcohol use.  Did not tolerate Naltrexone.  Continue AA meetings.  Return in 6 months for follow-up. ?

## 2021-06-05 NOTE — Assessment & Plan Note (Signed)
A1c 5.4% today, much improved.  Praised for this. ?

## 2021-06-06 LAB — LIPID PANEL W/O CHOL/HDL RATIO
Cholesterol, Total: 268 mg/dL — ABNORMAL HIGH (ref 100–199)
HDL: 122 mg/dL (ref >39)
LDL Chol Calc (NIH): 133 mg/dL — ABNORMAL HIGH (ref 0–99)
Triglycerides: 81 mg/dL (ref 0–149)
VLDL Cholesterol Cal: 13 mg/dL (ref 5–40)

## 2021-06-06 LAB — COMPREHENSIVE METABOLIC PANEL
ALT: 22 IU/L (ref 0–32)
AST: 18 IU/L (ref 0–40)
Albumin/Globulin Ratio: 1.7 (ref 1.2–2.2)
Albumin: 4.5 g/dL (ref 3.8–4.9)
Alkaline Phosphatase: 84 IU/L (ref 44–121)
BUN/Creatinine Ratio: 18 (ref 9–23)
BUN: 16 mg/dL (ref 6–24)
Bilirubin Total: 0.2 mg/dL (ref 0.0–1.2)
CO2: 25 mmol/L (ref 20–29)
Calcium: 9.1 mg/dL (ref 8.7–10.2)
Chloride: 104 mmol/L (ref 96–106)
Creatinine, Ser: 0.87 mg/dL (ref 0.57–1.00)
Globulin, Total: 2.6 g/dL (ref 1.5–4.5)
Glucose: 116 mg/dL — ABNORMAL HIGH (ref 70–99)
Potassium: 4.7 mmol/L (ref 3.5–5.2)
Sodium: 142 mmol/L (ref 134–144)
Total Protein: 7.1 g/dL (ref 6.0–8.5)
eGFR: 77 mL/min/{1.73_m2} (ref >59)

## 2021-06-06 LAB — T4, FREE: Free T4: 1.09 ng/dL (ref 0.82–1.77)

## 2021-06-06 LAB — VITAMIN D 25 HYDROXY (VIT D DEFICIENCY, FRACTURES): Vit D, 25-Hydroxy: 36.9 ng/mL (ref 30.0–100.0)

## 2021-06-06 LAB — TSH: TSH: 3.53 u[IU]/mL (ref 0.450–4.500)

## 2021-06-06 NOTE — Progress Notes (Signed)
Good morning, please let Davy know her labs have returned: ?- Kidney function, creatinine and eGFR, remains normal, as is liver function, AST and ALT.   ?- Thyroid level remains stable, continue Levothyroxine dosing. ?- Vitamin D level is stable, continue supplement. ?- Cholesterol levels remain elevated, are you taking Atorvastatin daily?  If not please ensure you do.  If you are taking daily, I would recommend increasing Atorvastatin to 40 MG daily and stopping 20 MG, then recheck levels next visit.   ?- Urine is showing possible infection present, I have sent for culture and if returns showing infection, then I will send in appropriate treatment.  Any questions? ?Keep being amazing!!  Thank you for allowing me to participate in your care.  I appreciate you. ?Kindest regards, ?Marytza Grandpre ? ?

## 2021-06-07 LAB — URINE CULTURE

## 2021-06-07 NOTE — Progress Notes (Signed)
Please let Jessica Brennan know her urine returned showing no infection to treat at this time.  Great news!! ?Keep being amazing!!  Thank you for allowing me to participate in your care.  I appreciate you. ?Kindest regards, ?Jillyn Stacey ?

## 2021-10-26 ENCOUNTER — Telehealth: Payer: Self-pay

## 2021-10-26 NOTE — Telephone Encounter (Signed)
Called and LVM asking for patient to please return my call. Patient is overdue for her mammogram. Need to find out if patient is interested in having this ordered and scheduled for her.   OK for PEC to speak with the patient and find out the above information if the patient calls back.

## 2021-12-06 ENCOUNTER — Ambulatory Visit: Payer: BC Managed Care – PPO | Admitting: Nurse Practitioner

## 2021-12-17 NOTE — Patient Instructions (Signed)
Please call to schedule your mammogram and/or bone density: [facility] at [place]: [address], [place], [place] [address] Phone: [phone]  [place] at [facility] [address] [place],  [place]  [address] Phone: [phone]    Managing Depression, Adult Depression is a mental health condition that affects your thoughts, feelings, and actions. Being diagnosed with depression can bring you relief if you did not know why you have felt or behaved a certain way. It could also leave you feeling overwhelmed with uncertainty about your future. Preparing yourself to manage your symptoms can help you feel more positive about your future. How to manage lifestyle changes Managing stress  Stress is your body's reaction to life changes and events, both good and bad. Stress can add to your feelings of depression. Learning to manage your stress can help lessen your feelings of depression. Try some of the following approaches to reducing your stress (stress reduction techniques): Listen to music that you enjoy and that inspires you. Try using a meditation app or take a meditation class. Develop a practice that helps you connect with your spiritual self. Walk in nature, pray, or go to a place of worship. Do some deep breathing. To do this, inhale slowly through your nose. Pause at the top of your inhale for a few seconds and then exhale slowly, letting your muscles relax. Practice yoga to help relax and work your muscles. Choose a stress reduction technique that suits your lifestyle and personality. These techniques take time and practice to develop. Set aside 5-15 minutes a day to do them. Therapists can offer training in these techniques. Other things you can do to manage stress include: Keeping a stress diary. Knowing your limits and saying no when you think something is too much. Paying attention to how you react to certain  situations. You may not be able to control everything, but you can change your reaction. Adding humor to your life by watching funny films or TV shows. Making time for activities that you enjoy and that relax you.  Medicines Medicines, such as antidepressants, are often a part of treatment for depression. Talk with your pharmacist or health care provider about all the medicines, supplements, and herbal products that you take, their possible side effects, and what medicines and other products are safe to take together. Make sure to report any side effects you may have to your health care provider. Relationships Your health care provider may suggest family therapy, couples therapy, or individual therapy as part of your treatment. How to recognize changes Everyone responds differently to treatment for depression. As you recover from depression, you may start to: Have more interest in doing activities. Feel less hopeless. Have more energy. Overeat less often, or have a better appetite. Have better mental focus. It is important to recognize if your depression is not getting better or is getting worse. The symptoms you had in the beginning may return, such as: Tiredness (fatigue) or low energy. Eating too much or too little. Sleeping too much or too little. Feeling restless, agitated, or hopeless. Trouble focusing or making decisions. Unexplained physical complaints. Feeling irritable, angry, or aggressive. If you or your family members notice these symptoms coming back, let your health care provider know right away. Follow these instructions at home: Activity  Try to get some form of exercise each day, such as walking, biking, swimming, or lifting weights. Practice stress reduction techniques. Engage your mind by  taking a class or doing some volunteer work. Lifestyle Get the right amount and quality of sleep. Cut down on using caffeine, tobacco, alcohol, and other potentially harmful  substances. Eat a healthy diet that includes plenty of vegetables, fruits, whole grains, low-fat dairy products, and lean protein. Do not eat a lot of foods that are high in solid fats, added sugars, or salt (sodium). General instructions Take over-the-counter and prescription medicines only as told by your health care provider. Keep all follow-up visits as told by your health care provider. This is important. Where to find support Talking to others  Friends and family members can be sources of support and guidance. Talk to trusted friends or family members about your condition. Explain your symptoms to them, and let them know that you are working with a health care provider to treat your depression. Tell friends and family members how they also can be helpful. Finances Find appropriate mental health providers that fit with your financial situation. Talk with your health care provider about options to get reduced prices on your medicines. Where to find more information You can find support in your area from: Anxiety and Depression Association of America (ADAA): www.adaa.org Mental Health America: www.mentalhealthamerica.net [employer] on Mental Illness: www.nami.org Contact a health care provider if: You stop taking your antidepressant medicines, and you have any of these symptoms: Nausea. Headache. Light-headedness. Chills and body aches. Not being able to sleep (insomnia). You or your friends and family think your depression is getting worse. Get help right away if: You have thoughts of hurting yourself or others. If you ever feel like you may hurt yourself or others, or have thoughts about taking your own life, get help right away. Go to your nearest emergency department or: Call your local emergency services (911 in the [place] Call a suicide crisis helpline, such as the National Suicide Prevention Lifeline at 603-190-9739 or 988 in the [place] This is open 24 hours a day in the  [place] Text the Crisis Text Line at (765)428-3741 (in the [place] Summary If you are diagnosed with depression, preparing yourself to manage your symptoms is a good way to feel positive about your future. Work with your health care provider on a management plan that includes stress reduction techniques, medicines (if applicable), therapy, and healthy lifestyle habits. Keep talking with your health care provider about how your treatment is working. If you have thoughts about taking your own life, call a suicide crisis helpline or text a crisis text line. This information is not intended to replace advice given to you by your health care provider. Make sure you discuss any questions you have with your health care provider. Document Revised: 09/07/2020 Document Reviewed: 12/24/2018 Elsevier Patient Education  2023 [employer].

## 2021-12-19 ENCOUNTER — Encounter: Payer: Self-pay | Admitting: Nurse Practitioner

## 2021-12-19 ENCOUNTER — Ambulatory Visit: Payer: BC Managed Care – PPO | Admitting: Nurse Practitioner

## 2021-12-19 VITALS — BP 107/66 | HR 80 | Temp 98.0°F | Ht 62.5 in | Wt 260.4 lb

## 2021-12-19 DIAGNOSIS — I10 Essential (primary) hypertension: Secondary | ICD-10-CM

## 2021-12-19 DIAGNOSIS — F1020 Alcohol dependence, uncomplicated: Secondary | ICD-10-CM

## 2021-12-19 DIAGNOSIS — E559 Vitamin D deficiency, unspecified: Secondary | ICD-10-CM

## 2021-12-19 DIAGNOSIS — E039 Hypothyroidism, unspecified: Secondary | ICD-10-CM

## 2021-12-19 DIAGNOSIS — M7989 Other specified soft tissue disorders: Secondary | ICD-10-CM

## 2021-12-19 DIAGNOSIS — G4733 Obstructive sleep apnea (adult) (pediatric): Secondary | ICD-10-CM

## 2021-12-19 DIAGNOSIS — F322 Major depressive disorder, single episode, severe without psychotic features: Secondary | ICD-10-CM

## 2021-12-19 DIAGNOSIS — R7309 Other abnormal glucose: Secondary | ICD-10-CM

## 2021-12-19 DIAGNOSIS — E782 Mixed hyperlipidemia: Secondary | ICD-10-CM

## 2021-12-19 DIAGNOSIS — Z1231 Encounter for screening mammogram for malignant neoplasm of breast: Secondary | ICD-10-CM

## 2021-12-19 DIAGNOSIS — Z23 Encounter for immunization: Secondary | ICD-10-CM

## 2021-12-19 MED ORDER — VALSARTAN 40 MG PO TABS: 40.0000 mg | ORAL_TABLET | Freq: Every day | ORAL | 4 refills | Status: DC

## 2021-12-19 NOTE — Progress Notes (Signed)
BP 107/66   Pulse 80   Temp 98 F (36.7 C) (Oral)   Ht 5' 2.5" (1.588 m)   Wt 260 lb 6.4 oz (118.1 kg)   LMP 12/24/2012 (Exact Date)   SpO2 98%   BMI 46.87 kg/m    Subjective:    Patient ID: Jessica Brennan, female    DOB: [birth year], 56 y.o.   MRN: [id number]  HPI: Jessica Brennan is a 56 y.o. female  Chief Complaint  Patient presents with   Hyperlipidemia   Hypertension   Hypothyroidism   Leg Swelling    Pt states she has been having bilateral leg swelling for the last 2 weeks   HYPERTENSION / HYPERLIPIDEMIA Continues on Amlodipine and Atorvastatin.  Does endorse some increased swelling to lower legs for past two weeks, was worse, but has improved some with weight loss.  No pain with increased edema.  Has been on feet more recently.  Does not wear compression hose.  At baseline has had swelling to left leg due to past left ankle surgery.    Has history of elevated glucose, last A1c 5.4% in April. Has Vitamin D deficiency, taking supplement -- her ortho told her to remain on this.  No recent falls or fractures. Satisfied with current treatment? yes Duration of hypertension: chronic BP monitoring frequency: not checking BP range:  BP medication side effects: no Duration of hyperlipidemia: chronic Cholesterol medication side effects: no Cholesterol supplements: none Medication compliance: good compliance Aspirin: no Recent stressors: no Recurrent headaches: no Visual changes: no Palpitations: no Dyspnea: occasional with activity, is trying to work on exercise Chest pain: no Lower extremity edema: yes Dizzy/lightheaded: no    HYPOTHYROIDISM Currently on Levothyroxine 75 MCG daily.  Thyroid control status:stable Satisfied with current treatment? yes Medication side effects: no Medication compliance: good compliance Etiology of hypothyroidism: unknown Recent dose adjustment:no Fatigue: yes, occasional Cold intolerance: no Heat intolerance: no Weight gain:  no Weight loss: no Constipation: no Diarrhea/loose stools: no Palpitations: no Lower extremity edema: yes Anxiety/depressed mood: yes, at baseline   DEPRESSION Is going to psychiatry, Dr. Nicolasa Ducking -- last visit 12/04/21.  Reports long standing struggle with depression/anxiety.  Currently taking Zoloft 200 MG daily -- this was increase.  Has alcohol abuse issues, not current alcohol use -- last drink July 16th, 2023.  Continues to go to [employer], does have sponsor there.   Reports she has drank like this for 4 years -- both parents are alcoholics.   Mood status: exacerbated Satisfied with current treatment?: no Symptom severity: moderate  Duration of current treatment : chronic Side effects: no Medication compliance: good compliance Psychotherapy/counseling: yes, current Previous psychiatric medications: effexor Depressed mood: yes Anxious mood: yes Anhedonia: no Significant weight loss or gain: no Insomnia: none Fatigue: yes Feelings of worthlessness or guilt: yes Impaired concentration/indecisiveness: yes Suicidal ideations: none Hopelessness: yes Crying spells: yes    12/19/2021   10:19 AM 06/05/2021    2:38 PM 09/05/2020   10:33 AM 06/17/2020   11:38 AM 10/26/2019    1:55 PM  Depression screen PHQ 2/9  Decreased Interest '1 2 3 ' 0 2  Down, Depressed, Hopeless '1 2 2 ' 0 1  PHQ - 2 Score '2 4 5 ' 0 3  Altered sleeping 0 1 0 0 0  Tired, decreased energy '1 2 2 1 1  ' Change in appetite 0 1 1 0 1  Feeling bad or failure about yourself  '1 1 2 ' 0 2  Trouble concentrating 0 0 0  0 0  Moving slowly or fidgety/restless 0 0 0 0 0  Suicidal thoughts 0 0 2 0 0  PHQ-9 Score '4 9 12 1 7  ' Difficult doing work/chores Not difficult at all  Very difficult Extremely dIfficult Not difficult at all       12/19/2021   10:20 AM 06/05/2021    2:38 PM 09/05/2020   10:35 AM 10/26/2019    1:55 PM  GAD 7 : Generalized Anxiety Score  Nervous, Anxious, on Edge '2 2 3 ' 0  Control/stop worrying '2 2 2 ' 0   Worry too much - different things '1 1 2 ' 0  Trouble relaxing 0 0 0 0  Restless 0 0 0 0  Easily annoyed or irritable 0 0 0 0  Afraid - awful might happen 0 1 2 0  Total GAD 7 Score '5 6 9 ' 0  Anxiety Difficulty Not difficult at all Somewhat difficult Somewhat difficult Not difficult at all   Relevant past medical, surgical, family and social history reviewed and updated as indicated. Interim medical history since our last visit reviewed. Allergies and medications reviewed and updated.  Review of Systems  Constitutional:  Negative for activity change, appetite change, diaphoresis, fatigue and fever.  Respiratory:  Negative for cough, chest tightness and shortness of breath.   Cardiovascular:  Negative for chest pain, palpitations and leg swelling.  Gastrointestinal: Negative.   Endocrine: Negative for cold intolerance, heat intolerance, polydipsia, polyphagia and polyuria.  Neurological: Negative.   Psychiatric/Behavioral: Negative.     Per HPI unless specifically indicated above     Objective:    BP 107/66   Pulse 80   Temp 98 F (36.7 C) (Oral)   Ht 5' 2.5" (1.588 m)   Wt 260 lb 6.4 oz (118.1 kg)   LMP 12/24/2012 (Exact Date)   SpO2 98%   BMI 46.87 kg/m   Wt Readings from Last 3 Encounters:  12/19/21 260 lb 6.4 oz (118.1 kg)  06/05/21 252 lb 12.8 oz (114.7 kg)  09/05/20 246 lb (111.6 kg)    Physical Exam Vitals and nursing note reviewed.  Constitutional:      General: She is awake. She is not in acute distress.    Appearance: She is well-developed and well-groomed. She is obese. She is not ill-appearing or toxic-appearing.  HENT:     Head: Normocephalic.     Right Ear: Hearing normal.     Left Ear: Hearing normal.     Nose: Nose normal.     Mouth/Throat:     Mouth: Mucous membranes are moist.  Eyes:     General: Lids are normal.        Right eye: No discharge.        Left eye: No discharge.     Conjunctiva/sclera: Conjunctivae normal.     Pupils: Pupils are  equal, round, and reactive to light.  Neck:     Thyroid: No thyromegaly.     Vascular: No carotid bruit or JVD.  Cardiovascular:     Rate and Rhythm: Normal rate and regular rhythm.     Heart sounds: Normal heart sounds. No murmur heard.    No gallop.     Comments: Negative Homan's bilaterally.  Good pattern present to both legs.  Mild xerosis left shin. Pulmonary:     Effort: Pulmonary effort is normal. No accessory muscle usage or respiratory distress.     Breath sounds: Normal breath sounds.  Abdominal:     General: Bowel sounds are normal.  There is no distension.     Palpations: Abdomen is soft.     Tenderness: There is no abdominal tenderness.  Musculoskeletal:     Cervical back: Normal range of motion and neck supple.     Right lower leg: 2+ Edema present.     Left lower leg: 2+ Edema present.  Lymphadenopathy:     Cervical: No cervical adenopathy.  Skin:    General: Skin is warm and dry.  Neurological:     Mental Status: She is alert and oriented to person, place, and time.  Psychiatric:        Attention and Perception: Attention normal.        Mood and Affect: Mood normal.        Speech: Speech normal.        Behavior: Behavior normal. Behavior is cooperative.        Thought Content: Thought content normal.    Results for orders placed or performed in visit on 06/05/21  Microscopic Examination   BLD  Result Value Ref Range   WBC, UA 0-5 0 - 5 /hpf   RBC, Urine 3-10 (A) 0 - 2 /hpf   Epithelial Cells (non renal) 0-10 0 - 10 /hpf   Casts Present (A) None seen /lpf   Cast Type Hyaline casts N/A   Crystals Present (A) N/A   Crystal Type Calcium Oxalate N/A   Mucus, UA Present (A) Not Estab.   Bacteria, UA Many (A) None seen/Few  Urine Culture   Specimen: Urine   UR  Result Value Ref Range   Urine Culture, Routine Final report    Organism ID, Bacteria Comment   Bayer DCA Hb A1c Waived  Result Value Ref Range   HB A1C (BAYER DCA - WAIVED) 5.4 4.8 - 5.6 %   Microalbumin, Urine Waived  Result Value Ref Range   Microalb, Ur Waived 80 (H) 0 - 19 mg/L   Creatinine, Urine Waived 200 10 - 300 mg/dL   Microalb/Creat Ratio 30-300 (H) <30 mg/g  Comprehensive metabolic panel  Result Value Ref Range   Glucose 116 (H) 70 - 99 mg/dL   BUN 16 6 - 24 mg/dL   Creatinine, Ser 0.87 0.57 - 1.00 mg/dL   eGFR 77 >59 mL/min/1.73   BUN/Creatinine Ratio 18 9 - 23   Sodium 142 134 - 144 mmol/L   Potassium 4.7 3.5 - 5.2 mmol/L   Chloride 104 96 - 106 mmol/L   CO2 25 20 - 29 mmol/L   Calcium 9.1 8.7 - 10.2 mg/dL   Total Protein 7.1 6.0 - 8.5 g/dL   Albumin 4.5 3.8 - 4.9 g/dL   Globulin, Total 2.6 1.5 - 4.5 g/dL   Albumin/Globulin Ratio 1.7 1.2 - 2.2   Bilirubin Total 0.2 0.0 - 1.2 mg/dL   Alkaline Phosphatase 84 44 - 121 IU/L   AST 18 0 - 40 IU/L   ALT 22 0 - 32 IU/L  Lipid Panel w/o Chol/HDL Ratio  Result Value Ref Range   Cholesterol, Total 268 (H) 100 - 199 mg/dL   Triglycerides 81 0 - 149 mg/dL   HDL 122 >39 mg/dL   VLDL Cholesterol Cal 13 5 - 40 mg/dL   LDL Chol Calc (NIH) 133 (H) 0 - 99 mg/dL  TSH  Result Value Ref Range   TSH 3.530 0.450 - 4.500 uIU/mL  T4, free  Result Value Ref Range   Free T4 1.09 0.82 - 1.77 ng/dL  VITAMIN D 25 Hydroxy (Vit-D  Deficiency, Fractures)  Result Value Ref Range   Vit D, 25-Hydroxy 36.9 30.0 - 100.0 ng/mL  Urinalysis, Routine w reflex microscopic  Result Value Ref Range   Specific Gravity, UA 1.025 1.005 - 1.030   pH, UA 5.5 5.0 - 7.5   Color, UA Yellow Yellow   Appearance Ur Cloudy (A) Clear   Leukocytes,UA 1+ (A) Negative   Protein,UA 1+ (A) Negative/Trace   Glucose, UA Negative Negative   Ketones, UA Trace (A) Negative   RBC, UA 2+ (A) Negative   Bilirubin, UA Negative Negative   Urobilinogen, Ur 0.2 0.2 - 1.0 mg/dL   Nitrite, UA Negative Negative   Microscopic Examination See below:       Assessment & Plan:   Problem List Items Addressed This Visit       Cardiovascular and Mediastinum    Hypertension    Chronic, stable.  BP at goal in office today.  Recommend she monitor BP at least a few mornings a week at home and document.  DASH diet at home.  Stop Amlodipine due to baseline leg edema, change to Valsartan 40 MG daily.  Educated her at length on this change and medication.  Labs today: CMP and CBC.         Relevant Medications   valsartan (DIOVAN) 40 MG tablet   Other Relevant Orders   CBC with Differential/Platelet   Comprehensive metabolic panel     Endocrine   Hypothyroidism    Chronic, ongoing.  Continue current medication regimen, adjust as needed.  Labs up to date and stable.        Other   Alcohol use disorder, moderate, dependence ([place])    Has not had a drink since July 2023.  Continues to attend AA meetings.  Praised for this.      Relevant Orders   CBC with Differential/Platelet   Comprehensive metabolic panel   Depression - Primary    Chronic, stable.  Follows with psychiatrist, Dr. Nicolasa Ducking, and currently taking Sertraline 200 mg daily.  Continue this collaboration and current regimen.  Recommend continued alcohol cessation.  Continue AA meetings.      Elevated hemoglobin A1c measurement    Recent level improved, 5.4%, recheck today.      Relevant Orders   HgB A1c   Hyperlipidemia    Chronic, ongoing.  Continue current medication regimen and adjust as needed.  Lipid panel today.      Relevant Medications   valsartan (DIOVAN) 40 MG tablet   Other Relevant Orders   Comprehensive metabolic panel   Lipid Panel w/o Chol/HDL Ratio   Leg swelling    At baseline with some worsening past two weeks.  Check labs today.  Suspect underlying lymphedema, recommend continued focus on weight loss -- could consider Wegovy in future.  Recommend compression hose on during day and off at night.  Elevate legs when possible.  Stop Amlodipine and change to Valsartan. Return in 6 weeks for follow-up.      Relevant Orders   B Nat Peptide   Morbid obesity ([place])     BMI 46.87 with HTN.  Recommended eating smaller high protein, low fat meals more frequently and exercising 30 mins a day 5 times a week with a goal of 10-15lb weight loss in the next 3 months. Patient voiced their understanding and motivation to adhere to these recommendations.       Vitamin D deficiency    Chronic, ongoing.  Continue daily supplement and recheck level, adjust as  needed. Family history of osteoporosis.      Relevant Orders   VITAMIN D 25 Hydroxy (Vit-D Deficiency, Fractures)   Other Visit Diagnoses     Encounter for screening mammogram for malignant neoplasm of breast       Mammogram ordered and discussed with patient, she will scheduled.   Relevant Orders   MM 3D SCREEN BREAST BILATERAL   Flu vaccine need       Flu vaccine today.        Follow up plan: Return in about 6 weeks (around 01/30/2022) for BLOOD PRESSURE AND LEG EDEMA -- stopped Amlodipine and started Valsartan.

## 2021-12-19 NOTE — Assessment & Plan Note (Signed)
Chronic, ongoing.  Continue daily supplement and recheck level, adjust as needed. Family history of osteoporosis.

## 2021-12-19 NOTE — Assessment & Plan Note (Signed)
Chronic, ongoing.  Continue current medication regimen and adjust as needed. Lipid panel today. 

## 2021-12-19 NOTE — Assessment & Plan Note (Signed)
BMI 46.87 with HTN.  Recommended eating smaller high protein, low fat meals more frequently and exercising 30 mins a day 5 times a week with a goal of 10-15lb weight loss in the next 3 months. Patient voiced their understanding and motivation to adhere to these recommendations.

## 2021-12-19 NOTE — Assessment & Plan Note (Addendum)
Chronic, stable.  BP at goal in office today.  Recommend she monitor BP at least a few mornings a week at home and document.  DASH diet at home.  Stop Amlodipine due to baseline leg edema, change to Valsartan 40 MG daily.  Educated her at length on this change and medication.  Labs today: CMP and CBC.

## 2021-12-19 NOTE — Assessment & Plan Note (Signed)
At baseline with some worsening past two weeks.  Check labs today.  Suspect underlying lymphedema, recommend continued focus on weight loss -- could consider Wegovy in future.  Recommend compression hose on during day and off at night.  Elevate legs when possible.  Stop Amlodipine and change to Valsartan. Return in 6 weeks for follow-up.

## 2021-12-19 NOTE — Assessment & Plan Note (Signed)
Has not had a drink since July 2023.  Continues to attend AA meetings.  Praised for this.

## 2021-12-19 NOTE — Assessment & Plan Note (Signed)
Chronic, ongoing.  Continue current medication regimen, adjust as needed.  Labs up to date and stable.

## 2021-12-19 NOTE — Assessment & Plan Note (Signed)
Chronic, stable.  Follows with psychiatrist, Dr. Nicolasa Ducking, and currently taking Sertraline 200 mg daily.  Continue this collaboration and current regimen.  Recommend continued alcohol cessation.  Continue AA meetings.

## 2021-12-19 NOTE — Assessment & Plan Note (Signed)
Recent level improved, 5.4%, recheck today.

## 2021-12-20 ENCOUNTER — Telehealth: Payer: Self-pay

## 2021-12-20 ENCOUNTER — Other Ambulatory Visit: Payer: Self-pay | Admitting: Nurse Practitioner

## 2021-12-20 DIAGNOSIS — R7989 Other specified abnormal findings of blood chemistry: Secondary | ICD-10-CM

## 2021-12-20 LAB — BRAIN NATRIURETIC PEPTIDE: BNP: 37 pg/mL (ref 0.0–100.0)

## 2021-12-20 LAB — LIPID PANEL W/O CHOL/HDL RATIO
Cholesterol, Total: 222 mg/dL — ABNORMAL HIGH (ref 100–199)
HDL: 113 mg/dL (ref >39)
LDL Chol Calc (NIH): 98 mg/dL (ref 0–99)
Triglycerides: 62 mg/dL (ref 0–149)
VLDL Cholesterol Cal: 11 mg/dL (ref 5–40)

## 2021-12-20 LAB — COMPREHENSIVE METABOLIC PANEL
ALT: 15 IU/L (ref 0–32)
AST: 21 IU/L (ref 0–40)
Albumin/Globulin Ratio: 1.5 (ref 1.2–2.2)
Albumin: 4.6 g/dL (ref 3.8–4.9)
Alkaline Phosphatase: 89 IU/L (ref 44–121)
BUN/Creatinine Ratio: 10 (ref 9–23)
BUN: 7 mg/dL (ref 6–24)
Bilirubin Total: 0.5 mg/dL (ref 0.0–1.2)
CO2: 24 mmol/L (ref 20–29)
Calcium: 9.2 mg/dL (ref 8.7–10.2)
Chloride: 104 mmol/L (ref 96–106)
Creatinine, Ser: 0.67 mg/dL (ref 0.57–1.00)
Globulin, Total: 3.1 g/dL (ref 1.5–4.5)
Glucose: 94 mg/dL (ref 70–99)
Potassium: 4.7 mmol/L (ref 3.5–5.2)
Sodium: 140 mmol/L (ref 134–144)
Total Protein: 7.7 g/dL (ref 6.0–8.5)
eGFR: 101 mL/min/{1.73_m2} (ref >59)

## 2021-12-20 LAB — CBC WITH DIFFERENTIAL/PLATELET
Basophils Absolute: 0 10*3/uL (ref 0.0–0.2)
Basos: 0 %
EOS (ABSOLUTE): 0.2 10*3/uL (ref 0.0–0.4)
Eos: 2 %
Hematocrit: 44.3 % (ref 34.0–46.6)
Hemoglobin: 14.6 g/dL (ref 11.1–15.9)
Immature Grans (Abs): 0 10*3/uL (ref 0.0–0.1)
Immature Granulocytes: 0 %
Lymphocytes Absolute: 1.8 10*3/uL (ref 0.7–3.1)
Lymphs: 17 %
MCH: 29.6 pg (ref 26.6–33.0)
MCHC: 33 g/dL (ref 31.5–35.7)
MCV: 90 fL (ref 79–97)
Monocytes Absolute: 0.6 10*3/uL (ref 0.1–0.9)
Monocytes: 6 %
Neutrophils Absolute: 8.3 10*3/uL — ABNORMAL HIGH (ref 1.4–7.0)
Neutrophils: 75 %
Platelets: 240 10*3/uL (ref 150–450)
RBC: 4.94 x10E6/uL (ref 3.77–5.28)
RDW: 13.3 % (ref 11.7–15.4)
WBC: 11 10*3/uL — ABNORMAL HIGH (ref 3.4–10.8)

## 2021-12-20 LAB — VITAMIN D 25 HYDROXY (VIT D DEFICIENCY, FRACTURES): Vit D, 25-Hydroxy: 32.5 ng/mL (ref 30.0–100.0)

## 2021-12-20 LAB — HEMOGLOBIN A1C
Est. average glucose Bld gHb Est-mCnc: 120 mg/dL
Hgb A1c MFr Bld: 5.8 % — ABNORMAL HIGH (ref 4.8–5.6)

## 2021-12-20 NOTE — Progress Notes (Signed)
Good afternoon, please let Jessica Brennan know labs have returned: - CBC does show mild elevation in white blood cell count and neutrophils, can be related to infection.  I do recommend we recheck this on outpatient labs in 4 weeks -- please schedule. - Cholesterol labs stable, would like to see a little lower and we may increase Atorvastatin next visit, will discuss at visit. - A1c remains in prediabetic range, 5.8%, continue diet and activity focus. - Kidney and liver function normal and Vitamin D normal. Waiting on one more lab to return in regard to edema and will let you know when it does.  Any questions? Keep being amazing!!  Thank you for allowing me to participate in your care.  I appreciate you. Kindest regards, Belen Pesch

## 2021-12-20 NOTE — Telephone Encounter (Signed)
-----   Message from Georgina Peer, [place] sent at 12/19/2021 10:16 AM EDT ----- Mammogram- around 10 any day

## 2021-12-20 NOTE — Telephone Encounter (Signed)
Pt returned call, she got message about mammogram date and time.

## 2021-12-20 NOTE — Telephone Encounter (Signed)
Mammogram scheduled at [facility] on 01/26/22 at 10:40 AM. Called and LVM asking for patient to please return my call.   OK for [facility] to speak to patient and give her the appointment date and time above.

## 2022-01-17 ENCOUNTER — Other Ambulatory Visit: Payer: BC Managed Care – PPO

## 2022-01-30 ENCOUNTER — Other Ambulatory Visit: Payer: BC Managed Care – PPO

## 2022-03-20 ENCOUNTER — Encounter: Payer: Self-pay | Admitting: Ophthalmology

## 2022-03-21 ENCOUNTER — Encounter: Payer: Self-pay | Admitting: Anesthesiology

## 2022-03-26 NOTE — Discharge Instructions (Signed)

## 2022-03-28 ENCOUNTER — Ambulatory Visit: Admission: RE | Admit: 2022-03-28 | Payer: BC Managed Care – PPO | Source: Home / Self Care | Admitting: Ophthalmology

## 2022-03-28 HISTORY — DX: Other complications of anesthesia, initial encounter: T88.59XA

## 2022-03-28 HISTORY — DX: Contact with and (suspected) exposure to tuberculosis: Z20.1

## 2022-03-28 SURGERY — PHACOEMULSIFICATION, CATARACT, WITH IOL INSERTION
Anesthesia: Topical | Laterality: Right

## 2022-05-17 ENCOUNTER — Encounter: Payer: Self-pay | Admitting: Anesthesiology

## 2022-05-17 ENCOUNTER — Encounter: Payer: Self-pay | Admitting: Ophthalmology

## 2022-05-28 NOTE — Discharge Instructions (Signed)

## 2022-05-29 NOTE — Anesthesia Preprocedure Evaluation (Signed)
Anesthesia Evaluation  Patient identified by MRN, date of birth, ID band Patient awake    Airway        Dental   Pulmonary asthma , sleep apnea and Continuous Positive Airway Pressure Ventilation           Cardiovascular hypertension,      Neuro/Psych  PSYCHIATRIC DISORDERS Anxiety Depression       GI/Hepatic   Endo/Other  Hypothyroidism    Renal/GU      Musculoskeletal   Abdominal   Peds  Hematology   Anesthesia Other Findings   Reproductive/Obstetrics                             Anesthesia Physical Anesthesia Plan Anesthesia Quick Evaluation

## 2022-05-30 ENCOUNTER — Ambulatory Visit: Admission: RE | Admit: 2022-05-30 | Payer: BC Managed Care – PPO | Source: Home / Self Care | Admitting: Ophthalmology

## 2022-05-30 SURGERY — PHACOEMULSIFICATION, CATARACT, WITH IOL INSERTION
Anesthesia: Topical | Laterality: Right

## 2022-07-14 ENCOUNTER — Other Ambulatory Visit: Payer: Self-pay | Admitting: Nurse Practitioner

## 2022-07-14 DIAGNOSIS — E039 Hypothyroidism, unspecified: Secondary | ICD-10-CM

## 2022-07-16 NOTE — Telephone Encounter (Signed)
Requested medication (s) are due for refill today -expired Rx  Requested medication (s) are on the active medication list -yes  Future visit scheduled -no  Last refill: 06/05/21 #90 4RF  Notes to clinic: expired Rx, fails lab protocol - over 1 year -06/05/21  Requested Prescriptions  Pending Prescriptions Disp Refills   levothyroxine (SYNTHROID) 75 MCG tablet [Pharmacy Med Name: LEVOTHYROXINE 75 MCG TABLET] 90 tablet 4    Sig: TAKE 1 TABLET BY MOUTH DAILY BEFORE BREAKFAST.     Endocrinology:  Hypothyroid Agents Failed - 07/14/2022 10:38 AM      Failed - TSH in normal range and within 360 days    TSH  Date Value Ref Range Status  06/05/2021 3.530 0.450 - 4.500 uIU/mL Final         Passed - Valid encounter within last 12 months    Recent Outpatient Visits           6 months ago Current severe episode of major depressive disorder without psychotic features without prior episode (HCC)   [place] [facility] [place], Corrie Dandy T, NP   1 year ago Morbid obesity [facility])   [place] [facility] [place], Corrie Dandy T, NP   1 year ago COVID   [place] [facility] Larae Grooms, NP   1 year ago Alcohol use disorder, moderate, dependence (HCC)   [place] [facility] [place], [place] T, NP   2 years ago Alcohol use disorder, moderate, dependence (HCC)   [place] [facility] [place], Corrie Dandy T, NP                 Requested Prescriptions  Pending Prescriptions Disp Refills   levothyroxine (SYNTHROID) 75 MCG tablet [Pharmacy Med Name: LEVOTHYROXINE 75 MCG TABLET] 90 tablet 4    Sig: TAKE 1 TABLET BY MOUTH DAILY BEFORE BREAKFAST.     Endocrinology:  Hypothyroid Agents Failed - 07/14/2022 10:38 AM      Failed - TSH in normal range and within 360 days    TSH  Date Value Ref Range Status  06/05/2021 3.530 0.450 - 4.500 uIU/mL Final         Passed - Valid encounter within last 12 months    Recent Outpatient  Visits           6 months ago Current severe episode of major depressive disorder without psychotic features without prior episode (HCC)   [place] [facility] [place], Corrie Dandy T, NP   1 year ago Morbid obesity [facility])   [place] [facility] [place], Corrie Dandy T, NP   1 year ago COVID   [place] [facility] Larae Grooms, NP   1 year ago Alcohol use disorder, moderate, dependence (HCC)    [place] [place], [place] T, NP   2 years ago Alcohol use disorder, moderate, dependence (HCC)    [place] [facility] [place], Dorie Rank, NP

## 2022-07-16 NOTE — Telephone Encounter (Signed)
Called and scheduled patient for an appointment tomorrow 07/17/2022 @ 10:40 am.

## 2022-07-16 NOTE — Telephone Encounter (Signed)
Patient is overdue for an appointment. Please call to schedule and then route to provider for refill.  

## 2022-07-17 ENCOUNTER — Ambulatory Visit: Payer: BC Managed Care – PPO | Admitting: Nurse Practitioner

## 2022-08-14 ENCOUNTER — Ambulatory Visit: Payer: BC Managed Care – PPO | Admitting: Nurse Practitioner

## 2022-08-14 DIAGNOSIS — G4733 Obstructive sleep apnea (adult) (pediatric): Secondary | ICD-10-CM

## 2022-08-14 DIAGNOSIS — R7309 Other abnormal glucose: Secondary | ICD-10-CM

## 2022-08-14 DIAGNOSIS — F1020 Alcohol dependence, uncomplicated: Secondary | ICD-10-CM

## 2022-08-14 DIAGNOSIS — Z1211 Encounter for screening for malignant neoplasm of colon: Secondary | ICD-10-CM

## 2022-08-14 DIAGNOSIS — E039 Hypothyroidism, unspecified: Secondary | ICD-10-CM

## 2022-08-14 DIAGNOSIS — E559 Vitamin D deficiency, unspecified: Secondary | ICD-10-CM

## 2022-08-14 DIAGNOSIS — E782 Mixed hyperlipidemia: Secondary | ICD-10-CM

## 2022-08-14 DIAGNOSIS — F322 Major depressive disorder, single episode, severe without psychotic features: Secondary | ICD-10-CM

## 2022-08-14 DIAGNOSIS — I10 Essential (primary) hypertension: Secondary | ICD-10-CM

## 2022-08-24 ENCOUNTER — Other Ambulatory Visit: Payer: Self-pay | Admitting: Nurse Practitioner

## 2022-08-27 NOTE — Telephone Encounter (Signed)
Requested Prescriptions  Pending Prescriptions Disp Refills   atorvastatin (LIPITOR) 20 MG tablet [Pharmacy Med Name: ATORVASTATIN 20 MG TABLET] 90 tablet 3    Sig: TAKE 1 TABLET BY MOUTH EVERY DAY     Cardiovascular:  Antilipid - Statins Failed - 08/24/2022  2:22 AM      Failed - Lipid Panel in normal range within the last 12 months    Cholesterol, Total  Date Value Ref Range Status  12/19/2021 222 (H) 100 - 199 mg/dL Final   Cholesterol Piccolo, Waived  Date Value Ref Range Status  08/19/2018 132 <200 mg/dL Final    Comment:                            Desirable                <200                         Borderline High      200- 239                         High                     >239    LDL Chol Calc (NIH)  Date Value Ref Range Status  12/19/2021 98 0 - 99 mg/dL Final   LDL Direct  Date Value Ref Range Status  10/17/2017 200 (H) 0 - 99 mg/dL Final   HDL  Date Value Ref Range Status  12/19/2021 113 >39 mg/dL Final   Triglycerides  Date Value Ref Range Status  12/19/2021 62 0 - 149 mg/dL Final   Triglycerides Piccolo,Waived  Date Value Ref Range Status  08/19/2018 174 (H) <150 mg/dL Final    Comment:                            Normal                   <150                         Borderline High     150 - 199                         High                200 - 499                         Very High                >499          Passed - Patient is not pregnant      Passed - Valid encounter within last 12 months    Recent Outpatient Visits           8 months ago Current severe episode of major depressive disorder without psychotic features without prior episode (HCC)   [place] [facility] [place], Corrie Dandy T, NP   1 year ago Morbid obesity (HCC)   [place] [facility] [place], Corrie Dandy T, NP   1 year ago COVID   [place] [facility] Larae Grooms, NP   1 year  ago Alcohol use disorder, moderate, dependence (HCC)    [place] [facility] [place], [place] T, NP   2 years ago Alcohol use disorder, moderate, dependence (HCC)   [place] [facility] [place], Dorie Rank, NP       Future Appointments             In 2 days Marjie Skiff, NP [place] [facility] [place], [facility]

## 2022-08-29 ENCOUNTER — Ambulatory Visit: Payer: BC Managed Care – PPO | Admitting: Nurse Practitioner

## 2022-08-29 DIAGNOSIS — I10 Essential (primary) hypertension: Secondary | ICD-10-CM

## 2022-08-29 DIAGNOSIS — Z1211 Encounter for screening for malignant neoplasm of colon: Secondary | ICD-10-CM

## 2022-08-29 DIAGNOSIS — F1020 Alcohol dependence, uncomplicated: Secondary | ICD-10-CM

## 2022-08-29 DIAGNOSIS — F322 Major depressive disorder, single episode, severe without psychotic features: Secondary | ICD-10-CM

## 2022-08-29 DIAGNOSIS — E782 Mixed hyperlipidemia: Secondary | ICD-10-CM

## 2022-08-29 DIAGNOSIS — E559 Vitamin D deficiency, unspecified: Secondary | ICD-10-CM

## 2022-08-29 DIAGNOSIS — R7309 Other abnormal glucose: Secondary | ICD-10-CM

## 2022-08-29 DIAGNOSIS — G4733 Obstructive sleep apnea (adult) (pediatric): Secondary | ICD-10-CM

## 2022-08-29 DIAGNOSIS — E039 Hypothyroidism, unspecified: Secondary | ICD-10-CM

## 2022-12-24 ENCOUNTER — Ambulatory Visit: Payer: Self-pay | Admitting: *Deleted

## 2022-12-24 ENCOUNTER — Encounter: Payer: Self-pay | Admitting: Family Medicine

## 2022-12-24 ENCOUNTER — Ambulatory Visit (INDEPENDENT_AMBULATORY_CARE_PROVIDER_SITE_OTHER): Payer: BC Managed Care – PPO | Admitting: Family Medicine

## 2022-12-24 VITALS — BP 130/70 | HR 92 | Temp 98.6°F | Resp 18 | Ht 62.0 in | Wt 255.0 lb

## 2022-12-24 DIAGNOSIS — J4521 Mild intermittent asthma with (acute) exacerbation: Secondary | ICD-10-CM | POA: Diagnosis not present

## 2022-12-24 DIAGNOSIS — J302 Other seasonal allergic rhinitis: Secondary | ICD-10-CM | POA: Diagnosis not present

## 2022-12-24 DIAGNOSIS — J069 Acute upper respiratory infection, unspecified: Secondary | ICD-10-CM

## 2022-12-24 MED ORDER — AIRSUPRA 90-80 MCG/ACT IN AERO: 2.0000 | INHALATION_SPRAY | Freq: Four times a day (QID) | RESPIRATORY_TRACT | 2 refills | Status: DC | PRN

## 2022-12-24 MED ORDER — ALBUTEROL SULFATE (2.5 MG/3ML) 0.083% IN NEBU
2.5000 mg | INHALATION_SOLUTION | Freq: Once | RESPIRATORY_TRACT | Status: AC
  Administered 2022-12-24: 2.5 mg via RESPIRATORY_TRACT

## 2022-12-24 MED ORDER — BUDESONIDE-FORMOTEROL FUMARATE 160-4.5 MCG/ACT IN AERO
2.0000 | INHALATION_SPRAY | Freq: Two times a day (BID) | RESPIRATORY_TRACT | 12 refills | Status: DC
Start: 2022-12-24 — End: 2023-08-26

## 2022-12-24 MED ORDER — NEBULIZER/TUBING/MOUTHPIECE KIT: PACK | 0 refills | Status: DC

## 2022-12-24 MED ORDER — MONTELUKAST SODIUM 10 MG PO TABS
10.0000 mg | ORAL_TABLET | Freq: Every day | ORAL | 1 refills | Status: DC
Start: 2022-12-24 — End: 2023-08-26

## 2022-12-24 MED ORDER — PREDNISONE 20 MG PO TABS
40.0000 mg | ORAL_TABLET | Freq: Every day | ORAL | 0 refills | Status: AC
Start: 2022-12-24 — End: 2022-12-29

## 2022-12-24 MED ORDER — LORATADINE 10 MG PO TABS
10.0000 mg | ORAL_TABLET | Freq: Every day | ORAL | 11 refills | Status: DC
Start: 2022-12-24 — End: 2023-08-26

## 2022-12-24 MED ORDER — IPRATROPIUM-ALBUTEROL 0.5-2.5 (3) MG/3ML IN SOLN: 3.0000 mL | Freq: Four times a day (QID) | RESPIRATORY_TRACT | 0 refills | Status: DC | PRN

## 2022-12-24 NOTE — Progress Notes (Signed)
Patient ID: Jessica Brennan, female    DOB: [birth year], 57 y.o.   MRN: [id number]  PCP: Marjie Skiff, NP  Chief Complaint  Patient presents with   Shortness of Breath    X1 week   Cough    Subjective:   Jessica Brennan is a 57 y.o. female, presents to clinic with CC of the following:  HPI  Hx of seasonal allergies and asthma, she started to have worse seasonal sx and so she started her 2x daily medications (old expired albuterol inhaler) recently and then over the weekend she had sick contacts and started to feel sick herself with more nasal congestion, drainage, ear itching and more coughing, body aches, SOB with walking, no fever, sweats She's tried mucinex, she's not on her allergy pills, she is using old inhaler 2x a day (albuterol) Hx of severe asthma and pneumonia, usually  has an asthma exacerbation in the fall and with URI's She feels like this is a bad asthma exacerbation but not like when she had pneumonia She hasn't been on maintenance inhalers or seen pulmonology but she does plan to see pulm at some point (she hates coming to doctors offices and tends to wait until she's very sick) 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 been 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 (multiple sick contacts)      Patient Active Problem List   Diagnosis Date Noted   Elevated hemoglobin A1c measurement 09/04/2020   Alcohol use disorder, moderate, dependence (HCC) 06/17/2020   S/P TKR (total knee replacement) using cement, left 11/20/2018   S/P TKR (total knee replacement) using cement, right 09/18/2018   Hypertension 03/30/2016   Hypothyroidism 01/12/2015   Depression 10/07/2014   Vitamin D deficiency 10/07/2014   Morbid obesity (HCC) 10/07/2014   Sleep apnea 10/07/2014   Hyperlipidemia 10/07/2014      Current Outpatient Medications:    albuterol (VENTOLIN HFA) 108 (90 Base) MCG/ACT inhaler, Inhale 2 puffs into the lungs  every 6 (six) hours as needed for wheezing or shortness of breath., Disp: 1 each, Rfl: 2   atorvastatin (LIPITOR) 20 MG tablet, TAKE 1 TABLET BY MOUTH EVERY DAY, Disp: 90 tablet, Rfl: 3   cholecalciferol (VITAMIN D3) 25 MCG (1000 UNIT) tablet, Take 2,000 Units by mouth daily., Disp: , Rfl:    levothyroxine (SYNTHROID) 75 MCG tablet, TAKE 1 TABLET BY MOUTH DAILY BEFORE BREAKFAST., Disp: 90 tablet, Rfl: 4   Multiple Vitamin (MULTIVITAMIN) tablet, Take 1 tablet by mouth daily., Disp: , Rfl:    sertraline (ZOLOFT) 100 MG tablet, Take 200 mg by mouth daily., Disp: , Rfl:    valsartan (DIOVAN) 40 MG tablet, Take 1 tablet (40 mg total) by mouth daily., Disp: 90 tablet, Rfl: 4   Allergies  Allergen Reactions   Codeine Sulfate Hives and Other (See Comments)    Stomachache, GI upset, welts   Other Other (See Comments)    Mold- Sinus infections   Pollen Extract Other (See Comments)    sneezing     Social History   Tobacco Use   Smoking status: Never   Smokeless tobacco: Never  Vaping Use   Vaping status: Never Used  Substance Use Topics   Alcohol use: Yes    Alcohol/week: 6.0 standard drinks of alcohol    Types: 6 Cans of beer per week    Comment: on weekends   Drug use: No      Chart Review Today:  I personally reviewed active problem list, medication list, allergies, family history, social history, health maintenance, notes from last encounter, lab results, imaging with the patient/caregiver today.   Review of Systems  Constitutional: Negative.   HENT: Negative.    Eyes: Negative.   Respiratory: Negative.    Cardiovascular: Negative.   Gastrointestinal: Negative.   Endocrine: Negative.   Genitourinary: Negative.   Musculoskeletal: Negative.   Skin: Negative.   Allergic/Immunologic: Negative.   Neurological: Negative.   Hematological: Negative.   Psychiatric/Behavioral: Negative.    All other systems reviewed and are negative.      Objective:   Vitals:   12/24/22  1103  BP: 130/70  Pulse: 92  Resp: 18  Temp: 98.6 F (37 C)  SpO2: 100%  Weight: 255 lb (115.7 kg)  Height: 5\' 2"  (1.575 m)    Body mass index is 46.64 kg/m.  Physical Exam Vitals and nursing note reviewed.  Constitutional:      General: She is not in acute distress.    Appearance: She is well-developed. She is obese. She is ill-appearing (chronically ill appearing). She is not toxic-appearing or diaphoretic.  HENT:     Head: Normocephalic and atraumatic.     Right Ear: Hearing, tympanic membrane, ear canal and external ear normal.     Left Ear: Hearing, tympanic membrane, ear canal and external ear normal.     Nose: Mucosal edema and rhinorrhea present.     Right Sinus: No maxillary sinus tenderness or frontal sinus tenderness.     Left Sinus: No maxillary sinus tenderness or frontal sinus tenderness.     Mouth/Throat:     Mouth: Mucous membranes are normal. Mucous membranes are not pale.     Pharynx: Uvula midline. No oropharyngeal exudate or uvula swelling.     Tonsils: No tonsillar abscesses.  Eyes:     General:        Right eye: No discharge.        Left eye: No discharge.     Conjunctiva/sclera: Conjunctivae normal.     Pupils: Pupils are equal, round, and reactive to light.  Neck:     Trachea: No tracheal deviation.  Cardiovascular:     Rate and Rhythm: Normal rate and regular rhythm.     Heart sounds: Normal heart sounds.  Pulmonary:     Effort: Pulmonary effort is normal. Tachypnea present. No accessory muscle usage, respiratory distress or retractions.     Breath sounds: No stridor or transmitted upper airway sounds. Examination of the right-upper field reveals wheezing. Examination of the left-upper field reveals wheezing. Examination of the right-middle field reveals wheezing. Examination of the left-middle field reveals wheezing. Examination of the right-lower field reveals decreased breath sounds and wheezing. Examination of the left-lower field reveals  decreased breath sounds and wheezing. Decreased breath sounds, wheezing (inspiratory and expiratory wheeze) and rhonchi present.     Comments: Able to speak in short sentences Abdominal:     General: Bowel sounds are normal. There is no distension.     Palpations: Abdomen is soft.  Musculoskeletal:        General: Normal range of motion.     Cervical back: Normal range of motion and neck supple.  Skin:    General: Skin is warm and dry.     Coloration: Skin is not pale.     Findings: No rash.  Neurological:     Mental Status: She is alert.     Motor: No abnormal muscle tone.  Coordination: Coordination normal.  Psychiatric:        Mood and Affect: Mood and affect normal.        Behavior: Behavior normal. Behavior is cooperative.      Results for orders placed or performed in visit on 12/19/21  CBC with Differential/Platelet  Result Value Ref Range   WBC 11.0 (H) 3.4 - 10.8 x10E3/uL   RBC 4.94 3.77 - 5.28 x10E6/uL   Hemoglobin 14.6 11.1 - 15.9 g/dL   Hematocrit 16.1 09.6 - 46.6 %   MCV 90 79 - 97 fL   MCH 29.6 26.6 - 33.0 pg   MCHC 33.0 31.5 - 35.7 g/dL   RDW 04.5 40.9 - 81.1 %   Platelets 240 150 - 450 x10E3/uL   Neutrophils 75 Not Estab. %   Lymphs 17 Not Estab. %   Monocytes 6 Not Estab. %   Eos 2 Not Estab. %   Basos 0 Not Estab. %   Neutrophils Absolute 8.3 (H) 1.4 - 7.0 x10E3/uL   Lymphocytes Absolute 1.8 0.7 - 3.1 x10E3/uL   Monocytes Absolute 0.6 0.1 - 0.9 x10E3/uL   EOS (ABSOLUTE) 0.2 0.0 - 0.4 x10E3/uL   Basophils Absolute 0.0 0.0 - 0.2 x10E3/uL   Immature Granulocytes 0 Not Estab. %   Immature Grans (Abs) 0.0 0.0 - 0.1 x10E3/uL  Comprehensive metabolic panel  Result Value Ref Range   Glucose 94 70 - 99 mg/dL   BUN 7 6 - 24 mg/dL   Creatinine, Ser 9.14 0.57 - 1.00 mg/dL   eGFR 782 >95 AO/ZHY/8.65   BUN/Creatinine Ratio 10 9 - 23   Sodium 140 134 - 144 mmol/L   Potassium 4.7 3.5 - 5.2 mmol/L   Chloride 104 96 - 106 mmol/L   CO2 24 20 - 29 mmol/L    Calcium 9.2 8.7 - 10.2 mg/dL   Total Protein 7.7 6.0 - 8.5 g/dL   Albumin 4.6 3.8 - 4.9 g/dL   Globulin, Total 3.1 1.5 - 4.5 g/dL   Albumin/Globulin Ratio 1.5 1.2 - 2.2   Bilirubin Total 0.5 0.0 - 1.2 mg/dL   Alkaline Phosphatase 89 44 - 121 IU/L   AST 21 0 - 40 IU/L   ALT 15 0 - 32 IU/L  Lipid Panel w/o Chol/HDL Ratio  Result Value Ref Range   Cholesterol, Total 222 (H) 100 - 199 mg/dL   Triglycerides 62 0 - 149 mg/dL   HDL 784 >69 mg/dL   VLDL Cholesterol Cal 11 5 - 40 mg/dL   LDL Chol Calc (NIH) 98 0 - 99 mg/dL  HgB G2X  Result Value Ref Range   Hgb A1c MFr Bld 5.8 (H) 4.8 - 5.6 %   Est. average glucose Bld gHb Est-mCnc 120 mg/dL  VITAMIN D 25 Hydroxy (Vit-D Deficiency, Fractures)  Result Value Ref Range   Vit D, 25-Hydroxy 32.5 30.0 - 100.0 ng/mL  B Nat Peptide  Result Value Ref Range   BNP 37.0 0.0 - 100.0 pg/mL       Assessment & Plan:   1. Upper respiratory tract infection, unspecified type Encouraged her to do home COVID testing and let us know results -candidate for paxlovid if positive Otherwise supportive and sx measures - cough meds, nasal sprays, decongestants, push fluids, rest Stay home if febrile (no fever today)   2. 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  Dispense: 10 tablet; Refill: 0 - Albuterol-Budesonide (AIRSUPRA) 90-80 MCG/ACT AERO; Inhale 2 puffs into the lungs 4 (four) times daily as needed (wheeze, shortness of breath).  Dispense: 10.7 g; Refill: 2 - budesonide-formoterol (SYMBICORT) 160-4.5 MCG/ACT inhaler; Inhale 2 puffs into the lungs 2 (two) times daily.  Dispense: 1 each; Refill: 12 - albuterol (PROVENTIL) (2.5 MG/3ML) 0.083% nebulizer solution 2.5 mg - Respiratory Therapy Supplies (NEBULIZER/TUBING/MOUTHPIECE) KIT; Disp one nebulizer machine, tubing set and mouthpiece kit  Dispense: 1 kit; Refill: 0 - ipratropium-albuterol (DUONEB) 0.5-2.5 (3) MG/3ML SOLN; Take 3 mLs by nebulization every 6 (six) hours as needed (home nebs for asthma exacerbation symptoms).  Dispense: 180 mL; Refill: 0  3. Seasonal allergies Restart antihistamine and she would like to try singulair as well  - loratadine (CLARITIN) 10 MG tablet; Take 1 tablet (10 mg total) by mouth daily.  Dispense: 30 tablet; Refill: 11 - montelukast (SINGULAIR) 10 MG tablet; Take 1 tablet (10 mg total) by mouth at bedtime.  Dispense: 30 tablet; Refill: 1   Pt was given a albuterol neb tx in office and reexamined afterwards-  sig improvement in BS after neb tx Cleared inspiratory wheeze throughout, some persistent lower lung field exp wheeze, better air movement throughout, pt noted immediate improvement in her dyspnea, tachypnea was improved and she was able to speak in full sentences and observed walking out of clinic without any respiratory distress or increased WOB  Neb machine order supplies added Duoneb vials added to pharmacy med order Explained it will take a few days and working with med supply store to get her the home neb machine but it would be helpful for her to have home neb tx when she gets sick or has seasonal allergies flare up her asthma   F/up with PCP in 1-2 months  for recheck of asthma and allergy control    Danelle Berry, PA-C 12/24/22 11:07 AM

## 2022-12-24 NOTE — Telephone Encounter (Signed)
  Chief Complaint: shortness of breath at rest , sinus issues, congestion Symptoms: see above, has not tested for covid . Hx asthma, inhaler not working  Frequency: Saturday  Pertinent Negatives: Patient denies chest pain, no difficulty breathing , no fever Disposition: [] ED /[] Urgent Care (no appt availability in office) / [x] Appointment(In office/virtual)/ []  [facility]/ [] Home Care/ [] Refused Recommended Disposition /[] [place] [place] [facility]/ []  Follow-up with PCP Additional Notes:   No available appt with PCP that patient could make today. Scheduled with [facility]/ same day with L. Angelica Chessman, PA.    Reason for Disposition  [1] MILD difficulty breathing (e.g., minimal/no SOB at rest, SOB with walking, pulse <100) AND [2] NEW-onset or WORSE than normal  Answer Assessment - Initial Assessment Questions 1. RESPIRATORY STATUS: "Describe your breathing?" (e.g., wheezing, shortness of breath, unable to speak, severe coughing)      Shortness of breath , coughing  2. ONSET: "When did this breathing problem begin?"      Saturday  3. PATTERN "Does the difficult breathing come and go, or has it been constant since it started?"      Constant  4. SEVERITY: "How bad is your breathing?" (e.g., mild, moderate, severe)    - MILD: No SOB at rest, mild SOB with walking, speaks normally in sentences, can lie down, no retractions, pulse < 100.    - MODERATE: SOB at rest, SOB with minimal exertion and prefers to sit, cannot lie down flat, speaks in phrases, mild retractions, audible wheezing, pulse 100-120.    - SEVERE: Very SOB at rest, speaks in single words, struggling to breathe, sitting hunched forward, retractions, pulse > 120      Mild - moderate 5. RECURRENT SYMPTOM: "Have you had difficulty breathing before?" If Yes, ask: "When was the last time?" and "What happened that time?"      Yes  6. CARDIAC HISTORY: "Do you have any history of heart disease?" (e.g., heart attack, angina, bypass surgery,  angioplasty)      na 7. LUNG HISTORY: "Do you have any history of lung disease?"  (e.g., pulmonary embolus, asthma, emphysema)     Hx asthma 8. CAUSE: "What do you think is causing the breathing problem?"      Sinus issues congestion 9. OTHER SYMPTOMS: "Do you have any other symptoms? (e.g., dizziness, runny nose, cough, chest pain, fever)     Sinus issues no runny nose now. Congestion, cough , shortness of breath at rest  10. O2 SATURATION MONITOR:  "Do you use an oxygen saturation monitor (pulse oximeter) at home?" If Yes, ask: "What is your reading (oxygen level) today?" "What is your usual oxygen saturation reading?" (e.g., 95%)       na 11. PREGNANCY: "Is there any chance you are pregnant?" "When was your last menstrual period?"       na 12. TRAVEL: "Have you traveled out of the country in the last month?" (e.g., travel history, exposures)       na  Protocols used: Breathing Difficulty-A-AH

## 2022-12-25 ENCOUNTER — Other Ambulatory Visit: Payer: Self-pay | Admitting: Nurse Practitioner

## 2022-12-25 NOTE — Telephone Encounter (Signed)
Requested Prescriptions  Pending Prescriptions Disp Refills   valsartan (DIOVAN) 40 MG tablet [Pharmacy Med Name: VALSARTAN 40 MG TABLET] 90 tablet 0    Sig: TAKE 1 TABLET BY MOUTH EVERY DAY     Cardiovascular:  Angiotensin Receptor Blockers Failed - 12/25/2022  2:33 AM      Failed - Cr in normal range and within 180 days    Creatinine, Ser  Date Value Ref Range Status  12/19/2021 0.67 0.57 - 1.00 mg/dL Final         Failed - K in normal range and within 180 days    Potassium  Date Value Ref Range Status  12/19/2021 4.7 3.5 - 5.2 mmol/L Final         Passed - Patient is not pregnant      Passed - Last BP in normal range    BP Readings from Last 1 Encounters:  12/24/22 130/70         Passed - Valid encounter within last 6 months    Recent Outpatient Visits           Yesterday Upper respiratory tract infection, unspecified type   [facility] Danelle Berry, PA-C   1 year ago Current severe episode of major depressive disorder without psychotic features without prior episode (HCC)   [facility] [place], Dorie Rank, NP   1 year ago Morbid obesity [facility])   [facility] [place], Corrie Dandy T, NP   2 years ago COVID   [facility] [place], Clydie Braun, NP   2 years ago Alcohol use disorder, moderate, dependence (HCC)   [place] [place], Dorie Rank, NP

## 2022-12-27 ENCOUNTER — Emergency Department
Admission: EM | Admit: 2022-12-27 | Discharge: 2022-12-27 | Disposition: A | Discharge status: Home / Self Care | Payer: BC Managed Care – PPO | Attending: Emergency Medicine | Admitting: Emergency Medicine

## 2022-12-27 ENCOUNTER — Ambulatory Visit: Payer: Self-pay | Admitting: *Deleted

## 2022-12-27 ENCOUNTER — Encounter: Payer: Self-pay | Admitting: Emergency Medicine

## 2022-12-27 ENCOUNTER — Emergency Department: Payer: BC Managed Care – PPO

## 2022-12-27 ENCOUNTER — Other Ambulatory Visit: Payer: Self-pay

## 2022-12-27 DIAGNOSIS — Z96653 Presence of artificial knee joint, bilateral: Secondary | ICD-10-CM | POA: Diagnosis not present

## 2022-12-27 DIAGNOSIS — I10 Essential (primary) hypertension: Secondary | ICD-10-CM | POA: Diagnosis not present

## 2022-12-27 DIAGNOSIS — R062 Wheezing: Secondary | ICD-10-CM | POA: Diagnosis present

## 2022-12-27 DIAGNOSIS — E039 Hypothyroidism, unspecified: Secondary | ICD-10-CM | POA: Insufficient documentation

## 2022-12-27 DIAGNOSIS — Z1152 Encounter for screening for COVID-19: Secondary | ICD-10-CM | POA: Diagnosis not present

## 2022-12-27 DIAGNOSIS — J4531 Mild persistent asthma with (acute) exacerbation: Secondary | ICD-10-CM | POA: Diagnosis not present

## 2022-12-27 LAB — RESP PANEL BY RT-PCR (RSV, FLU A&B, COVID)  RVPGX2
Influenza A by PCR: NEGATIVE
Influenza B by PCR: NEGATIVE
Resp Syncytial Virus by PCR: NEGATIVE
SARS Coronavirus 2 by RT PCR: NEGATIVE

## 2022-12-27 MED ORDER — IPRATROPIUM-ALBUTEROL 0.5-2.5 (3) MG/3ML IN SOLN
3.0000 mL | Freq: Once | RESPIRATORY_TRACT | Status: AC
  Administered 2022-12-27: 3 mL via RESPIRATORY_TRACT
  Filled 2022-12-27: qty 3

## 2022-12-27 MED ORDER — IPRATROPIUM-ALBUTEROL 0.5-2.5 (3) MG/3ML IN SOLN: 6.0000 mL | Freq: Once | RESPIRATORY_TRACT | Status: AC

## 2022-12-27 MED ORDER — ALBUTEROL SULFATE (2.5 MG/3ML) 0.083% IN NEBU: 2.5000 mg | INHALATION_SOLUTION | Freq: Four times a day (QID) | RESPIRATORY_TRACT | 2 refills | Status: DC | PRN

## 2022-12-27 MED ORDER — METHYLPREDNISOLONE SODIUM SUCC 125 MG IJ SOLR
125.0000 mg | Freq: Once | INTRAMUSCULAR | Status: AC
  Administered 2022-12-27: 125 mg via INTRAVENOUS
  Filled 2022-12-27: qty 2

## 2022-12-27 MED ORDER — PREDNISONE 50 MG PO TABS: ORAL_TABLET | ORAL | 0 refills | Status: DC

## 2022-12-27 MED ORDER — IPRATROPIUM-ALBUTEROL 0.5-2.5 (3) MG/3ML IN SOLN
RESPIRATORY_TRACT | Status: AC
  Administered 2022-12-27: 6 mL via RESPIRATORY_TRACT
  Filled 2022-12-27: qty 9

## 2022-12-27 NOTE — ED Provider Notes (Signed)
[facility] Provider Note    Event Date/Time   First MD Initiated Contact with Patient 12/27/22 [phone]     (approximate)   History   Asthma   HPI  Jessica Brennan is a 57 y.o. female with a past medical history of asthma, obesity, hypertension, hyperlipidemia who presents today with complaints of asthma.  Patient reports that she has never had to be intubated from asthma before.  She reports that her symptoms began as upper respiratory tract infection symptoms, but her asthma has not improved.  She reports that this happens to her every year.  She reports that she was given inhalers which she picked up yesterday which have not helped her.  She also notes that she started prednisone 4 days ago which has provided minimal relief.  She has not had any calf pain or leg swelling.  She denies history of PE or DVT.  No fevers or chills.  No chest pain or pleurisy.  Patient Active Problem List   Diagnosis Date Noted   Elevated hemoglobin A1c measurement 09/04/2020   Alcohol use disorder, moderate, dependence (HCC) 06/17/2020   S/P TKR (total knee replacement) using cement, left 11/20/2018   S/P TKR (total knee replacement) using cement, right 09/18/2018   Hypertension 03/30/2016   Hypothyroidism 01/12/2015   Depression 10/07/2014   Vitamin D deficiency 10/07/2014   Morbid obesity (HCC) 10/07/2014   Sleep apnea 10/07/2014   Hyperlipidemia 10/07/2014          Physical Exam   Triage Vital Signs: ED Triage Vitals  Encounter Vitals Group     BP 12/27/22 0937 133/69     Systolic BP Percentile --      Diastolic BP Percentile --      Pulse Rate 12/27/22 0934 100     Resp 12/27/22 0934 20     Temp 12/27/22 0934 98.4 F (36.9 C)     Temp Source 12/27/22 0934 Oral     SpO2 12/27/22 0934 94 %     Weight 12/27/22 0935 253 lb 8.5 oz (115 kg)     Height 12/27/22 0935 5\' 2"  (1.575 m)     Head Circumference --      Peak Flow --      Pain Score 12/27/22 0935 0      Pain Loc --      Pain Education --      Exclude from Growth Chart --     Most recent vital signs: Vitals:   12/27/22 0934 12/27/22 0937  BP:  133/69  Pulse: 100   Resp: 20   Temp: 98.4 F (36.9 C)   SpO2: 94%     Physical Exam Vitals and nursing note reviewed.  Constitutional:      General: Awake and alert. No acute distress.    Appearance: Normal appearance. The patient is obese.  HENT:     Head: Normocephalic and atraumatic.     Mouth: Mucous membranes are moist.  Eyes:     General: PERRL. Normal EOMs        Right eye: No discharge.        Left eye: No discharge.     Conjunctiva/sclera: Conjunctivae normal.  Cardiovascular:     Rate and Rhythm: Normal rate and regular rhythm.     Pulses: Normal pulses.  Pulmonary:     Effort: Pulmonary effort is normal. No respiratory distress.  No accessory muscle use.  Able to speak easily in complete sentences  Breath sounds: Inspiratory and expiratory wheezes bilaterally Abdominal:     Abdomen is soft. There is no abdominal tenderness. No rebound or guarding. No distention. Musculoskeletal:        General: No swelling. Normal range of motion.     Cervical back: Normal range of motion and neck supple.  No calf pain or leg swelling Skin:    General: Skin is warm and dry.     Capillary Refill: Capillary refill takes less than 2 seconds.     Findings: No rash.  Neurological:     Mental Status: The patient is awake and alert.      ED Results / Procedures / Treatments   Labs (all labs ordered are listed, but only abnormal results are displayed) Labs Reviewed  RESP PANEL BY RT-PCR (RSV, FLU A&B, COVID)  [id number]     EKG     RADIOLOGY I independently reviewed and interpreted imaging and agree with radiologists findings.     PROCEDURES:  Critical Care performed:   Procedures   MEDICATIONS ORDERED IN ED: Medications  ipratropium-albuterol (DUONEB) 0.5-2.5 (3) MG/3ML nebulizer solution 6 mL (6 mLs Nebulization  Given 12/27/22 0938)  methylPREDNISolone sodium succinate (SOLU-MEDROL) 125 mg/2 mL injection 125 mg (125 mg Intravenous Given 12/27/22 1052)  ipratropium-albuterol (DUONEB) 0.5-2.5 (3) MG/3ML nebulizer solution 3 mL (3 mLs Nebulization Given 12/27/22 1053)  ipratropium-albuterol (DUONEB) 0.5-2.5 (3) MG/3ML nebulizer solution 3 mL (3 mLs Nebulization Given 12/27/22 1052)     IMPRESSION / MDM / ASSESSMENT AND PLAN / ED COURSE  I reviewed the triage vital signs and the nursing notes.   Differential diagnosis includes, but is not limited to, pneumonia, asthma exacerbation, bronchitis, COVID, flu.  Patient is awake and alert, hemodynamically stable and afebrile.  She has an oxygen saturation of 94% on room air when she arrived at triage.  She has no accessory muscle use and is able to speak easily in complete sentences, however she does have inspiratory and expiratory wheezes bilaterally.  She was given 1 DuoNeb prior to my evaluation.  Subsequently I gave her 2 more as well as a dose of Solu-Medrol.  Also recommended respiratory viral panel and chest x-ray.  She has no chest pain, pleurisy, hemoptysis, history of PE or DVT, recent periods of immobilization or travel, calf pain or leg swelling, I do not suspect DVT or PE today.  No pitting edema in her lower extremities, no JVD, no crackles, do not suspect acute heart failure.  Patient felt significantly improved after a total of 3 DuoNeb's and Solu-Medrol.  Her wheezes had improved significantly.  I do feel that she would benefit from continued prednisone for an additional 5 days which patient agrees with.  She reports that her PCP ordered her nebulizer machine, but she does not have the nebulizer solution, therefore this was ordered for her as well.  She had an oxygen saturation of 98% upon my reevaluation.  We discussed strict return precautions and the importance of outpatient follow-up.  Patient is in agreement with plan.  She was discharged in  stable condition.   Patient's presentation is most consistent with acute complicated illness / injury requiring diagnostic workup.   FINAL CLINICAL IMPRESSION(S) / ED DIAGNOSES   Final diagnoses:  Mild persistent asthma with exacerbation     Rx / DC Orders   ED Discharge Orders          Ordered    predniSONE (DELTASONE) 50 MG tablet        12/27/22  1156    albuterol (PROVENTIL) (2.5 MG/3ML) 0.083% nebulizer solution  Every 6 hours PRN        12/27/22 1156             Note:  This document was prepared using Dragon voice recognition software and may include unintentional dictation errors.   Jackelyn Hoehn, PA-C 12/27/22 1436    Jene Every, MD 12/27/22 1438

## 2022-12-27 NOTE — ED Triage Notes (Signed)
Pt reports that she has been having issues with her asthma since Saturday. Inhaler is not working, doesn't have nebs at home.

## 2022-12-27 NOTE — Telephone Encounter (Signed)
  Chief Complaint: SOB Symptoms: Cough, SOB, wheezing.  Audible wheezing during call. Speech faltering. SOB at rest. Productive cough Frequency: Saturday Pertinent Negatives: Patient denies fever Disposition: [x] ED /[] Urgent Care (no appt availability in office) / [] Appointment(In office/virtual)/ []  [place] [facility]/ [] Home Care/ [] Refused Recommended Disposition /[] [place] [facility]/ []  Follow-up with PCP Additional Notes: Seen Monday, on prednisone. States inhalers ineffective. Sounds distressed. Advised ED, will follow disposition. Reason for Disposition  [1] MODERATE difficulty breathing (e.g., speaks in phrases, SOB even at rest, pulse 100-120) AND [2] still present when not coughing  Answer Assessment - Initial Assessment Questions 1. ONSET: "When did the cough begin?"      Saturday 2. SEVERITY: "How bad is the cough today?"      Moderate 3. SPUTUM: "Describe the color of your sputum" (none, dry cough; clear, white, yellow, green)     Clear 4. HEMOPTYSIS: "Are you coughing up any blood?" If so ask: "How much?" (flecks, streaks, tablespoons, etc.)     no 5. DIFFICULTY BREATHING: "Are you having difficulty breathing?" If Yes, ask: "How bad is it?" (e.g., mild, moderate, severe)    - MILD: No SOB at rest, mild SOB with walking, speaks normally in sentences, can lie down, no retractions, pulse < 100.    - MODERATE: SOB at rest, SOB with minimal exertion and prefers to sit, cannot lie down flat, speaks in phrases, mild retractions, audible wheezing, pulse 100-120.    - SEVERE: Very SOB at rest, speaks in single words, struggling to breathe, sitting hunched forward, retractions, pulse > 120      moderate 6. FEVER: "Do you have a fever?" If Yes, ask: "What is your temperature, how was it measured, and when did it start?"     No  8. LUNG HISTORY: "Do you have any history of lung disease?"  (e.g., pulmonary embolus, asthma, emphysema)     Asthma  10. OTHER SYMPTOMS: "Do you  have any other symptoms?" (e.g., runny nose, wheezing, chest pain)       Wheezing, stuffy nose  Protocols used: Cough - Acute Productive-A-AH

## 2022-12-27 NOTE — Discharge Instructions (Addendum)
Your chest x-ray does not show pneumonia.  You were given steroids and nebulizers in the emergency department.  You were given a refill of nebulizer solution, and a few more days of the prednisone.  You may start taking this tomorrow.  Please return to the emergency department for any new, worsening, or change in symptoms or other concerns.  It was a pleasure caring for you today.

## 2022-12-27 NOTE — Telephone Encounter (Signed)
Agree with ER due to SOB with rest and difficulty speaking due to SOB.

## 2022-12-27 NOTE — ED Notes (Signed)
Tolerating treatment well.  States breathing improving.

## 2023-01-02 ENCOUNTER — Telehealth: Payer: Self-pay | Admitting: *Deleted

## 2023-01-02 NOTE — Telephone Encounter (Signed)
Patient called to schedule hospital follow up and verify her lab results- all respiratory labs were negative and x ray did not show pneumonia. Patient will follow up with provider tomorrow in office.

## 2023-01-03 ENCOUNTER — Ambulatory Visit: Payer: BC Managed Care – PPO | Admitting: Nurse Practitioner

## 2023-01-07 ENCOUNTER — Ambulatory Visit (INDEPENDENT_AMBULATORY_CARE_PROVIDER_SITE_OTHER): Payer: BC Managed Care – PPO | Admitting: Family Medicine

## 2023-01-07 VITALS — BP 144/83 | HR 115 | Temp 98.0°F | Ht 62.99 in | Wt 258.4 lb

## 2023-01-07 DIAGNOSIS — J4531 Mild persistent asthma with (acute) exacerbation: Secondary | ICD-10-CM | POA: Diagnosis not present

## 2023-01-07 MED ORDER — ALBUTEROL SULFATE (2.5 MG/3ML) 0.083% IN NEBU: 2.5000 mg | INHALATION_SOLUTION | Freq: Four times a day (QID) | RESPIRATORY_TRACT | 2 refills | Status: AC | PRN

## 2023-01-07 NOTE — Patient Instructions (Addendum)
Take Zyrtec daily, if no improvement over the next 3 days try mucinex Use Red inhaler twice daily  Use the white inhaler as needed for shortness of breath or wheezing.  Use the white inhaler before exercising.  Start taking Singulair [facility] DASH diet

## 2023-01-07 NOTE — Progress Notes (Unsigned)
BP (!) 144/83   Pulse (!) 115   Temp 98 F (36.7 C) (Oral)   Ht 5' 2.99" (1.6 m)   Wt 258 lb 6.4 oz (117.2 kg)   LMP 12/24/2012 (Exact Date)   SpO2 97%   BMI 45.79 kg/m    Subjective:    Patient ID: Jessica Brennan, female    DOB: Jul 01, 1965, 57 y.o.   MRN: [id number]  HPI: Jessica Brennan is a 57 y.o. female  Chief Complaint  Patient presents with   Follow-up  Elevated BP in office with bilateral lower extremeity edema: Taking Valsartan 40 MG, educated on DASH diet during this visit today. Denies, headache, light headedness, dizziness, visual disturbances.   ER FOLLOW UP She states she has been taking her Symbicort twice daily and using Air supra every 4 hours, using this x3 times daily and denies feeling short of breath when using, may have misunderstanding of inhaler use. Oxygen is 97% today.  Time since discharge: 12/27/2022: 11 days Hospital/facility: [facility] Diagnosis:  Mild persistent asthma with exacerbation  Procedures/tests: Respiratory viral panel and chest xray  Consultants: None New medications: Prednisone 50 MG for 5 days and albuterol nebulizer solution every 6 hours PRN.  Discharge instructions:  Complete 5 day course of Prednisone Status: stable She feels better since her initial visit at the hospital.   She completed the prednisone on Thursday. She explains she still has some chest congestion and drainage in her nose, she feels her voice has gotten hoarse.   Relevant past medical, surgical, family and social history reviewed and updated as indicated. Interim medical history since our last visit reviewed. Allergies and medications reviewed and updated.  Review of Systems  Constitutional:  Negative for chills and fever.  HENT:  Positive for congestion and postnasal drip. Negative for rhinorrhea, sinus pressure, sinus pain, sneezing and sore throat.   Eyes:  Negative for visual disturbance.  Respiratory:  Negative for apnea,  cough, choking, chest tightness, shortness of breath, wheezing and stridor.   Cardiovascular:  Positive for leg swelling. Negative for chest pain and palpitations.  Neurological:  Negative for dizziness, light-headedness and headaches.    Per HPI unless specifically indicated above     Objective:    BP (!) 144/83   Pulse (!) 115   Temp 98 F (36.7 C) (Oral)   Ht 5' 2.99" (1.6 m)   Wt 258 lb 6.4 oz (117.2 kg)   LMP 12/24/2012 (Exact Date)   SpO2 97%   BMI 45.79 kg/m   Wt Readings from Last 3 Encounters:  01/07/23 258 lb 6.4 oz (117.2 kg)  12/27/22 253 lb 8.5 oz (115 kg)  12/24/22 255 lb (115.7 kg)    Physical Exam Vitals and nursing note reviewed.  Constitutional:      General: She is awake. She is not in acute distress.    Appearance: Normal appearance. She is well-developed and well-groomed. She is morbidly obese. She is not ill-appearing.  HENT:     Head: Normocephalic and atraumatic.     Right Ear: Hearing and external ear normal. No drainage.     Left Ear: Hearing and external ear normal. No drainage.     Nose: Nose normal.  Eyes:     General: Lids are normal.        Right eye: No discharge.        Left eye: No discharge.     Conjunctiva/sclera: Conjunctivae normal.  Cardiovascular:     Rate and Rhythm:  Regular rhythm. Tachycardia present.     Heart sounds: Normal heart sounds, S1 normal and S2 normal. No murmur heard.    No gallop.  Pulmonary:     Effort: Pulmonary effort is normal. No accessory muscle usage or respiratory distress.     Breath sounds: Normal breath sounds. No wheezing, rhonchi or rales.  Musculoskeletal:        General: Normal range of motion.     Cervical back: Full passive range of motion without pain and normal range of motion.     Right lower leg: No edema.     Left lower leg: No edema.  Skin:    General: Skin is warm and dry.     Capillary Refill: Capillary refill takes less than 2 seconds.  Neurological:     Mental Status: She is  alert and oriented to person, place, and time.  Psychiatric:        Attention and Perception: Attention normal.        Mood and Affect: Mood normal.        Speech: Speech normal.        Behavior: Behavior normal. Behavior is cooperative.        Thought Content: Thought content normal.     Results for orders placed or performed during the hospital encounter of 12/27/22  Resp panel by RT-PCR (RSV, Flu A&B, Covid) Anterior Nasal Swab   Specimen: Anterior Nasal Swab  Result Value Ref Range   SARS Coronavirus 2 by RT PCR NEGATIVE NEGATIVE   Influenza A by PCR NEGATIVE NEGATIVE   Influenza B by PCR NEGATIVE NEGATIVE   Resp Syncytial Virus by PCR NEGATIVE NEGATIVE      Assessment & Plan:   Problem List Items Addressed This Visit     Mild persistent asthma with exacerbation - Primary    Acute, stable. Doing better since ED admission, completed 5 day course of Prednisone. Referral placed for pulmonology per patient request. Refill given for albuterol solution as patient is awaiting her nebulizer to arrive. Continue using Symbicort twice daily and air supra inhaler every 4-6 hours PRN, educated that air supra is a rescue inhaler and Symbicort is maintenance. Recommend taking Singulair daily and added Zyrtec to help with triggers. Return in 3 weeks, call sooner if concerns arise.       Relevant Medications   albuterol (PROVENTIL) (2.5 MG/3ML) 0.083% nebulizer solution   Other Relevant Orders   Ambulatory referral to Pulmonology     Follow up plan: Return in about 3 weeks (around 01/28/2023) for Follow up physical, bp, labs, mood.

## 2023-01-09 DIAGNOSIS — J4530 Mild persistent asthma, uncomplicated: Secondary | ICD-10-CM | POA: Insufficient documentation

## 2023-01-09 DIAGNOSIS — J4531 Mild persistent asthma with (acute) exacerbation: Secondary | ICD-10-CM | POA: Insufficient documentation

## 2023-01-09 NOTE — Assessment & Plan Note (Addendum)
Acute, stable. Doing better since ED admission, completed 5 day course of Prednisone. Referral placed for pulmonology per patient request. Refill given for albuterol solution as patient is awaiting her nebulizer to arrive. Continue using Symbicort twice daily and air supra inhaler every 4-6 hours PRN, educated that air supra is a rescue inhaler and Symbicort is maintenance. Recommend taking Singulair daily and added Zyrtec to help with triggers. Return in 3 weeks, call sooner if concerns arise.

## 2023-01-11 ENCOUNTER — Encounter: Payer: Self-pay | Admitting: Pulmonary Disease

## 2023-01-15 ENCOUNTER — Other Ambulatory Visit: Payer: Self-pay | Admitting: Family Medicine

## 2023-01-15 DIAGNOSIS — J302 Other seasonal allergic rhinitis: Secondary | ICD-10-CM

## 2023-01-23 ENCOUNTER — Ambulatory Visit: Payer: Self-pay

## 2023-01-23 NOTE — Telephone Encounter (Signed)
Chief Complaint: Diarrhea Symptoms: nausea sometimes, vomited x 5 yesterday w/no vomiting today, diarrhea 4-5 times a day since onset, able to tolerate liquids (64 oz past 24 hours) Frequency: Onset 2 days ago Pertinent Negatives: Patient denies abdominal pain Disposition: [] ED /[] Urgent Care (no appt availability in office) / [x] Appointment(In office/virtual)/ []  [place] [facility]/ [] Home Care/ [x] Refused Recommended Disposition /[]  Mobile Bus/ []  Follow-up with PCP Additional Notes: Patient advised she will need to be evaluated. She says she doesn't want to come in the office or do a virtual visit due to she can't afford it. She asked is this something that is going around and how long will it last is all she wants to know. Advised this could be viral, care advice given, advised ED if symptoms do not improve. Advised I will be sending this to [facility] and if there are any other recommendations someone will call. Patient verbalized understanding.    Reason for Disposition  [1] MODERATE diarrhea (e.g., 4-6 times / day more than normal) AND [2] present > 48 hours (2 days)  Answer Assessment - Initial Assessment Questions 1. DIARRHEA SEVERITY: "How bad is the diarrhea?" "How many more stools have you had in the past 24 hours than normal?"    - NO DIARRHEA (SCALE 0)   - MILD (SCALE 1-3): Few loose or mushy BMs; increase of 1-3 stools over normal daily number of stools; mild increase in ostomy output.   -  MODERATE (SCALE 4-7): Increase of 4-6 stools daily over normal; moderate increase in ostomy output.   -  SEVERE (SCALE 8-10; OR "WORST POSSIBLE"): Increase of 7 or more stools daily over normal; moderate increase in ostomy output; incontinence.     4-5 times a day 2. ONSET: "When did the diarrhea begin?"      2 days ago 3. BM CONSISTENCY: "How loose or watery is the diarrhea?"      Watery brown 4. VOMITING: "Are you also vomiting?" If Yes, ask: "How many times in the past 24  hours?"      Not today, but vomited x 5 yesterday over a couple hours 5. ABDOMEN PAIN: "Are you having any abdomen pain?" If Yes, ask: "What does it feel like?" (e.g., crampy, dull, intermittent, constant)      No 6. ABDOMEN PAIN SEVERITY: If present, ask: "How bad is the pain?"  (e.g., Scale 1-10; mild, moderate, or severe)   - MILD (1-3): doesn't interfere with normal activities, abdomen soft and not tender to touch    - MODERATE (4-7): interferes with normal activities or awakens from sleep, abdomen tender to touch    - SEVERE (8-10): excruciating pain, doubled over, unable to do any normal activities       No pain 7. ORAL INTAKE: If vomiting, "Have you been able to drink liquids?" "How much liquids have you had in the past 24 hours?"     64 oz liquid since yesterday 8. HYDRATION: "Any signs of dehydration?" (e.g., dry mouth [not just dry lips], too weak to stand, dizziness, new weight loss) "When did you last urinate?"     No 9. 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 ANTIBIOTIC USE: "Are you taking antibiotics now or have you taken antibiotics in the past 2 months?"  No 11. OTHER SYMPTOMS: "Do you have any other symptoms?" (e.g., fever, blood in stool)       Nausea sometimes  Protocols used: Diarrhea-A-AH

## 2023-01-23 NOTE — Telephone Encounter (Signed)
Called and LVM notifying patient of Jolene's message. Asked for patient to please return my call if she has any questions or concerns.

## 2023-01-29 ENCOUNTER — Other Ambulatory Visit: Payer: Self-pay | Admitting: Nurse Practitioner

## 2023-01-29 NOTE — Telephone Encounter (Signed)
Medication Refill -  Most Recent Primary Care Visit:  Provider: Weber Cooks  Department: [facility]  Visit Type: OFFICE VISIT  Date: 01/07/2023  Medication: atorvastatin (LIPITOR) 20 MG tablet [[id number]   Has the patient contacted their pharmacy? Yes Is this the correct pharmacy for this prescription? Yes If no, delete pharmacy and type the correct one.  This is the patient's preferred pharmacy:  [id number] [place], [place] - [address] [place] [place] [address] Phone: [phone] Fax: [phone]     Has the prescription been filled recently? Yes  Is the patient out of the medication? Yes  Has the patient been seen for an appointment in the last year OR does the patient have an upcoming appointment? Yes  Can we respond through MyChart? Yes  Agent: Please be advised that Rx refills may take up to 3 business days. We ask that you follow-up with your pharmacy.

## 2023-01-31 MED ORDER — ATORVASTATIN CALCIUM 20 MG PO TABS: 20.0000 mg | ORAL_TABLET | Freq: Every day | ORAL | 3 refills | Status: DC

## 2023-01-31 NOTE — Telephone Encounter (Signed)
[facility] Medication Refill  Last appointment for hypertension management within 12 months (mandatory) YES  Last 3 in-person BP readings BP Readings from Last 3 Encounters:  01/07/23 (!) 144/83  12/27/22 133/69  12/24/22 130/70     Last office visit at [facility]: 01/07/2023   Next office visit at [facility]: 02/06/2023

## 2023-01-31 NOTE — Telephone Encounter (Signed)
Requested medication (s) are due for refill today: yes   Requested medication (s) are on the active medication list: yes   Last refill:  08/27/22 #90 3 refills   Future visit scheduled: yes in 6 days  Notes to clinic:  protocol failed last labs 12/19/21. Do you want to refill Rx?     Requested Prescriptions  Pending Prescriptions Disp Refills   atorvastatin (LIPITOR) 20 MG tablet 90 tablet 3    Sig: Take 1 tablet (20 mg total) by mouth daily.     Cardiovascular:  Antilipid - Statins Failed - 01/29/2023  4:40 PM      Failed - Lipid Panel in normal range within the last 12 months    Cholesterol, Total  Date Value Ref Range Status  12/19/2021 222 (H) 100 - 199 mg/dL Final   Cholesterol Piccolo, Waived  Date Value Ref Range Status  08/19/2018 132 <200 mg/dL Final    Comment:                            Desirable                <200                         Borderline High      200- 239                         High                     >239    LDL Chol Calc (NIH)  Date Value Ref Range Status  12/19/2021 98 0 - 99 mg/dL Final   LDL Direct  Date Value Ref Range Status  10/17/2017 200 (H) 0 - 99 mg/dL Final   HDL  Date Value Ref Range Status  12/19/2021 113 >39 mg/dL Final   Triglycerides  Date Value Ref Range Status  12/19/2021 62 0 - 149 mg/dL Final   Triglycerides Piccolo,Waived  Date Value Ref Range Status  08/19/2018 174 (H) <150 mg/dL Final    Comment:                            Normal                   <150                         Borderline High     150 - 199                         High                200 - 499                         Very High                >499          Passed - Patient is not pregnant      Passed - Valid encounter within last 12 months    Recent Outpatient Visits           3 weeks ago Mild persistent asthma with exacerbation   [facility] Pear[place], Sherran Needs, NP   1 month ago Upper respiratory tract  infection, unspecified type   [facility] Danelle Berry, PA-C   1 year ago Current severe episode of major depressive disorder without psychotic features without prior episode [facility])   [facility] [place], Dorie Rank, NP   1 year ago Morbid obesity [facility])   [place] [facility] Aura Dials T, NP   2 years ago COVID   [place] Larae Grooms, NP       Future Appointments             In 6 days Cannady, Dorie Rank, NP [facility]

## 2023-02-03 NOTE — Patient Instructions (Incomplete)
Prediabetes Eating Plan Prediabetes is a condition that causes blood sugar (glucose) levels to be higher than normal. This increases the risk for developing type 2 diabetes (type 2 diabetes mellitus). Working with a health care provider or nutrition specialist (dietitian) to make diet and lifestyle changes can help prevent the onset of diabetes. These changes may help you: Control your blood glucose levels. Improve your cholesterol levels. Manage your blood pressure. What are tips for following this plan? Reading food labels Read food labels to check the amount of fat, salt (sodium), and sugar in prepackaged foods. Avoid foods that have: Saturated fats. Trans fats. Added sugars. Avoid foods that have more than 300 milligrams (mg) of sodium per serving. Limit your sodium intake to less than 2,300 mg each day. Shopping Avoid buying pre-made and processed foods. Avoid buying drinks with added sugar. Cooking Cook with olive oil. Do not use butter, lard, or ghee. Bake, broil, grill, steam, or boil foods. Avoid frying. Meal planning  Work with your dietitian to create an eating plan that is right for you. This may include tracking how many calories you take in each day. Use a food diary, notebook, or mobile application to track what you eat at each meal. Consider following a Mediterranean diet. This includes: Eating several servings of fresh fruits and vegetables each day. Eating fish at least twice a week. Eating one serving each day of whole grains, beans, nuts, and seeds. Using olive oil instead of other fats. Limiting alcohol. Limiting red meat. Using nonfat or low-fat dairy products. Consider following a plant-based diet. This includes dietary choices that focus on eating mostly vegetables and fruit, grains, beans, nuts, and seeds. If you have high blood pressure, you may need to limit your sodium intake or follow a diet such as the DASH (Dietary Approaches to Stop Hypertension) eating  plan. The DASH diet aims to lower high blood pressure. Lifestyle Set weight loss goals with help from your health care team. It is recommended that most people with prediabetes lose 7% of their body weight. Exercise for at least 30 minutes 5 or more days a week. Attend a support group or seek support from a mental health counselor. Take over-the-counter and prescription medicines only as told by your health care provider. What foods are recommended? Fruits Berries. Bananas. Apples. Oranges. Grapes. Papaya. Mango. Pomegranate. Kiwi. Grapefruit. Cherries. Vegetables Lettuce. Spinach. Peas. Beets. Cauliflower. Cabbage. Broccoli. Carrots. Tomatoes. Squash. Eggplant. Herbs. Peppers. Onions. Cucumbers. Brussels sprouts. Grains Whole grains, such as whole-wheat or whole-grain breads, crackers, cereals, and pasta. Unsweetened oatmeal. Bulgur. Barley. Quinoa. Brown rice. Corn or whole-wheat flour tortillas or taco shells. Meats and other proteins Seafood. Poultry without skin. Lean cuts of pork and beef. Tofu. Eggs. Nuts. Beans. Dairy Low-fat or fat-free dairy products, such as yogurt, cottage cheese, and cheese. Beverages Water. Tea. Coffee. Sugar-free or diet soda. Seltzer water. Low-fat or nonfat milk. Milk alternatives, such as soy or almond milk. Fats and oils Olive oil. Canola oil. Sunflower oil. Grapeseed oil. Avocado. Walnuts. Sweets and desserts Sugar-free or low-fat pudding. Sugar-free or low-fat ice cream and other frozen treats. Seasonings and condiments Herbs. Sodium-free spices. Mustard. Relish. Low-salt, low-sugar ketchup. Low-salt, low-sugar barbecue sauce. Low-fat or fat-free mayonnaise. The items listed above may not be a complete list of recommended foods and beverages. Contact a dietitian for more information. What foods are not recommended? Fruits Fruits canned with syrup. Vegetables Canned vegetables. Frozen vegetables with butter or cream sauce. Grains Refined white  flour and flour   products, such as bread, pasta, snack foods, and cereals. Meats and other proteins Fatty cuts of meat. Poultry with skin. Breaded or fried meat. Processed meats. Dairy Full-fat yogurt, cheese, or milk. Beverages Sweetened drinks, such as iced tea and soda. Fats and oils Butter. Lard. Ghee. Sweets and desserts Baked goods, such as cake, cupcakes, pastries, cookies, and cheesecake. Seasonings and condiments Spice mixes with added salt. Ketchup. Barbecue sauce. Mayonnaise. The items listed above may not be a complete list of foods and beverages that are not recommended. Contact a dietitian for more information. Where to find more information American Diabetes Association: www.diabetes.org Summary You may need to make diet and lifestyle changes to help prevent the onset of diabetes. These changes can help you control blood sugar, improve cholesterol levels, and manage blood pressure. Set weight loss goals with help from your health care team. It is recommended that most people with prediabetes lose 7% of their body weight. Consider following a Mediterranean diet. This includes eating plenty of fresh fruits and vegetables, whole grains, beans, nuts, seeds, fish, and low-fat dairy, and using olive oil instead of other fats. This information is not intended to replace advice given to you by your health care provider. Make sure you discuss any questions you have with your health care provider. Document Revised: 05/14/2019 Document Reviewed: 05/14/2019 Elsevier Patient Education  2024 Elsevier Inc.  

## 2023-02-06 ENCOUNTER — Encounter: Payer: BC Managed Care – PPO | Admitting: Nurse Practitioner

## 2023-02-06 DIAGNOSIS — E782 Mixed hyperlipidemia: Secondary | ICD-10-CM

## 2023-02-06 DIAGNOSIS — I10 Essential (primary) hypertension: Secondary | ICD-10-CM

## 2023-02-06 DIAGNOSIS — F322 Major depressive disorder, single episode, severe without psychotic features: Secondary | ICD-10-CM

## 2023-02-06 DIAGNOSIS — E039 Hypothyroidism, unspecified: Secondary | ICD-10-CM

## 2023-02-06 DIAGNOSIS — G4733 Obstructive sleep apnea (adult) (pediatric): Secondary | ICD-10-CM

## 2023-02-06 DIAGNOSIS — J4531 Mild persistent asthma with (acute) exacerbation: Secondary | ICD-10-CM

## 2023-02-06 DIAGNOSIS — Z1231 Encounter for screening mammogram for malignant neoplasm of breast: Secondary | ICD-10-CM

## 2023-02-06 DIAGNOSIS — Z1211 Encounter for screening for malignant neoplasm of colon: Secondary | ICD-10-CM

## 2023-02-06 DIAGNOSIS — R7309 Other abnormal glucose: Secondary | ICD-10-CM

## 2023-02-06 DIAGNOSIS — F1020 Alcohol dependence, uncomplicated: Secondary | ICD-10-CM

## 2023-02-06 DIAGNOSIS — Z23 Encounter for immunization: Secondary | ICD-10-CM

## 2023-02-06 DIAGNOSIS — E559 Vitamin D deficiency, unspecified: Secondary | ICD-10-CM

## 2023-02-06 DIAGNOSIS — Z0000 Encounter for general adult medical examination without abnormal findings: Secondary | ICD-10-CM

## 2023-02-11 ENCOUNTER — Institutional Professional Consult (permissible substitution): Payer: BC Managed Care – PPO | Admitting: Pulmonary Disease

## 2023-03-20 ENCOUNTER — Encounter: Payer: 59 | Admitting: Nurse Practitioner

## 2023-03-20 DIAGNOSIS — Z23 Encounter for immunization: Secondary | ICD-10-CM

## 2023-03-20 DIAGNOSIS — Z1211 Encounter for screening for malignant neoplasm of colon: Secondary | ICD-10-CM

## 2023-03-20 DIAGNOSIS — F322 Major depressive disorder, single episode, severe without psychotic features: Secondary | ICD-10-CM

## 2023-03-20 DIAGNOSIS — I10 Essential (primary) hypertension: Secondary | ICD-10-CM

## 2023-03-20 DIAGNOSIS — R7309 Other abnormal glucose: Secondary | ICD-10-CM

## 2023-03-20 DIAGNOSIS — E559 Vitamin D deficiency, unspecified: Secondary | ICD-10-CM

## 2023-03-20 DIAGNOSIS — Z0000 Encounter for general adult medical examination without abnormal findings: Secondary | ICD-10-CM

## 2023-03-20 DIAGNOSIS — Z1231 Encounter for screening mammogram for malignant neoplasm of breast: Secondary | ICD-10-CM

## 2023-03-20 DIAGNOSIS — E039 Hypothyroidism, unspecified: Secondary | ICD-10-CM

## 2023-03-20 DIAGNOSIS — E782 Mixed hyperlipidemia: Secondary | ICD-10-CM

## 2023-03-20 DIAGNOSIS — J4531 Mild persistent asthma with (acute) exacerbation: Secondary | ICD-10-CM

## 2023-03-20 DIAGNOSIS — F1020 Alcohol dependence, uncomplicated: Secondary | ICD-10-CM

## 2023-04-03 ENCOUNTER — Ambulatory Visit: Payer: Self-pay | Admitting: *Deleted

## 2023-04-03 NOTE — Telephone Encounter (Signed)
  Chief Complaint: Wanting to know if it's ok for her to donate plasma.   See pt's note. Symptoms: N/A Frequency: N/A Pertinent Negatives: Patient denies N/A Disposition: [] ED /[] Urgent Care (no appt availability in office) / [] Appointment(In office/virtual)/ []  [place] [facility]/ [] Home Care/ [] Refused Recommended Disposition /[] [place] Mobile Bus/ [x]  Follow-up with PCP Additional Notes: Message sent to Jolene Cannady, NP

## 2023-04-03 NOTE — Telephone Encounter (Signed)
 Message from [place] F sent at 04/03/2023 12:17 PM EST  Summary: [name]   Pt is calling in because she wants to know if it is okay for her to donate plasma. Pt says when she was younger she donated plasma during her menstrual cycle and had a donor reaction and wanted to know because she no longer has cycles would it be okay for her to try and donate again. Please advise          Call History  Contact Date/Time Type Contact Phone/Fax By  04/03/2023 12:15 PM EST Phone (Incoming) Jessica Brennan, Jessica Brennan (Self) [phone] MIKE) Epifanio Grant M   Reason for Disposition  [1] Caller requesting NON-URGENT health information AND [2] PCP's office is the best resource    See pt's note  Answer Assessment - Initial Assessment Questions 1. REASON FOR CALL or QUESTION: What is your reason for calling today? or How can I best help you? or What question do you have that I can help answer?     See pt's message.    She is wanting to know if it's ok to donate plasma.  Protocols used: Information Only Call - No Triage-A-AH

## 2023-04-04 NOTE — Telephone Encounter (Signed)
 Called and notified patient of Jolene's message.

## 2023-07-28 ENCOUNTER — Other Ambulatory Visit: Payer: Self-pay | Admitting: Nurse Practitioner

## 2023-07-28 DIAGNOSIS — E039 Hypothyroidism, unspecified: Secondary | ICD-10-CM

## 2023-07-29 NOTE — Telephone Encounter (Signed)
 Requested medication (s) are due for refill today: yes  Requested medication (s) are on the active medication list: yes  Last refill:  07/16/22 #90/4  Future visit scheduled: no  Notes to clinic:  Unable to refill per protocol due to failed labs, no updated results. Pt has had several previous no shows for appt      Requested Prescriptions  Pending Prescriptions Disp Refills   levothyroxine  (SYNTHROID ) 75 MCG tablet [Pharmacy Med Name: LEVOTHYROXINE  75 MCG TABLET] 90 tablet 4    Sig: TAKE 1 TABLET BY MOUTH EVERY DAY BEFORE BREAKFAST     Endocrinology:  Hypothyroid Agents Failed - 07/29/2023  4:29 PM      Failed - TSH in normal range and within 360 days    TSH  Date Value Ref Range Status  06/05/2021 3.530 0.450 - 4.500 uIU/mL Final         Failed - Valid encounter within last 12 months    Recent Outpatient Visits   None

## 2023-07-29 NOTE — Telephone Encounter (Signed)
 Patient is overdue for an appointment. Please call to schedule and then route to provider for possible courtesy refill.

## 2023-07-30 NOTE — Telephone Encounter (Signed)
 Appt scheduled

## 2023-08-10 NOTE — Patient Instructions (Incomplete)

## 2023-08-14 ENCOUNTER — Ambulatory Visit: Payer: Self-pay | Admitting: Nurse Practitioner

## 2023-08-17 NOTE — Patient Instructions (Incomplete)
 Managing Depression, Adult Depression is a mental health condition that affects your thoughts, feelings, and actions. Being diagnosed with depression can bring you relief if you did not know why you have felt or behaved a certain way. It could also leave you feeling overwhelmed. Finding ways to manage your symptoms can help you feel more positive about your future. How to manage lifestyle changes Being depressed is difficult. Depression can increase the level of everyday stress. Stress can make depression symptoms worse. You may believe your symptoms cannot be managed or will never improve. However, there are many things you can try to help manage your symptoms. There is hope. Managing stress  Stress is your body's reaction to life changes and events, both good and bad. Stress can add to your feelings of depression. Learning to manage your stress can help lessen your feelings of depression. Try some of the following approaches to reducing your stress (stress reduction techniques): Listen to music that you enjoy and that inspires you. Try using a meditation app or take a meditation class. Develop a practice that helps you connect with your spiritual self. Walk in nature, pray, or go to a place of worship. Practice deep breathing. To do this, inhale slowly through your nose. Pause at the top of your inhale for a few seconds and then exhale slowly, letting yourself relax. Repeat this three or four times. Practice yoga to help relax and work your muscles. Choose a stress reduction technique that works for you. These techniques take time and practice to develop. Set aside 5-15 minutes a day to do them. Therapists can offer training in these techniques. Do these things to help manage stress: Keep a journal. Know your limits. Set healthy boundaries for yourself and others, such as saying "no" when you think something is too much. Pay attention to how you react to certain situations. You may not be able to  control everything, but you can change your reaction. Add humor to your life by watching funny movies or shows. Make time for activities that you enjoy and that relax you. Spend less time using electronics, especially at night before bed. The light from screens can make your brain think it is time to get up rather than go to bed.  Medicines Medicines, such as antidepressants, are often a part of treatment for depression. Talk with your pharmacist or health care provider about all the medicines, supplements, and herbal products that you take, their possible side effects, and what medicines and other products are safe to take together. Make sure to report any side effects you may have to your health care provider. Relationships Your health care provider may suggest family therapy, couples therapy, or individual therapy as part of your treatment. How to recognize changes Everyone responds differently to treatment for depression. As you recover from depression, you may start to: Have more interest in doing activities. Feel more hopeful. Have more energy. Eat a more regular amount of food. Have better mental focus. It is important to recognize if your depression is not getting better or is getting worse. The symptoms you had in the beginning may return, such as: Feeling tired. Eating too much or too little. Sleeping too much or too little. Feeling restless, agitated, or hopeless. Trouble focusing or making decisions. Having unexplained aches and pains. Feeling irritable, angry, or aggressive. If you or your family members notice these symptoms coming back, let your health care provider know right away. Follow these instructions at home: Activity Try to  get some form of exercise each day, such as walking. Try yoga, mindfulness, or other stress reduction techniques. Participate in group activities if you are able. Lifestyle Get enough sleep. Cut down on or stop using caffeine, tobacco,  alcohol, and any other harmful substances. Eat a healthy diet that includes plenty of vegetables, fruits, whole grains, low-fat dairy products, and lean protein. Limit foods that are high in solid fats, added sugar, or salt (sodium). General instructions Take over-the-counter and prescription medicines only as told by your health care provider. Keep all follow-up visits. It is important for your health care provider to check on your mood, behavior, and medicines. Your health care provider may need to make changes to your treatment. Where to find support Talking to others  Friends and family members can be sources of support and guidance. Talk to trusted friends or family members about your condition. Explain your symptoms and let them know that you are working with a health care provider to treat your depression. Tell friends and family how they can help. Finances Find mental health providers that fit with your financial situation. Talk with your health care provider if you are worried about access to food, housing, or medicine. Call your insurance company to learn about your co-pays and prescription plan. Where to find more information You can find support in your area from: Anxiety and Depression Association of America (ADAA): adaa.org Mental Health America: mentalhealthamerica.net The [employer] American on Mental Illness: nami.org Contact a health care provider if: You stop taking your antidepressant medicines, and you have any of these symptoms: Nausea. Headache. Light-headedness. Chills and body aches. Not being able to sleep (insomnia). You or your friends and family think your depression is getting worse. Get help right away if: You have thoughts of hurting yourself or others. Get help right away if you feel like you may hurt yourself or others, or have thoughts about taking your own life. Go to your nearest emergency room or: Call 911. Call the National Suicide Prevention Lifeline at  360-042-0264 or 988. This is open 24 hours a day. Text the Crisis Text Line at 952-302-5572. This information is not intended to replace advice given to you by your health care provider. Make sure you discuss any questions you have with your health care provider. Document Revised: 06/20/2021 Document Reviewed: 06/20/2021 Elsevier Patient Education  2024 [employer].

## 2023-08-21 ENCOUNTER — Ambulatory Visit: Payer: Self-pay | Admitting: Nurse Practitioner

## 2023-08-23 ENCOUNTER — Telehealth: Payer: Self-pay

## 2023-08-23 ENCOUNTER — Other Ambulatory Visit: Payer: Self-pay | Admitting: Nurse Practitioner

## 2023-08-23 DIAGNOSIS — E039 Hypothyroidism, unspecified: Secondary | ICD-10-CM

## 2023-08-23 NOTE — Telephone Encounter (Signed)
 Copied from CRM [id number]. Topic: Clinical - Medication Question >> Aug 23, 2023  8:43 AM Charlet HERO wrote: Reason for CRM: Patient is wanting to know if a script can be called in for this discontinued med naltrexone  (DEPADE) 50 MG tablet

## 2023-08-23 NOTE — Telephone Encounter (Signed)
 Called patient to get her scheduled for and appointment, no answer. Left vm

## 2023-08-24 NOTE — Patient Instructions (Signed)
 Be Involved in Caring For Your Health:  Taking Medications When medications are taken as directed, they can greatly improve your health. But if they are not taken as prescribed, they may not work. In some cases, not taking them correctly can be harmful. To help ensure your treatment remains effective and safe, understand your medications and how to take them. Bring your medications to each visit for review by your provider.  Your lab results, notes, and after visit summary will be available on My Chart. We strongly encourage you to use this feature. If lab results are abnormal the clinic will contact you with the appropriate steps. If the clinic does not contact you assume the results are satisfactory. You can always view your results on My Chart. If you have questions regarding your health or results, please contact the clinic during office hours. You can also ask questions on My Chart.  We at [facility] are grateful that you chose [place] to provide your care. We strive to provide evidence-based and compassionate care and are always looking for feedback. If you get a survey from the clinic please complete this so we can hear your opinions.  Managing Depression, Adult Depression is a mental health condition that affects your thoughts, feelings, and actions. Being diagnosed with depression can bring you relief if you did not know why you have felt or behaved a certain way. It could also leave you feeling overwhelmed. Finding ways to manage your symptoms can help you feel more positive about your future. How to manage lifestyle changes Being depressed is difficult. Depression can increase the level of everyday stress. Stress can make depression symptoms worse. You may believe your symptoms cannot be managed or will never improve. However, there are many things you can try to help manage your symptoms. There is hope. Managing stress  Stress is your body's reaction to life changes and events,  both good and bad. Stress can add to your feelings of depression. Learning to manage your stress can help lessen your feelings of depression. Try some of the following approaches to reducing your stress (stress reduction techniques): Listen to music that you enjoy and that inspires you. Try using a meditation app or take a meditation class. Develop a practice that helps you connect with your spiritual self. Walk in nature, pray, or go to a place of worship. Practice deep breathing. To do this, inhale slowly through your nose. Pause at the top of your inhale for a few seconds and then exhale slowly, letting yourself relax. Repeat this three or four times. Practice yoga to help relax and work your muscles. Choose a stress reduction technique that works for you. These techniques take time and practice to develop. Set aside 5-15 minutes a day to do them. Therapists can offer training in these techniques. Do these things to help manage stress: Keep a journal. Know your limits. Set healthy boundaries for yourself and others, such as saying "no" when you think something is too much. Pay attention to how you react to certain situations. You may not be able to control everything, but you can change your reaction. Add humor to your life by watching funny movies or shows. Make time for activities that you enjoy and that relax you. Spend less time using electronics, especially at night before bed. The light from screens can make your brain think it is time to get up rather than go to bed.  Medicines Medicines, such as antidepressants, are often a part of  treatment for depression. Talk with your pharmacist or health care provider about all the medicines, supplements, and herbal products that you take, their possible side effects, and what medicines and other products are safe to take together. Make sure to report any side effects you may have to your health care provider. Relationships Your health care  provider may suggest family therapy, couples therapy, or individual therapy as part of your treatment. How to recognize changes Everyone responds differently to treatment for depression. As you recover from depression, you may start to: Have more interest in doing activities. Feel more hopeful. Have more energy. Eat a more regular amount of food. Have better mental focus. It is important to recognize if your depression is not getting better or is getting worse. The symptoms you had in the beginning may return, such as: Feeling tired. Eating too much or too little. Sleeping too much or too little. Feeling restless, agitated, or hopeless. Trouble focusing or making decisions. Having unexplained aches and pains. Feeling irritable, angry, or aggressive. If you or your family members notice these symptoms coming back, let your health care provider know right away. Follow these instructions at home: Activity Try to get some form of exercise each day, such as walking. Try yoga, mindfulness, or other stress reduction techniques. Participate in group activities if you are able. Lifestyle Get enough sleep. Cut down on or stop using caffeine, tobacco, alcohol, and any other harmful substances. Eat a healthy diet that includes plenty of vegetables, fruits, whole grains, low-fat dairy products, and lean protein. Limit foods that are high in solid fats, added sugar, or salt (sodium). General instructions Take over-the-counter and prescription medicines only as told by your health care provider. Keep all follow-up visits. It is important for your health care provider to check on your mood, behavior, and medicines. Your health care provider may need to make changes to your treatment. Where to find support Talking to others  Friends and family members can be sources of support and guidance. Talk to trusted friends or family members about your condition. Explain your symptoms and let them know that you  are working with a health care provider to treat your depression. Tell friends and family how they can help. Finances Find mental health providers that fit with your financial situation. Talk with your health care provider if you are worried about access to food, housing, or medicine. Call your insurance company to learn about your co-pays and prescription plan. Where to find more information You can find support in your area from: Anxiety and Depression Association of America (ADAA): adaa.org Mental Health America: mentalhealthamerica.net The First American on Mental Illness: nami.org Contact a health care provider if: You stop taking your antidepressant medicines, and you have any of these symptoms: Nausea. Headache. Light-headedness. Chills and body aches. Not being able to sleep (insomnia). You or your friends and family think your depression is getting worse. Get help right away if: You have thoughts of hurting yourself or others. Get help right away if you feel like you may hurt yourself or others, or have thoughts about taking your own life. Go to your nearest emergency room or: Call 911. Call the National Suicide Prevention Lifeline at 315-804-2514 or 988. This is open 24 hours a day. Text the Crisis Text Line at 902-341-0291. This information is not intended to replace advice given to you by your health care provider. Make sure you discuss any questions you have with your health care provider. Document Revised: 06/20/2021 Document Reviewed:  06/20/2021 Elsevier Patient Education  2024 [employer].

## 2023-08-26 ENCOUNTER — Encounter: Payer: Self-pay | Admitting: Nurse Practitioner

## 2023-08-26 ENCOUNTER — Ambulatory Visit: Admitting: Nurse Practitioner

## 2023-08-26 VITALS — BP 129/74 | HR 88 | Temp 98.4°F | Ht 62.1 in | Wt 259.6 lb

## 2023-08-26 DIAGNOSIS — G4733 Obstructive sleep apnea (adult) (pediatric): Secondary | ICD-10-CM

## 2023-08-26 DIAGNOSIS — F322 Major depressive disorder, single episode, severe without psychotic features: Secondary | ICD-10-CM | POA: Diagnosis not present

## 2023-08-26 DIAGNOSIS — E782 Mixed hyperlipidemia: Secondary | ICD-10-CM

## 2023-08-26 DIAGNOSIS — E039 Hypothyroidism, unspecified: Secondary | ICD-10-CM

## 2023-08-26 DIAGNOSIS — Z1231 Encounter for screening mammogram for malignant neoplasm of breast: Secondary | ICD-10-CM

## 2023-08-26 DIAGNOSIS — E559 Vitamin D deficiency, unspecified: Secondary | ICD-10-CM

## 2023-08-26 DIAGNOSIS — I10 Essential (primary) hypertension: Secondary | ICD-10-CM

## 2023-08-26 DIAGNOSIS — J4531 Mild persistent asthma with (acute) exacerbation: Secondary | ICD-10-CM

## 2023-08-26 DIAGNOSIS — R7309 Other abnormal glucose: Secondary | ICD-10-CM

## 2023-08-26 DIAGNOSIS — J4530 Mild persistent asthma, uncomplicated: Secondary | ICD-10-CM

## 2023-08-26 DIAGNOSIS — Z1211 Encounter for screening for malignant neoplasm of colon: Secondary | ICD-10-CM

## 2023-08-26 DIAGNOSIS — F1020 Alcohol dependence, uncomplicated: Secondary | ICD-10-CM | POA: Diagnosis not present

## 2023-08-26 DIAGNOSIS — H6123 Impacted cerumen, bilateral: Secondary | ICD-10-CM

## 2023-08-26 LAB — BAYER DCA HB A1C WAIVED: HB A1C (BAYER DCA - WAIVED): 5.6 % (ref 4.8–5.6)

## 2023-08-26 MED ORDER — LEVOTHYROXINE SODIUM 75 MCG PO TABS: 75.0000 ug | ORAL_TABLET | Freq: Every day | ORAL | 3 refills | Status: DC

## 2023-08-26 MED ORDER — VALSARTAN 40 MG PO TABS: 40.0000 mg | ORAL_TABLET | Freq: Every day | ORAL | 3 refills | Status: AC

## 2023-08-26 MED ORDER — ATORVASTATIN CALCIUM 20 MG PO TABS: 20.0000 mg | ORAL_TABLET | Freq: Every day | ORAL | 3 refills | Status: AC

## 2023-08-26 MED ORDER — NALTREXONE HCL 50 MG PO TABS
50.0000 mg | ORAL_TABLET | Freq: Every day | ORAL | 0 refills | Status: DC
Start: 2023-08-26 — End: 2023-11-25

## 2023-08-26 NOTE — Assessment & Plan Note (Signed)
 Chronic with recent exacerbation.  Is scheduling with pulmonary upcoming, will review notes once present.  Continue current regimen at this time.

## 2023-08-26 NOTE — Telephone Encounter (Signed)
 Duplicate request, refilled 08/26/23.  Requested Prescriptions  Pending Prescriptions Disp Refills   levothyroxine  (SYNTHROID ) 75 MCG tablet [Pharmacy Med Name: LEVOTHYROXINE  75 MCG TABLET] 90 tablet 0    Sig: TAKE 1 TABLET BY MOUTH EVERY DAY BEFORE BREAKFAST     Endocrinology:  Hypothyroid Agents Failed - 08/26/2023  2:31 PM      Failed - TSH in normal range and within 360 days    TSH  Date Value Ref Range Status  06/05/2021 3.530 0.450 - 4.500 uIU/mL Final         Failed - Valid encounter within last 12 months    Recent Outpatient Visits           Today Current severe episode of major depressive disorder without psychotic features without prior episode [facility])   [facility] [place], Melanie DASEN, NP

## 2023-08-26 NOTE — Assessment & Plan Note (Signed)
 BMI 47.33 with HTN.  Recommended eating smaller high protein, low fat meals more frequently and exercising 30 mins a day 5 times a week with a goal of 10-15lb weight loss in the next 3 months. Patient voiced their understanding and motivation to adhere to these recommendations.

## 2023-08-26 NOTE — Assessment & Plan Note (Signed)
Chronic, ongoing.  Continue daily supplement and recheck level, adjust as needed. Family history of osteoporosis.

## 2023-08-26 NOTE — Assessment & Plan Note (Signed)
 Chronic, 8 beers a day at present.  Would like to restart Naltrexone , this worked well in past.  Recommend she attend AA and work towards complete cessation.

## 2023-08-26 NOTE — Assessment & Plan Note (Signed)
 Chronic, stable.  BP at goal in office today.  Recommend she monitor BP at least a few mornings a week at home and document.  DASH diet at home. Refills on Valsartan  sent, recommend she take daily.  Educated her at length on this change and medication.  Labs today: TSH, urine ALB, CMP and CBC.  Amlodipine  caused worsening edema in past.

## 2023-08-26 NOTE — Assessment & Plan Note (Signed)
 Ongoing, with last check 5.8%.  Recheck today.  Recommend cutting back on alcohol use.

## 2023-08-26 NOTE — Progress Notes (Signed)
 BP 129/74   Pulse 88   Temp 98.4 F (36.9 C) (Oral)   Ht 5' 2.1 (1.577 m)   Wt 259 lb 9.6 oz (117.8 kg)   LMP 12/24/2012 (Exact Date)   SpO2 97%   BMI 47.33 kg/m    Subjective:    Patient ID: Jessica Brennan, female    DOB: [birth year], 58 y.o.   MRN: [id number]  HPI: Jessica Brennan is a 58 y.o. female  Chief Complaint  Patient presents with   Depression   Hyperlipidemia   Hypertension   Hypothyroidism   Lost to follow-up since 01/07/23, has not seen PCP 12/19/21.  Would like ears checked as having issues hearing.  HYPERTENSION / HYPERLIPIDEMIA Is taking Atorvastatin  daily, not sure about Valsartan . Is going to see pulmonary after she moves, for her asthma.  Recent exacerbation.   Satisfied with current treatment? yes Duration of hypertension: chronic BP monitoring frequency: not checking BP range:  BP medication side effects: no Duration of hyperlipidemia: chronic Cholesterol medication side effects: no Cholesterol supplements: none Medication compliance: good compliance Aspirin: no Recent stressors: no Recurrent headaches: no Visual changes: no Palpitations: no Dyspnea: no Chest pain: no Lower extremity edema: no Dizzy/lightheaded: no   Impaired Fasting Glucose HbA1C:  Lab Results  Component Value Date   HGBA1C 5.8 (H) 12/19/2021  Duration of elevated blood sugar: years Polydipsia: no Polyuria: no Weight change: no Visual disturbance: no Glucose Monitoring: no    Accucheck frequency: Not Checking    Fasting glucose:     Post prandial:  Diabetic Education: Not Completed Family history of diabetes: yes    HYPOTHYROIDISM Currently on Levothyroxine  75 MCG daily.  Thyroid  control status:stable Satisfied with current treatment? yes Medication side effects: no Medication compliance: good compliance Etiology of hypothyroidism: unknown Recent dose adjustment:no Fatigue: yes, with the heat Cold intolerance: no Heat intolerance: no Weight  gain: no Weight loss: no Constipation: no Diarrhea/loose stools: no Palpitations: no Lower extremity edema: no Anxiety/depressed mood: yes, at baseline  DEPRESSION Goes to psychiatry, Dr. Chipper, last seen 06/10/23.  Reports long standing struggle with depression/anxiety.  Currently taking Zoloft  150 MG daily.  Continues to struggle with alcohol use, would like to go back on Naltrexone  which helped in past.  Currently 8 beers a day. Mood status: exacerbated Satisfied with current treatment?: no Symptom severity: moderate  Duration of current treatment : chronic Side effects: no Medication compliance: good compliance Psychotherapy/counseling: yes, current Previous psychiatric medications: effexor  Depressed mood: yes -- is moving Anxious mood: yes -- is moving Anhedonia: no Significant weight loss or gain: no Insomnia: none Fatigue: yes, with the heat Feelings of worthlessness or guilt: no Impaired concentration/indecisiveness: no Suicidal ideations: none Hopelessness: no Crying spells: no    08/26/2023    1:08 PM 01/07/2023   10:23 AM 12/24/2022   10:52 AM 12/19/2021   10:19 AM 06/05/2021    2:38 PM  Depression screen PHQ 2/9  Decreased Interest 1 2 0 1 2  Down, Depressed, Hopeless 1 2 0 1 2  PHQ - 2 Score 2 4 0 2 4  Altered sleeping 0 0 0 0 1  Tired, decreased energy 2 3 0 1 2  Change in appetite 2 1 0 0 1  Feeling bad or failure about yourself  0 2 0 1 1  Trouble concentrating 0 0 0 0 0  Moving slowly or fidgety/restless 0 0 0 0 0  Suicidal thoughts 0 0 0 0 0  PHQ-9 Score 6 10 0  4 9  Difficult doing work/chores Somewhat difficult Not difficult at all  Not difficult at all        08/26/2023    1:08 PM 01/07/2023   10:23 AM 12/19/2021   10:20 AM 06/05/2021    2:38 PM  GAD 7 : Generalized Anxiety Score  Nervous, Anxious, on Edge 1 1 2 2   Control/stop worrying 2 1 2 2   Worry too much - different things 2 1 1 1   Trouble relaxing 0 0 0 0  Restless 0 0 0 0  Easily  annoyed or irritable 0 0 0 0  Afraid - awful might happen 0 0 0 1  Total GAD 7 Score 5 3 5 6   Anxiety Difficulty Not difficult at all Not difficult at all Not difficult at all Somewhat difficult   Relevant past medical, surgical, family and social history reviewed and updated as indicated. Interim medical history since our last visit reviewed. Allergies and medications reviewed and updated.  Review of Systems  Constitutional:  Negative for activity change, appetite change, diaphoresis, fatigue and fever.  Respiratory:  Negative for cough, chest tightness and shortness of breath.   Cardiovascular:  Negative for chest pain, palpitations and leg swelling.  Gastrointestinal: Negative.   Endocrine: Negative for cold intolerance, heat intolerance, polydipsia, polyphagia and polyuria.  Neurological: Negative.   Psychiatric/Behavioral: Negative.     Per HPI unless specifically indicated above     Objective:    BP 129/74   Pulse 88   Temp 98.4 F (36.9 C) (Oral)   Ht 5' 2.1 (1.577 m)   Wt 259 lb 9.6 oz (117.8 kg)   LMP 12/24/2012 (Exact Date)   SpO2 97%   BMI 47.33 kg/m   Wt Readings from Last 3 Encounters:  08/26/23 259 lb 9.6 oz (117.8 kg)  01/07/23 258 lb 6.4 oz (117.2 kg)  12/27/22 253 lb 8.5 oz (115 kg)    Physical Exam Vitals and nursing note reviewed.  Constitutional:      General: She is awake. She is not in acute distress.    Appearance: Normal appearance. She is well-developed and well-groomed. She is obese. She is not ill-appearing or toxic-appearing.  HENT:     Head: Normocephalic.     Right Ear: Hearing, ear canal and external ear normal. There is impacted cerumen.     Left Ear: Hearing, ear canal and external ear normal. There is impacted cerumen.     Ears:     Comments: Ears irrigated by nurse with moderate removal, but some still present.  Discussed need for ear drops.    Nose: Nose normal.     Mouth/Throat:     Mouth: Mucous membranes are moist.      Pharynx: No pharyngeal swelling, oropharyngeal exudate or posterior oropharyngeal erythema.   Eyes:     General: Lids are normal.        Right eye: No discharge.        Left eye: No discharge.     Conjunctiva/sclera: Conjunctivae normal.     Pupils: Pupils are equal, round, and reactive to light.   Neck:     Thyroid : No thyromegaly.     Vascular: No carotid bruit.   Cardiovascular:     Rate and Rhythm: Normal rate and regular rhythm.     Heart sounds: Normal heart sounds. No murmur heard.    No gallop.  Pulmonary:     Effort: Pulmonary effort is normal. No accessory muscle usage or respiratory distress.  Breath sounds: Normal breath sounds. No decreased breath sounds, wheezing or rales.  Abdominal:     General: Bowel sounds are normal. There is no distension.     Palpations: Abdomen is soft.     Tenderness: There is no abdominal tenderness.   Musculoskeletal:     Cervical back: Normal range of motion and neck supple.     Right lower leg: Edema (trace) present.     Left lower leg: Edema (trace) present.  Lymphadenopathy:     Cervical: No cervical adenopathy.   Skin:    General: Skin is warm and dry.   Neurological:     Mental Status: She is alert and oriented to person, place, and time.     Cranial Nerves: Cranial nerves 2-12 are intact.     Gait: Gait is intact.     Deep Tendon Reflexes: Reflexes are normal and symmetric.     Reflex Scores:      Brachioradialis reflexes are 2+ on the right side and 2+ on the left side.      Patellar reflexes are 2+ on the right side and 2+ on the left side.  Psychiatric:        Attention and Perception: Attention normal.        Mood and Affect: Mood normal.        Speech: Speech normal.        Behavior: Behavior normal. Behavior is cooperative.        Thought Content: Thought content normal.    Results for orders placed or performed during the hospital encounter of 12/27/22  Resp panel by RT-PCR (RSV, Flu A&B, Covid) Anterior  Nasal Swab   Collection Time: 12/27/22 10:56 AM   Specimen: Anterior Nasal Swab  Result Value Ref Range   SARS Coronavirus 2 by RT PCR NEGATIVE NEGATIVE   Influenza A by PCR NEGATIVE NEGATIVE   Influenza B by PCR NEGATIVE NEGATIVE   Resp Syncytial Virus by PCR NEGATIVE NEGATIVE      Assessment & Plan:   Problem List Items Addressed This Visit       Cardiovascular and Mediastinum   Hypertension   Chronic, stable.  BP at goal in office today.  Recommend she monitor BP at least a few mornings a week at home and document.  DASH diet at home. Refills on Valsartan  sent, recommend she take daily.  Educated her at length on this change and medication.  Labs today: TSH, urine ALB, CMP and CBC.  Amlodipine  caused worsening edema in past.       Relevant Medications   valsartan  (DIOVAN ) 40 MG tablet   atorvastatin  (LIPITOR) 20 MG tablet   Other Relevant Orders   CBC with Differential/Platelet   Comprehensive metabolic panel with GFR   Microalbumin, Urine Waived     Respiratory   Mild persistent asthma   Chronic with recent exacerbation.  Is scheduling with pulmonary upcoming, will review notes once present.  Continue current regimen at this time.        Endocrine   Hypothyroidism   Chronic, ongoing.  Continue current medication regimen, adjust as needed.  Labs today.      Relevant Medications   levothyroxine  (SYNTHROID ) 75 MCG tablet   Other Relevant Orders   T4, free   TSH     Other   Vitamin D  deficiency   Chronic, ongoing.  Continue daily supplement and recheck level, adjust as needed. Family history of osteoporosis.      Relevant Orders  VITAMIN D  25 Hydroxy (Vit-D Deficiency, Fractures)   Morbid obesity (HCC)   BMI 47.33 with HTN.  Recommended eating smaller high protein, low fat meals more frequently and exercising 30 mins a day 5 times a week with a goal of 10-15lb weight loss in the next 3 months. Patient voiced their understanding and motivation to adhere to these  recommendations.       Hyperlipidemia   Chronic, ongoing.  Continue current medication regimen and adjust as needed.  Lipid panel today.      Relevant Medications   valsartan  (DIOVAN ) 40 MG tablet   atorvastatin  (LIPITOR) 20 MG tablet   Other Relevant Orders   Comprehensive metabolic panel with GFR   Lipid Panel w/o Chol/HDL Ratio   Elevated hemoglobin A1c measurement   Ongoing, with last check 5.8%.  Recheck today.  Recommend cutting back on alcohol use.      Relevant Orders   Bayer DCA Hb A1c Waived   Microalbumin, Urine Waived   Depression - Primary   Chronic, stable.  Follows with psychiatrist, Dr. Chipper, and currently taking Sertraline  150 mg daily.  Continue this collaboration and current regimen.  Recommend continued alcohol cessation.  Recommend AA meetings, which offered her benefit in past.      Alcohol use disorder, moderate, dependence (HCC)   Chronic, 8 beers a day at present.  Would like to restart Naltrexone , this worked well in past.  Recommend she attend AA and work towards complete cessation.      Relevant Orders   Comprehensive metabolic panel with GFR   Magnesium    Vitamin B12   Other Visit Diagnoses       Bilateral impacted cerumen       Ears irrigated by nurse.     Colon cancer screening       Cologuard ordered.   Relevant Orders   Cologuard     Encounter for screening mammogram for malignant neoplasm of breast       Mammogram ordered and instructed how to schedule.   Relevant Orders   MM 3D SCREENING MAMMOGRAM BILATERAL BREAST        Follow up plan: Return in about 8 weeks (around 10/21/2023) for Alcohol Abuse.

## 2023-08-26 NOTE — Assessment & Plan Note (Signed)
 Chronic, ongoing.  Continue current medication regimen, adjust as needed.  Labs today.

## 2023-08-26 NOTE — Assessment & Plan Note (Addendum)
 Chronic, stable.  Follows with psychiatrist, Dr. Chipper, and currently taking Sertraline  150 mg daily.  Continue this collaboration and current regimen.  Recommend continued alcohol cessation.  Recommend AA meetings, which offered her benefit in past.

## 2023-08-26 NOTE — Assessment & Plan Note (Signed)
 Chronic, ongoing.  Continue current medication regimen and adjust as needed. Lipid panel today.

## 2023-08-27 ENCOUNTER — Ambulatory Visit: Payer: Self-pay | Admitting: Nurse Practitioner

## 2023-08-27 DIAGNOSIS — D72828 Other elevated white blood cell count: Secondary | ICD-10-CM

## 2023-08-27 DIAGNOSIS — F1020 Alcohol dependence, uncomplicated: Secondary | ICD-10-CM

## 2023-08-27 LAB — COMPREHENSIVE METABOLIC PANEL WITH GFR
ALT: 41 IU/L — ABNORMAL HIGH (ref 0–32)
AST: 46 IU/L — ABNORMAL HIGH (ref 0–40)
Albumin: 4.9 g/dL (ref 3.8–4.9)
Alkaline Phosphatase: 81 IU/L (ref 44–121)
BUN/Creatinine Ratio: 14 (ref 12–28)
BUN: 11 mg/dL (ref 8–27)
Bilirubin Total: 0.5 mg/dL (ref 0.0–1.2)
CO2: 19 mmol/L — ABNORMAL LOW (ref 20–29)
Calcium: 9.2 mg/dL (ref 8.7–10.3)
Chloride: 97 mmol/L (ref 96–106)
Creatinine, Ser: 0.81 mg/dL (ref 0.57–1.00)
Globulin, Total: 2.9 g/dL (ref 1.5–4.5)
Glucose: 105 mg/dL — ABNORMAL HIGH (ref 70–99)
Potassium: 4.6 mmol/L (ref 3.5–5.2)
Sodium: 136 mmol/L (ref 134–144)
Total Protein: 7.8 g/dL (ref 6.0–8.5)
eGFR: 83 mL/min/{1.73_m2} (ref >59)

## 2023-08-27 LAB — CBC WITH DIFFERENTIAL/PLATELET
Basophils Absolute: 0 10*3/uL (ref 0.0–0.2)
Basos: 0 %
EOS (ABSOLUTE): 0.2 10*3/uL (ref 0.0–0.4)
Eos: 1 %
Hematocrit: 46.2 % (ref 34.0–46.6)
Hemoglobin: 15.2 g/dL (ref 11.1–15.9)
Immature Grans (Abs): 0 10*3/uL (ref 0.0–0.1)
Immature Granulocytes: 0 %
Lymphocytes Absolute: 1.9 10*3/uL (ref 0.7–3.1)
Lymphs: 13 %
MCH: 30.2 pg (ref 26.6–33.0)
MCHC: 32.9 g/dL (ref 31.5–35.7)
MCV: 92 fL (ref 79–97)
Monocytes Absolute: 0.9 10*3/uL (ref 0.1–0.9)
Monocytes: 7 %
Neutrophils Absolute: 11.2 10*3/uL — ABNORMAL HIGH (ref 1.4–7.0)
Neutrophils: 79 %
Platelets: 254 10*3/uL (ref 150–450)
RBC: 5.03 x10E6/uL (ref 3.77–5.28)
RDW: 13.5 % (ref 11.7–15.4)
WBC: 14.3 10*3/uL — ABNORMAL HIGH (ref 3.4–10.8)

## 2023-08-27 LAB — MAGNESIUM: Magnesium: 2 mg/dL (ref 1.6–2.3)

## 2023-08-27 LAB — VITAMIN B12: Vitamin B-12: 575 pg/mL (ref 232–1245)

## 2023-08-27 LAB — LIPID PANEL W/O CHOL/HDL RATIO
Cholesterol, Total: 243 mg/dL — ABNORMAL HIGH (ref 100–199)
HDL: 129 mg/dL (ref >39)
LDL Chol Calc (NIH): 98 mg/dL (ref 0–99)
Triglycerides: 95 mg/dL (ref 0–149)
VLDL Cholesterol Cal: 16 mg/dL (ref 5–40)

## 2023-08-27 LAB — TSH: TSH: 2.71 u[IU]/mL (ref 0.450–4.500)

## 2023-08-27 LAB — VITAMIN D 25 HYDROXY (VIT D DEFICIENCY, FRACTURES): Vit D, 25-Hydroxy: 34.7 ng/mL (ref 30.0–100.0)

## 2023-08-27 LAB — T4, FREE: Free T4: 1.21 ng/dL (ref 0.82–1.77)

## 2023-08-27 NOTE — Progress Notes (Signed)
 Contacted via MyChart, although on review does not regularly check so please call + needs lab only visit in 4 weeks (please scheduled for after August 1st): Good day Jessica Brennan, your labs have returned: - CBC is showing some ongoing elevation in white blood cell count and neutrophils (a type of white blood cell) + this has trended up some.  I would like to recheck in 4 weeks and if ongoing elevations I may send you to hematology for further assessment. - Kidney function, creatinine and eGFR, remains normal. Liver function is showing some mild elevations, please cut back on alcohol use as we discussed. - Lipid panel shows some LDL stable, but would benefit further lowering.  We may consider next visit increasing Atorvastatin . - Remainder of labs are stable.  Any questions? Keep being amazing!!  Thank you for allowing me to participate in your care.  I appreciate you. Kindest regards, Shneur [place]

## 2023-10-21 ENCOUNTER — Ambulatory Visit: Admitting: Nurse Practitioner

## 2023-10-27 NOTE — Patient Instructions (Incomplete)
 Alcohol Misuse and Nutrition  Alcohol misuse is any pattern of alcohol consumption that harms your health, relationships, or work. Alcohol misuse can cause poor nutrition (malnutrition or malnourishment) and a lack of nutrients (nutrient deficiencies), which can lead to more health problems.  If you think that you have an alcohol dependency problem, or if it is hard to stop drinking because you feel sick or different when you do not use alcohol, talk with your health care provider or another health professional about where to get help.  Nutrition is an essential factor in recovering from alcohol misuse. Your health care provider or diet and nutrition specialist (dietitian) will work with you to design a plan that can help to restore nutrients to your body and prevent the risk of complications.  How does alcohol misuse affect my nutrition?  Alcohol misuse brings malnutrition and nutrient deficiencies in two ways:  It causes your liver to work abnormally. This affects how your body divides (breaks down) and absorbs nutrients from food.  It causes you to eat poorly. Many people who drink alcohol do not eat enough carbohydrates, protein, fat, vitamins, and minerals.  Nutrients that are commonly lacking in people who drink alcohol include:  Vitamins.  Vitamin A. This is needed for your vision, metabolism, and ability to fight off infections (immunity).  B vitamins. These include folate, thiamine, and niacin. These are needed for new cell growth.  Vitamin C. This plays an important role in wound healing, immunity, and helping your body to absorb iron.  Vitamin D. This is necessary for your body to absorb and use calcium. It is produced by your liver, but you can also get it from food and from sun exposure.  Minerals.  Calcium. This is needed for healthy bones as well as heart and blood vessel (cardiovascular) function.  Iron. This is important for blood, muscle, and nervous system functioning.  Magnesium. This plays an  important role in muscle and nerve function, and it helps to control blood sugar and blood pressure.  Zinc. This is important for the normal functioning of your nervous system and digestive system (gastrointestinal tract).  What is my nutrition plan?    Your dietitian may develop a specific eating plan that is based on your condition and any other problems that you have. An eating plan will commonly include:  A balanced diet.  Grains: 6-8 ounce-equivalents a day. Examples of an ounce-equivalent of whole grains include 1 cup (60 g) of whole-wheat cereal,  cup (79 g) of brown rice, or 1 slice of whole-wheat bread.  Vegetables: 2-3 cups a day. Examples of 1 cup of vegetables include 2 medium carrots, 1 large tomato, or 2 stalks of celery.  Fruits: 1-2 cups a day. Examples of 1 cup of fruit include 1 large banana, 1 small apple, 8 large strawberries, or 1 large orange.  Meat and other protein: 5-6 oz (142-170 g) a day.  Foods that provide 1 oz of protein include 1 egg,  cup (28 g) of nuts or seeds, or 1 tablespoon (16 g) of peanut butter.  Dairy: 2-3 cups a day. Examples of 1 cup of dairy include 8 oz (230 mL) of milk, 8 oz (230 g) of yogurt, or 1 oz (44 g) of natural cheese.  Vitamin and mineral supplements.  What are tips for following this plan?  Eat frequent meals and snacks. Try to eat 5-6 small meals each day.  Take vitamin or mineral supplements as recommended by your dietitian.  If  you are malnourished or if your dietitian recommends it:  You may follow a high-protein, high-calorie diet. This may include:  2,000-3,000 calories (kilocalories) a day.  70-100 g (grams) of protein a day.  You may be directed to follow a diet that includes a complete nutritional supplement drink. This can help to restore calories, protein, and vitamins to your body. Depending on your condition, you may be advised to consume this drink instead of your meals or in addition to your meals.  What foods should I eat?  Eat foods that  are high in molecules that prevent oxygen from reacting with your food (antioxidants). These foods include grapes, berries, nuts, green tea, and dark green or orange vegetables. Eating these can help to prevent some of the stress that is placed on your liver by consuming alcohol.  Eat a variety of fresh fruits and vegetables each day. This will help you to get fiber and vitamins in your diet.  Drink plenty of water and other clear fluids, such as apple juice and broth. Try to drink at least 48-64 oz (1.5-2 L) of water a day.  Include foods fortified with vitamins and minerals in your diet. Commonly fortified foods include milk, orange juice, cereal, and bread.  Eat a variety of foods that are high in omega-6 fatty acids. These include fish, nuts and seeds, and soybeans. These foods may help your liver to recover and may also stabilize your mood.  If you are a vegetarian:  Eat a variety of protein-rich foods.  Pair whole grains with plant-based proteins at meals and snack time. For example, eat rice with beans, put peanut butter on whole-grain toast, or eat oatmeal with sunflower seeds.  The items listed above may not be a complete list of foods and beverages you can eat. Contact a dietitian for more information.  What foods should I avoid?  Avoid foods and drinks that are high in fat and sugar. Sugary drinks, salty snacks, and candy contain empty calories. This means that they lack important nutrients such as protein, fiber, and vitamins.  Avoid alcohol. This is the best way to avoid malnutrition due to alcohol misuse.  If you must drink, drink measured amounts. Measured drinking means limiting your intake to no more than 1 drink a day for women who are not pregnant and 2 drinks a day for men.  One drink equals 12 oz (355 mL) of beer, 5 oz (148 mL) of wine, or 1 oz (44 mL) of hard liquor.  Limit your intake of caffeine. Replace drinks like coffee and black tea with decaffeinated coffee and decaffeinated herbal  tea.  The items listed above may not be a complete list of foods and beverages you should avoid. Contact a dietitian for more information.  Summary  Alcohol misuse can cause poor nutrition and a lack of nutrients, which can lead to more health problems.  Common nutrient deficiencies include vitamin deficiencies (A, B, C, and D) and mineral deficiencies (calcium, iron, magnesium, and zinc).  Nutrition is an essential factor in the therapy for recovery from alcohol misuse.  Your health care provider and dietitian can help you to develop a specific eating plan that includes a balanced diet plus vitamin and mineral supplements.  This information is not intended to replace advice given to you by your health care provider. Make sure you discuss any questions you have with your health care provider.  Document Revised: 04/19/2021 Document Reviewed: 04/19/2021  Elsevier Patient Education  2024 [employer].

## 2023-10-30 ENCOUNTER — Ambulatory Visit: Admitting: Nurse Practitioner

## 2023-11-03 NOTE — Patient Instructions (Incomplete)

## 2023-11-05 ENCOUNTER — Ambulatory Visit: Admitting: Nurse Practitioner

## 2023-11-05 DIAGNOSIS — D72828 Other elevated white blood cell count: Secondary | ICD-10-CM

## 2023-11-05 DIAGNOSIS — F1020 Alcohol dependence, uncomplicated: Secondary | ICD-10-CM

## 2023-11-05 DIAGNOSIS — Z23 Encounter for immunization: Secondary | ICD-10-CM

## 2023-11-22 ENCOUNTER — Other Ambulatory Visit: Payer: Self-pay | Admitting: Nurse Practitioner

## 2023-11-22 NOTE — Telephone Encounter (Signed)
 Requested medication (s) are due for refill today: yes  Requested medication (s) are on the active medication list: yes  Last refill:  08/26/23  Future visit scheduled: no  Notes to clinic:  Unable to refill per protocol, cannot delegate.      Requested Prescriptions  Pending Prescriptions Disp Refills   naltrexone  (DEPADE) 50 MG tablet [Pharmacy Med Name: NALTREXONE  50 MG TABLET] 90 tablet 0    Sig: TAKE 1 TABLET BY MOUTH EVERY DAY     Not Delegated - Psychiatry: Drug Dependence Therapy - naltrexone  Failed - 11/22/2023  4:03 PM      Failed - This refill cannot be delegated      Passed - Completed PHQ-2 or PHQ-9 in the last 360 days      Passed - Valid encounter within last 6 months    Recent Outpatient Visits           2 months ago Current severe episode of major depressive disorder without psychotic features without prior episode [facility])   [place] [facility] [place], Melanie DASEN, NP

## 2023-12-02 ENCOUNTER — Encounter: Payer: Self-pay | Admitting: Pulmonary Disease

## 2023-12-02 ENCOUNTER — Ambulatory Visit: Admitting: Pulmonary Disease

## 2024-01-03 ENCOUNTER — Telehealth: Payer: Self-pay | Admitting: Nurse Practitioner

## 2024-01-03 NOTE — Telephone Encounter (Signed)
 Copied from CRM [id number]. Topic: Referral - Request for Referral >> Jan 03, 2024  1:22 PM Teressa P wrote: Did the patient discuss referral with their provider in the last year? Yes (If No - schedule appointment) (If Yes - send message)  Appointment offered? No  Type of order/referral and detailed reason for visit: swollen legs  Preference of office, provider, location: no preference  If referral order, have you been seen by this specialty before? No (If Yes, this issue or another issue? When? Where?  Can we respond through MyChart? No

## 2024-01-06 NOTE — Telephone Encounter (Signed)
 Scheduled

## 2024-01-06 NOTE — Patient Instructions (Incomplete)
 Edema  Edema is when you have too much fluid in your body or under your skin. Edema may make your legs, feet, and ankles swell. Swelling often happens in looser tissues, such as around your eyes. This is a common condition. It gets more common as you get older. There are many possible causes of edema. These include: Eating too much salt (sodium). Being on your feet or sitting for a long time. Certain medical conditions, such as: Pregnancy. Heart failure. Liver disease. Kidney disease. Cancer. Hot weather may make edema worse. Edema is usually painless. Your skin may look swollen or shiny. Follow these instructions at home: Medicines Take over-the-counter and prescription medicines only as told by your doctor. Your doctor may prescribe a medicine to help your body get rid of extra water (diuretic). Take this medicine if you are told to take it. Eating and drinking Eat a low-salt (low-sodium) diet as told by your doctor. Sometimes, eating less salt may reduce swelling. Depending on the cause of your swelling, you may need to limit how much fluid you drink (fluid restriction). General instructions Raise the injured area above the level of your heart while you are sitting or lying down. Do not sit still or stand for a long time. Do not wear tight clothes. Do not wear garters on your upper legs. Exercise your legs. This can help the swelling go down. Wear compression stockings as told by your doctor. It is important that these are the right size. These should be prescribed by your doctor to prevent possible injuries. If elastic bandages or wraps are recommended, use them as told by your doctor. Contact a doctor if: Treatment is not working. You have heart, liver, or kidney disease and have symptoms of edema. You have sudden and unexplained weight gain. Get help right away if: You have shortness of breath or chest pain. You cannot breathe when you lie down. You have pain, redness, or  warmth in the swollen areas. You have heart, liver, or kidney disease and get edema all of a sudden. You have a fever and your symptoms get worse all of a sudden. These symptoms may be an emergency. Get help right away. Call 911. Do not wait to see if the symptoms will go away. Do not drive yourself to the hospital. Summary Edema is when you have too much fluid in your body or under your skin. Edema may make your legs, feet, and ankles swell. Swelling often happens in looser tissues, such as around your eyes. Raise the injured area above the level of your heart while you are sitting or lying down. Follow your doctor's instructions about diet and how much fluid you can drink. This information is not intended to replace advice given to you by your health care provider. Make sure you discuss any questions you have with your health care provider. Document Revised: 10/17/2020 Document Reviewed: 10/17/2020 Elsevier Patient Education  2024 [employer].

## 2024-01-06 NOTE — Telephone Encounter (Signed)
 Please schedule patient with PCP. Thank you!

## 2024-01-07 ENCOUNTER — Ambulatory Visit: Admitting: Nurse Practitioner

## 2024-01-07 DIAGNOSIS — F1020 Alcohol dependence, uncomplicated: Secondary | ICD-10-CM

## 2024-01-07 DIAGNOSIS — R7989 Other specified abnormal findings of blood chemistry: Secondary | ICD-10-CM

## 2024-01-07 DIAGNOSIS — R600 Localized edema: Secondary | ICD-10-CM

## 2024-01-21 ENCOUNTER — Ambulatory Visit: Admitting: Student in an Organized Health Care Education/Training Program

## 2024-01-21 ENCOUNTER — Encounter: Payer: Self-pay | Admitting: Student in an Organized Health Care Education/Training Program

## 2024-01-21 VITALS — BP 142/86 | HR 88 | Temp 98.5°F | Ht 62.1 in | Wt 265.0 lb

## 2024-01-21 DIAGNOSIS — G4733 Obstructive sleep apnea (adult) (pediatric): Secondary | ICD-10-CM

## 2024-01-21 DIAGNOSIS — J4520 Mild intermittent asthma, uncomplicated: Secondary | ICD-10-CM

## 2024-01-21 MED ORDER — FLUTICASONE-SALMETEROL 100-50 MCG/ACT IN AEPB
1.0000 | INHALATION_SPRAY | Freq: Two times a day (BID) | RESPIRATORY_TRACT | 11 refills | Status: AC
Start: 2024-01-21 — End: 2025-01-20

## 2024-01-21 NOTE — Progress Notes (Signed)
 Assessment & Plan:   Assessment & Plan  #Mild intermittent asthma without complication (Primary)  Reports symptoms that are consistent with asthma, especially given her history of childhood asthma as well as family history. No current symptoms reported, but patient does endorse exertional dyspnea with likely contribution from obesity. Previous exacerbations required ER visits. Will start low dose ICS/LABA empirically, and obtain PFT's to assess degree of obstruction and reversibility. Will also obtain an allergen panel and CBC w/ differential to assess t-helper cell type two response.  - Pulmonary Function Test; Future - Allergen Panel (27) + IGE - CBC with Differential/Platelet - fluticasone -salmeterol (WIXELA INHUB) 100-50 MCG/ACT AEPB; Inhale 1 puff into the lungs 2 (two) times daily.  Dispense: 60 each; Refill: 11  #Morbid obesity  Contributing to health concerns. Actively pursuing weight loss through dietary changes. Interested in weight loss medications. Patient at risk for OSA given body habitus and neck circumference. Will refer to sleep medicine for evaluation as she might benefit from tirzepatide.  - Referred to sleep medicine for sleep apnea evaluation. - Discussed potential for weight loss medication if moderate sleep apnea is diagnosed.    Return in about 3 months (around 04/22/2024).  Belva November, MD [facility]  I spent 45 minutes caring for this patient today, including preparing to see the patient, obtaining a medical history , reviewing a separately obtained history, performing a medically appropriate examination and/or evaluation, counseling and educating the patient/family/caregiver, ordering medications, tests, or procedures, documenting clinical information in the electronic health record, and independently interpreting results (not separately reported/billed) and communicating results to the patient/family/caregiver  End of visit  medications:  Meds ordered this encounter  Medications   fluticasone -salmeterol (WIXELA INHUB) 100-50 MCG/ACT AEPB    Sig: Inhale 1 puff into the lungs 2 (two) times daily.    Dispense:  60 each    Refill:  11     Current Outpatient Medications:    albuterol  (VENTOLIN  HFA) 108 (90 Base) MCG/ACT inhaler, Inhale 2 puffs into the lungs every 6 (six) hours as needed for wheezing or shortness of breath., Disp: 1 each, Rfl: 2   Albuterol -Budesonide  (AIRSUPRA ) 90-80 MCG/ACT AERO, Inhale 2 puffs into the lungs 4 (four) times daily as needed (wheeze, shortness of breath)., Disp: 10.7 g, Rfl: 2   atorvastatin  (LIPITOR) 20 MG tablet, Take 1 tablet (20 mg total) by mouth daily., Disp: 90 tablet, Rfl: 3   cholecalciferol  (VITAMIN D3) 25 MCG (1000 UNIT) tablet, Take 2,000 Units by mouth daily., Disp: , Rfl:    fluticasone -salmeterol (WIXELA INHUB) 100-50 MCG/ACT AEPB, Inhale 1 puff into the lungs 2 (two) times daily., Disp: 60 each, Rfl: 11   levothyroxine  (SYNTHROID ) 75 MCG tablet, Take 1 tablet (75 mcg total) by mouth daily before breakfast., Disp: 90 tablet, Rfl: 3   naltrexone  (DEPADE) 50 MG tablet, TAKE 1 TABLET BY MOUTH EVERY DAY, Disp: 90 tablet, Rfl: 0   sertraline  (ZOLOFT ) 100 MG tablet, Take 200 mg by mouth daily. (Patient taking differently: Take 150 mg by mouth daily.), Disp: , Rfl:    valsartan  (DIOVAN ) 40 MG tablet, Take 1 tablet (40 mg total) by mouth daily., Disp: 90 tablet, Rfl: 3   albuterol  (PROVENTIL ) (2.5 MG/3ML) 0.083% nebulizer solution, Take 3 mLs (2.5 mg total) by nebulization every 6 (six) hours as needed for wheezing or shortness of breath. (Patient not taking: Reported on 01/21/2024), Disp: 75 mL, Rfl: 2   Subjective:   PATIENT ID: Jessica Brennan GENDER: female DOB:  January 12, 1966, MRN: [id number]  Chief Complaint  Patient presents with   Asthma    Diagnosed with Asthma since she was child and then after having pneumonia 20 years ago. DOE. Wheezing. No cough.  Albuterol -  PRN    HPI  Discussed the use of AI scribe software for clinical note transcription with the patient, who gave verbal consent to proceed.  History of Present Illness Jessica Brennan is a 58 year old female with asthma and morbid obesity who presents with shortness of breath.  She experiences shortness of breath primarily with exertion and has no current shortness of breath at rest, cough, chest pain, or chest tightness. Occasional wheezing occurs, particularly in the mornings.  She was told she had asthma approximately five years ago following three respiratory infections in one year, which she attributes to exposure from a coworker's child attending daycare. At that time, she experienced wheezing, coughing, and shortness of breath. She has had three emergency room visits for asthma exacerbations, with the most recent being in October 2024, where her symptoms improved after nebulizer treatments and steroids. She has a history of asthma in childhood, which resolved before her twenties, but recurred five years ago. Her sister also has severe asthma.  She currently uses an albuterol  inhaler as needed, which she keeps in her car. She wants to avoid future emergency room visits.  She mentions a possible allergic reaction to bacon about a month ago and is concerned about the possibility of alpha-gal syndrome, although she has not been bitten by a tick to her knowledge.  She is actively trying to lose weight by drinking water, eating salads, and avoiding carbohydrates. She is a non-smoker. She worked as a runner, broadcasting/film/video.   Ancillary information including prior medications, full medical/surgical/family/social histories, and PFTs (when available) are listed below and have been reviewed.    Review of Systems  Constitutional:  Negative for chills, fever, malaise/fatigue and weight loss.  Respiratory:  Positive for shortness of breath (with exertion). Negative for cough, hemoptysis, sputum production and  wheezing.   Cardiovascular:  Negative for chest pain.     Objective:   Vitals:   01/21/24 1301  BP: (!) 142/86  Pulse: 88  Temp: 98.5 F (36.9 C)  SpO2: 96%  Weight: 265 lb (120.2 kg)  Height: 5' 2.1 (1.577 m)   96% on RA  BMI Readings from Last 3 Encounters:  01/21/24 48.31 kg/m  08/26/23 47.33 kg/m  01/07/23 45.79 kg/m   Wt Readings from Last 3 Encounters:  01/21/24 265 lb (120.2 kg)  08/26/23 259 lb 9.6 oz (117.8 kg)  01/07/23 258 lb 6.4 oz (117.2 kg)    Physical Exam Constitutional:      Appearance: She is obese. She is not ill-appearing.  Cardiovascular:     Rate and Rhythm: Normal rate and regular rhythm.     Pulses: Normal pulses.     Heart sounds: Normal heart sounds.  Pulmonary:     Effort: Pulmonary effort is normal. No respiratory distress.     Breath sounds: Normal breath sounds. No wheezing or rales.  Neurological:     General: No focal deficit present.     Mental Status: She is alert and oriented to person, place, and time. Mental status is at baseline.       Ancillary Information    Past Medical History:  Diagnosis Date   Anxiety    Asthma    Complication of anesthesia    aspirated during septoplasty   Diffuse cystic mastopathy  Dysthymic disorder    Exposure to TB    > 25 yrs ago.  Tested (+) for exposure, Xrays showed no TB   Hypertension    Hypothyroidism    Menopausal state    Obesity    Vitamin D  deficiency      Family History  Problem Relation Age of Onset   Cancer Mother        lung   Heart disease Father    Hyperlipidemia Sister    Arthritis Sister    Hyperlipidemia Sister    Arthritis Sister    Hyperlipidemia Brother    Arthritis Brother    Arthritis Brother    Hyperlipidemia Brother    Hyperlipidemia Brother    Arthritis Brother    Cancer Paternal Grandmother        uterine   Stroke Paternal Grandfather      Past Surgical History:  Procedure Laterality Date   ANTERIOR CRUCIATE LIGAMENT REPAIR Left  2002   needs to have screw removed during tkr   BREAST BIOPSY Left    1979 negative   CHOLECYSTECTOMY     DG LEFT TIBIA AND FIBULA ([facility] HX) Left    HARDWARE REMOVAL Left 11/20/2018   Procedure: HARDWARE REMOVAL;  Surgeon: Marchia Drivers, MD;  Location: [facility] ORS;  Service: Orthopedics;  Laterality: Left;  screw removal   SEPTOPLASTY  2010   TOTAL ABDOMINAL HYSTERECTOMY     TOTAL KNEE ARTHROPLASTY Right 09/18/2018   Procedure: TOTAL KNEE ARTHROPLASTY;  Surgeon: Marchia Drivers, MD;  Location: [facility] ORS;  Service: Orthopedics;  Laterality: Right;   TOTAL KNEE ARTHROPLASTY Left 11/20/2018   Procedure: TOTAL KNEE ARTHROPLASTY;  Surgeon: Marchia Drivers, MD;  Location: [facility] ORS;  Service: Orthopedics;  Laterality: Left;    Social History   Socioeconomic History   Marital status: Divorced    Spouse name: Not on file   Number of children: Not on file   Years of education: Not on file   Highest education level: Not on file  Occupational History   Occupation: special ed runner, 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  Tobacco Use   Smoking status: Never   Smokeless tobacco: Never  Vaping Use   Vaping status: Never Used  Substance and Sexual Activity   Alcohol use: Yes    Alcohol/week: 6.0 standard drinks of alcohol    Types: 6 Cans of beer per week    Comment: on weekends   Drug use: No   Sexual activity: Never  Other Topics Concern   Not on file  Social History Narrative   Not on file   Social Drivers of Health   Financial Resource Strain: Not on file  Food Insecurity: Not on file  Transportation Needs: Not on file  Physical Activity: Not on file  Stress: Not on file  Social Connections: Not on file  Intimate Partner Violence: Not on file     Allergies  Allergen Reactions   Codeine Sulfate Hives and Other (See Comments)    Stomachache, GI upset, welts   Other Other (See Comments)    Mold- Sinus infections   Pollen Extract Other (See Comments)    sneezing     CBC    Component  Value Date/Time   WBC 14.3 (H) 08/26/2023 1345   WBC 15.4 (H) 11/22/2018 0438   RBC 5.03 08/26/2023 1345   RBC 4.40 11/22/2018 0438   HGB 15.2 08/26/2023 1345   HCT 46.2 08/26/2023 1345   PLT 254 08/26/2023 1345   MCV 92 08/26/2023 1345  MCH 30.2 08/26/2023 1345   MCH 28.4 11/22/2018 0438   MCHC 32.9 08/26/2023 1345   MCHC 32.6 11/22/2018 0438   RDW 13.5 08/26/2023 1345   LYMPHSABS 1.9 08/26/2023 1345   MONOABS 0.6 11/13/2018 1511   EOSABS 0.2 08/26/2023 1345   BASOSABS 0.0 08/26/2023 1345    Pulmonary Functions Testing Results:     No data to display          Outpatient Medications Prior to Visit  Medication Sig Dispense Refill   albuterol  (VENTOLIN  HFA) 108 (90 Base) MCG/ACT inhaler Inhale 2 puffs into the lungs every 6 (six) hours as needed for wheezing or shortness of breath. 1 each 2   Albuterol -Budesonide  (AIRSUPRA ) 90-80 MCG/ACT AERO Inhale 2 puffs into the lungs 4 (four) times daily as needed (wheeze, shortness of breath). 10.7 g 2   atorvastatin  (LIPITOR) 20 MG tablet Take 1 tablet (20 mg total) by mouth daily. 90 tablet 3   cholecalciferol  (VITAMIN D3) 25 MCG (1000 UNIT) tablet Take 2,000 Units by mouth daily.     levothyroxine  (SYNTHROID ) 75 MCG tablet Take 1 tablet (75 mcg total) by mouth daily before breakfast. 90 tablet 3   naltrexone  (DEPADE) 50 MG tablet TAKE 1 TABLET BY MOUTH EVERY DAY 90 tablet 0   sertraline  (ZOLOFT ) 100 MG tablet Take 200 mg by mouth daily. (Patient taking differently: Take 150 mg by mouth daily.)     valsartan  (DIOVAN ) 40 MG tablet Take 1 tablet (40 mg total) by mouth daily. 90 tablet 3   albuterol  (PROVENTIL ) (2.5 MG/3ML) 0.083% nebulizer solution Take 3 mLs (2.5 mg total) by nebulization every 6 (six) hours as needed for wheezing or shortness of breath. (Patient not taking: Reported on 01/21/2024) 75 mL 2   No facility-administered medications prior to visit.

## 2024-01-21 NOTE — Patient Instructions (Signed)
  VISIT SUMMARY: Today, we discussed your asthma, weight management, and potential sleep apnea. You reported shortness of breath with exertion and occasional morning wheezing. We reviewed your asthma history, including past emergency room visits. You are actively trying to lose weight and are concerned about a possible allergic reaction to bacon.  YOUR PLAN: -ASTHMA: Asthma is a condition where your airways narrow and swell, making it difficult to breathe. We have ordered a pulmonary function test to confirm your diagnosis and prescribed an inhaler to use twice daily. Please rinse your mouth after each use. We also ordered blood work to test for environmental allergies. Follow up in three months.  -MORBID OBESITY: Morbid obesity is a condition where excess body fat negatively affects your health. You are making dietary changes to lose weight. We discussed the possibility of weight loss medication, which depends on the results of a sleep apnea evaluation. You have been referred to sleep medicine for this evaluation.  -SUSPECTED SLEEP APNEA: Sleep apnea is a condition where your breathing repeatedly stops and starts during sleep. We have referred you to sleep medicine for an evaluation to determine if you have sleep apnea, which could also support the use of weight loss medication.  INSTRUCTIONS: Please follow up in three months for your asthma. Complete the pulmonary function test and blood work as ordered. Attend the sleep medicine evaluation for sleep apnea. Continue with your dietary changes and keep using your inhaler as prescribed.  Today, I ordered blood work. You can get them draw at your preferred [facility] draw station. The nearest one to [facility] is at nearby [facility] ([address] [place], [place], [place] [address]).    Contains text generated by [employer].

## 2024-02-05 ENCOUNTER — Ambulatory Visit: Admitting: Sleep Medicine

## 2024-02-09 DIAGNOSIS — J4520 Mild intermittent asthma, uncomplicated: Secondary | ICD-10-CM | POA: Insufficient documentation

## 2024-02-09 NOTE — Patient Instructions (Incomplete)
 Be Involved in Caring For Your Health:  Taking Medications When medications are taken as directed, they can greatly improve your health. But if they are not taken as prescribed, they may not work. In some cases, not taking them correctly can be harmful. To help ensure your treatment remains effective and safe, understand your medications and how to take them. Bring your medications to each visit for review by your provider.  Your lab results, notes, and after visit summary will be available on My Chart. We strongly encourage you to use this feature. If lab results are abnormal the clinic will contact you with the appropriate steps. If the clinic does not contact you assume the results are satisfactory. You can always view your results on My Chart. If you have questions regarding your health or results, please contact the clinic during office hours. You can also ask questions on My Chart.  We at [facility] are grateful that you chose us  to provide your care. We strive to provide evidence-based and compassionate care and are always looking for feedback. If you get a survey from the clinic please complete this so we can hear your opinions.  Managing Depression, Adult Depression is a mental health condition that affects your thoughts, feelings, and actions. Being diagnosed with depression can bring you relief if you did not know why you have felt or behaved a certain way. It could also leave you feeling overwhelmed. Finding ways to manage your symptoms can help you feel more positive about your future. How to manage lifestyle changes Being depressed is difficult. Depression can increase the level of everyday stress. Stress can make depression symptoms worse. You may believe your symptoms cannot be managed or will never improve. However, there are many things you can try to help manage your symptoms. There is hope. Managing stress  Stress is your body's reaction to life changes and events,  both good and bad. Stress can add to your feelings of depression. Learning to manage your stress can help lessen your feelings of depression. Try some of the following approaches to reducing your stress (stress reduction techniques): Listen to music that you enjoy and that inspires you. Try using a meditation app or take a meditation class. Develop a practice that helps you connect with your spiritual self. Walk in nature, pray, or go to a place of worship. Practice deep breathing. To do this, inhale slowly through your nose. Pause at the top of your inhale for a few seconds and then exhale slowly, letting yourself relax. Repeat this three or four times. Practice yoga to help relax and work your muscles. Choose a stress reduction technique that works for you. These techniques take time and practice to develop. Set aside 5-15 minutes a day to do them. Therapists can offer training in these techniques. Do these things to help manage stress: Keep a journal. Know your limits. Set healthy boundaries for yourself and others, such as saying no when you think something is too much. Pay attention to how you react to certain situations. You may not be able to control everything, but you can change your reaction. Add humor to your life by watching funny movies or shows. Make time for activities that you enjoy and that relax you. Spend less time using electronics, especially at night before bed. The light from screens can make your brain think it is time to get up rather than go to bed.  Medicines Medicines, such as antidepressants, are often a part of  treatment for depression. Talk with your pharmacist or health care provider about all the medicines, supplements, and herbal products that you take, their possible side effects, and what medicines and other products are safe to take together. Make sure to report any side effects you may have to your health care provider. Relationships Your health care  provider may suggest family therapy, couples therapy, or individual therapy as part of your treatment. How to recognize changes Everyone responds differently to treatment for depression. As you recover from depression, you may start to: Have more interest in doing activities. Feel more hopeful. Have more energy. Eat a more regular amount of food. Have better mental focus. It is important to recognize if your depression is not getting better or is getting worse. The symptoms you had in the beginning may return, such as: Feeling tired. Eating too much or too little. Sleeping too much or too little. Feeling restless, agitated, or hopeless. Trouble focusing or making decisions. Having unexplained aches and pains. Feeling irritable, angry, or aggressive. If you or your family members notice these symptoms coming back, let your health care provider know right away. Follow these instructions at home: Activity Try to get some form of exercise each day, such as walking. Try yoga, mindfulness, or other stress reduction techniques. Participate in group activities if you are able. Lifestyle Get enough sleep. Cut down on or stop using caffeine, tobacco, alcohol, and any other harmful substances. Eat a healthy diet that includes plenty of vegetables, fruits, whole grains, low-fat dairy products, and lean protein. Limit foods that are high in solid fats, added sugar, or salt (sodium). General instructions Take over-the-counter and prescription medicines only as told by your health care provider. Keep all follow-up visits. It is important for your health care provider to check on your mood, behavior, and medicines. Your health care provider may need to make changes to your treatment. Where to find support Talking to others  Friends and family members can be sources of support and guidance. Talk to trusted friends or family members about your condition. Explain your symptoms and let them know that you  are working with a health care provider to treat your depression. Tell friends and family how they can help. Finances Find mental health providers that fit with your financial situation. Talk with your health care provider if you are worried about access to food, housing, or medicine. Call your insurance company to learn about your co-pays and prescription plan. Where to find more information You can find support in your area from: Anxiety and Depression Association of America (ADAA): adaa.org Mental Health America: mentalhealthamerica.net [employer] on Mental Illness: nami.org Contact a health care provider if: You stop taking your antidepressant medicines, and you have any of these symptoms: Nausea. Headache. Light-headedness. Chills and body aches. Not being able to sleep (insomnia). You or your friends and family think your depression is getting worse. Get help right away if: You have thoughts of hurting yourself or others. Get help right away if you feel like you may hurt yourself or others, or have thoughts about taking your own life. Go to your nearest emergency room or: Call 911. Call the National Suicide Prevention Lifeline at (743) 630-7432 or 988. This is open 24 hours a day. Text the Crisis Text Line at 854-590-3828. This information is not intended to replace advice given to you by your health care provider. Make sure you discuss any questions you have with your health care provider. Document Revised: 06/20/2021 Document Reviewed:  06/20/2021 Elsevier Patient Education  2024 [employer].

## 2024-02-12 ENCOUNTER — Ambulatory Visit: Admitting: Sleep Medicine

## 2024-02-13 ENCOUNTER — Ambulatory Visit: Admitting: Nurse Practitioner

## 2024-02-13 DIAGNOSIS — E782 Mixed hyperlipidemia: Secondary | ICD-10-CM

## 2024-02-13 DIAGNOSIS — F1020 Alcohol dependence, uncomplicated: Secondary | ICD-10-CM

## 2024-02-13 DIAGNOSIS — I10 Essential (primary) hypertension: Secondary | ICD-10-CM

## 2024-02-13 DIAGNOSIS — F322 Major depressive disorder, single episode, severe without psychotic features: Secondary | ICD-10-CM

## 2024-02-13 DIAGNOSIS — J4520 Mild intermittent asthma, uncomplicated: Secondary | ICD-10-CM

## 2024-02-13 DIAGNOSIS — R7309 Other abnormal glucose: Secondary | ICD-10-CM

## 2024-02-18 ENCOUNTER — Ambulatory Visit: Admitting: Allergy and Immunology

## 2024-02-18 ENCOUNTER — Encounter: Payer: Self-pay | Admitting: Allergy and Immunology

## 2024-02-18 ENCOUNTER — Other Ambulatory Visit: Payer: Self-pay

## 2024-02-18 ENCOUNTER — Other Ambulatory Visit: Payer: Self-pay | Admitting: Allergy and Immunology

## 2024-02-18 VITALS — BP 132/64 | HR 93 | Temp 98.9°F | Resp 16 | Wt 272.0 lb

## 2024-02-18 DIAGNOSIS — J3089 Other allergic rhinitis: Secondary | ICD-10-CM | POA: Diagnosis not present

## 2024-02-18 DIAGNOSIS — E66813 Obesity, class 3: Secondary | ICD-10-CM | POA: Diagnosis not present

## 2024-02-18 DIAGNOSIS — I872 Venous insufficiency (chronic) (peripheral): Secondary | ICD-10-CM

## 2024-02-18 DIAGNOSIS — R7989 Other specified abnormal findings of blood chemistry: Secondary | ICD-10-CM

## 2024-02-18 DIAGNOSIS — T7819XD Other adverse food reactions, not elsewhere classified, subsequent encounter: Secondary | ICD-10-CM

## 2024-02-18 DIAGNOSIS — J4530 Mild persistent asthma, uncomplicated: Secondary | ICD-10-CM

## 2024-02-18 DIAGNOSIS — Z6841 Body Mass Index (BMI) 40.0 and over, adult: Secondary | ICD-10-CM

## 2024-02-18 MED ORDER — EPINEPHRINE 0.3 MG/0.3ML IJ SOAJ: 0.3000 mg | INTRAMUSCULAR | 1 refills | Status: AC | PRN

## 2024-02-18 MED ORDER — QVAR REDIHALER 80 MCG/ACT IN AERB: 2.0000 | INHALATION_SPRAY | Freq: Every day | RESPIRATORY_TRACT | 5 refills | Status: DC

## 2024-02-18 MED ORDER — CETIRIZINE HCL 10 MG PO TABS: 10.0000 mg | ORAL_TABLET | Freq: Every day | ORAL | 1 refills | Status: AC | PRN

## 2024-02-18 MED ORDER — AIRSUPRA 90-80 MCG/ACT IN AERO: 2.0000 | INHALATION_SPRAY | Freq: Four times a day (QID) | RESPIRATORY_TRACT | 2 refills | Status: DC | PRN

## 2024-02-18 NOTE — Patient Instructions (Addendum)
" °  1. Return to clinic for skin testing (no antihistamines)  2. Treat and prevent inflammation of airway:   A. OTC Flonase  Sensimist - 2 sprays each nostril 1 time per day  B. Qvar  80 - 2 inhalations 1 time per day (empty lungs)  3. If needed:   A. Airsupra  - 2 inhalations every 4-6 hours  B. Cetirizine  10 mg - 1 tablet 1 time per day  C. Epi-pen, benadryl , MD/ER evaluation for allergic reaction  4. Obtain a comfortable set of compression stockings  5.  Make a plan for very slow and consistent weight loss and progressive exercise program  6. Influenza = Tamiflu. Covid = Paxlovid "

## 2024-02-18 NOTE — Progress Notes (Signed)
 " [place]   Dear Dr. Isadora,  Thank you for referring Jessica Brennan to the [facility]  on 02/18/2024.   Below is a summation of this patient's evaluation and recommendations.  Thank you for your referral. I will keep you informed about this patient's response to treatment.   If you have any questions please do not hesitate to contact me.   Sincerely,  Camellia DOROTHA Denis, MD Allergy / Immunology [facility] [place]    ______________________________________________________________________    NEW PATIENT NOTE  Referring Provider: Isadora Hose, MD Primary Provider: Valerio Melanie DASEN, NP Date of office visit: 02/18/2024    Subjective:   Chief Complaint:  Jessica Brennan (DOB: [birth year]) is a 58 y.o. female who presents to the clinic on 02/18/2024 with a chief complaint of Establish Care (Pt states she's pretty sure she has allergies, got pneumonia from mold at school.. Ate bacon and got red, itchy, heart beat increased, and throat closed up. Stays away from red meat. Perfumes bother her also.) .     HPI: Jessica Brennan presents to this clinic in evaluation of 2 main issues.  First, about 2 months ago she had a bacon pizza and soon after eating this food she developed itchiness across her body and she had redness across her body and her heart rate went up and then she had trouble talking for she developed a raspy voice.  She went out of the restaurant and use an inhaler and drink some water and she was better in about 30 minutes.  She has eaten the pizza since that point in time with no problem but that pizza did not have any bacon.  She has been mammal consumption free since that event.  Second, she has lots of respiratory tract symptoms with shortness of breath and wheezing and coughing and sneezing and blowing.  She did have a history of childhood asthma  that for the most part resolved but she has had recurrent respiratory tract symptoms involving her lower airway over the course of the past 4 years.  When she exerts her self she does get short of breath.  She does not really perform any exercise.  She does not have any anosmia or ugly nasal discharge.  Provoking factors for symptoms include locating to the outdoors and strong perfumes.  She is a non-smoker and a never smoker but while she was living in her parents house she had extensive smoke exposure.  She has seen pulmonology regarding her respiratory tract issues and has been prescribed multiple medications but it sounds as though she does not use any of these medications.  Occasionally she will use a rescue inhaler.  She drinks approximately 6 cans of beer a week usually on the weekends.  Past Medical History:  Diagnosis Date   Anxiety    Asthma    Complication of anesthesia    aspirated during septoplasty   Diffuse cystic mastopathy    Dysthymic disorder    Exposure to TB    > 25 yrs ago.  Tested (+) for exposure, Xrays showed no TB   Hypertension    Hypothyroidism    Menopausal state    Obesity    Vitamin D  deficiency     Past Surgical History:  Procedure Laterality Date   ANTERIOR CRUCIATE LIGAMENT REPAIR Left 2002   needs to have screw removed during tkr   BREAST BIOPSY  Left    1979 negative   CHOLECYSTECTOMY     DG LEFT TIBIA AND FIBULA ([facility] HX) Left    HARDWARE REMOVAL Left 11/20/2018   Procedure: HARDWARE REMOVAL;  Surgeon: Marchia Drivers, MD;  Location: [facility] ORS;  Service: Orthopedics;  Laterality: Left;  screw removal   SEPTOPLASTY  2010   TOTAL ABDOMINAL HYSTERECTOMY     TOTAL KNEE ARTHROPLASTY Right 09/18/2018   Procedure: TOTAL KNEE ARTHROPLASTY;  Surgeon: Marchia Drivers, MD;  Location: [facility] ORS;  Service: Orthopedics;  Laterality: Right;   TOTAL KNEE ARTHROPLASTY Left 11/20/2018   Procedure: TOTAL KNEE ARTHROPLASTY;  Surgeon: Marchia Drivers, MD;   Location: [facility] ORS;  Service: Orthopedics;  Laterality: Left;    Allergies as of 02/18/2024       Reactions   Codeine Sulfate Hives, Other (See Comments)   Stomachache, GI upset, welts   Other Other (See Comments)   Mold- Sinus infections   Pollen Extract Other (See Comments)   sneezing        Medication List    albuterol  108 (90 Base) MCG/ACT inhaler Commonly known as: VENTOLIN  HFA Inhale 2 puffs into the lungs every 6 (six) hours as needed for wheezing or shortness of breath.   albuterol  (2.5 MG/3ML) 0.083% nebulizer solution Commonly known as: PROVENTIL  Take 3 mLs (2.5 mg total) by nebulization every 6 (six) hours as needed for wheezing or shortness of breath.   atorvastatin  20 MG tablet Commonly known as: LIPITOR Take 1 tablet (20 mg total) by mouth daily.   cholecalciferol  25 MCG (1000 UNIT) tablet Commonly known as: VITAMIN D3 Take 2,000 Units by mouth daily.   levothyroxine  75 MCG tablet Commonly known as: SYNTHROID  Take 1 tablet (75 mcg total) by mouth daily before breakfast.   naltrexone  50 MG tablet Commonly known as: DEPADE TAKE 1 TABLET BY MOUTH EVERY DAY   sertraline  100 MG tablet Commonly known as: ZOLOFT  Take 200 mg by mouth daily.   valsartan  40 MG tablet Commonly known as: DIOVAN  Take 1 tablet (40 mg total) by mouth daily.    Review of systems negative except as noted in HPI / PMHx or noted below:  Review of Systems  Constitutional: Negative.   HENT: Negative.    Eyes: Negative.   Respiratory: Negative.    Cardiovascular: Negative.   Gastrointestinal: Negative.   Genitourinary: Negative.   Musculoskeletal: Negative.   Skin: Negative.   Neurological: Negative.   Endo/Heme/Allergies: Negative.   Psychiatric/Behavioral: Negative.      Family History  Problem Relation Age of Onset   Cancer Mother        lung   Heart disease Father    Hyperlipidemia Sister    Arthritis Sister    Hyperlipidemia Sister    Arthritis Sister     Hyperlipidemia Brother    Arthritis Brother    Arthritis Brother    Hyperlipidemia Brother    Hyperlipidemia Brother    Arthritis Brother    Cancer Paternal Grandmother        uterine   Stroke Paternal Grandfather     Social History   Socioeconomic History   Marital status: Divorced    Spouse name: Not on file   Number of children: Not on file   Years of education: Not on file   Highest education level: Not on file  Occupational History   Occupation: special ed runner, 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  Tobacco Use   Smoking status: Never   Smokeless tobacco: Never  Vaping Use  Vaping status: Never Used  Substance and Sexual Activity   Alcohol use: Yes    Alcohol/week: 6.0 standard drinks of alcohol    Types: 6 Cans of beer per week    Comment: on weekends   Drug use: No   Sexual activity: Never  Other Topics Concern   Not on file  Social History Narrative   Not on file   Social Drivers of Health   Tobacco Use: Low Risk (02/18/2024)   Patient History    Smoking Tobacco Use: Never    Smokeless Tobacco Use: Never    Passive Exposure: Not on file  Financial Resource Strain: Not on file  Food Insecurity: Not on file  Transportation Needs: Not on file  Physical Activity: Not on file  Stress: Not on file  Social Connections: Not on file  Intimate Partner Violence: Not on file  Depression (PHQ2-9): Medium Risk (08/26/2023)   Depression (PHQ2-9)    PHQ-2 Score: 6  Alcohol Screen: Not on file  Housing: Not on file  Utilities: Not on file  Health Literacy: Not on file    Environmental and Social history  Lives in a apartment with a dry environment, 2 cats located inside the household, carpet in the bedroom, no plastic on the bed, no plastic on the pillow, no smoking inside the household, and employment as a lawyer.  Objective:   Vitals:   02/18/24 1449  BP: 132/64  Pulse: 93  Resp: 16  Temp: 98.9 F (37.2 C)  SpO2: 96%     Weight: 272 lb (123.4  kg)  Physical Exam Constitutional:      Appearance: She is not diaphoretic.  HENT:     Head: Normocephalic.     Right Ear: Tympanic membrane, ear canal and external ear normal.     Left Ear: Tympanic membrane, ear canal and external ear normal.     Nose: Nose normal. No mucosal edema or rhinorrhea.     Mouth/Throat:     Pharynx: Uvula midline. No oropharyngeal exudate.  Eyes:     Conjunctiva/sclera: Conjunctivae normal.  Neck:     Thyroid : No thyromegaly.     Trachea: Trachea normal. No tracheal tenderness or tracheal deviation.  Cardiovascular:     Rate and Rhythm: Normal rate and regular rhythm.     Heart sounds: Normal heart sounds, S1 normal and S2 normal. No murmur heard. Pulmonary:     Effort: No respiratory distress.     Breath sounds: Normal breath sounds. No stridor. No wheezing or rales.  Lymphadenopathy:     Head:     Right side of head: No tonsillar adenopathy.     Left side of head: No tonsillar adenopathy.     Cervical: No cervical adenopathy.  Skin:    Findings: Rash (Bilateral lower extremity diffuse erythema with slow capillary refill and edema involving feet, ankles, calves.) present. No erythema.     Nails: There is no clubbing.  Neurological:     Mental Status: She is alert.     Diagnostics: Allergy skin tests were not performed.   Spirometry was performed and demonstrated an FEV1 of 1.68 @ 73 % of predicted. FEV1/FVC = 0.74  Results of blood tests obtained 26 August 2023 identifies creatinine 0.81 mg/DL, AST 53L/O, ALT 58L/O, WBC 14.3, absolute eosinophil 200, absolute lymphocyte 1900, hemoglobin 15.2, platelet 254, FT4 1.21 NG/DL, TSH 7.289 IU/ML  Results of the chest x-ray obtained 27 December 2022 identifies the following:  No consolidation, pneumothorax or effusion.  No edema. Films are under penetrated. Evaluation limited by motion. Mild peribronchial thickening.   Assessment and Plan:    1. Not well controlled mild persistent asthma   2.  Perennial allergic rhinitis   3. Adverse food reaction, subsequent encounter   4. Stasis dermatitis of both legs   5. Class 3 severe obesity due to excess calories with serious comorbidity and body mass index (BMI) of 45.0 to 49.9 in adult (HCC)   6. Elevated liver function tests     1. Return to clinic for skin testing (no antihistamines)  2. Treat and prevent inflammation of airway:   A. OTC Flonase  Sensimist - 2 sprays each nostril 1 time per day  B. Qvar  80 - 2 inhalations 1 time per day (empty lungs)  3. If needed:   A. Airsupra  - 2 inhalations every 4-6 hours  B. Cetirizine  10 mg - 1 tablet 1 time per day  C. Epi-pen, benadryl , MD/ER evaluation for allergic reaction  4. Obtain a comfortable set of compression stockings  5.  Make a plan for very slow and consistent weight loss and progressive exercise program  6. Influenza = Tamiflu. Covid = Paxlovid  Jessica Brennan would do better if she used anti-inflammatory medicines for her airway on a consistent basis and I had a long talk with her today about the need to use Flonase  and Qvar  preventatively with an assessment of her response to these medicines occurring within about 4 weeks.  We will define her aeroallergen hypersensitivity profile to provide her allergen avoidance measures and considering other therapy pending her response to treatment noted above.  She has significant lower extremity edema with erythema and I am assuming at this point in time that she has some stasis dermatitis but it is not entirely clear why she has such significant edema.  Pending her response to a simple maneuver of using compression stockings we may need to have her undergo further evaluation for this issue including an echocardiogram and vascular studies.  And pending her results to skin testing we may need to have her further evaluated for alpha gal syndrome by checking IgE antibodies against alpha gal.  Finally, she does have elevated liver function tests and I  will review her previous evaluation to see if this has been worked up.  She does appear to have obesity which may be contributing to the condition of fatty liver.  I did make some general recommendations about undergoing a very slow but consistent weight loss program and a progressive exercise program.  Camellia DOROTHA Denis, MD Allergy / Immunology [facility] [place]  "

## 2024-02-24 ENCOUNTER — Encounter: Payer: Self-pay | Admitting: Allergy and Immunology

## 2024-03-02 ENCOUNTER — Telehealth: Payer: Self-pay

## 2024-03-02 NOTE — Telephone Encounter (Signed)
 Patient is requesting PCP recheck thyroid  because she is losing hair, did not see orders in chart, please put in.

## 2024-03-02 NOTE — Telephone Encounter (Unsigned)
 Copied from CRM [id number]. Topic: Appointments - Appointment Scheduling >> Mar 02, 2024 11:30 AM Sophia H wrote: Patient/patient representative is calling to schedule an appointment. Refer to attachments for appointment information.   General check up per patient -  Tuesday Mar 24, 2024 Arrive by 12:45 PMAppt at 1:00 PM (20 min)  Labs - Tuesday Mar 17, 2024 Arrive by 8:05 AMAppt at 8:20 AM (15 min)  Patient is requesting PCP recheck thyroid  because she is losing hair, did not see orders in chart, please put in.

## 2024-03-04 NOTE — Telephone Encounter (Signed)
 Patient has been called and a message left for them to return the call to the office. Ok for E2C2 to review if/when they return the call. Please do not transfer to CAL rather send a CRM if needed only.  Please advise patient that PCP is deferring any needed labs until the day of her office visit. She will only order during a visit at this time. Her lab appointment that was scheduled for 03/17/2024 has been cancelled due to this. Please advise patient of her PCP appt and encourage her to be sure to attend.

## 2024-03-17 ENCOUNTER — Ambulatory Visit: Admitting: Allergy and Immunology

## 2024-03-17 ENCOUNTER — Other Ambulatory Visit

## 2024-03-21 NOTE — Patient Instructions (Incomplete)
 Managing Depression, Adult Depression is a mental health condition that affects your thoughts, feelings, and actions. Being diagnosed with depression can bring you relief if you did not know why you have felt or behaved a certain way. It could also leave you feeling overwhelmed. Finding ways to manage your symptoms can help you feel more positive about your future. How to manage lifestyle changes Being depressed is difficult. Depression can increase the level of everyday stress. Stress can make depression symptoms worse. You may believe your symptoms cannot be managed or will never improve. However, there are many things you can try to help manage your symptoms. There is hope. Managing stress  Stress is your body's reaction to life changes and events, both good and bad. Stress can add to your feelings of depression. Learning to manage your stress can help lessen your feelings of depression. Try some of the following approaches to reducing your stress (stress reduction techniques): Listen to music that you enjoy and that inspires you. Try using a meditation app or take a meditation class. Develop a practice that helps you connect with your spiritual self. Walk in nature, pray, or go to a place of worship. Practice deep breathing. To do this, inhale slowly through your nose. Pause at the top of your inhale for a few seconds and then exhale slowly, letting yourself relax. Repeat this three or four times. Practice yoga to help relax and work your muscles. Choose a stress reduction technique that works for you. These techniques take time and practice to develop. Set aside 5-15 minutes a day to do them. Therapists can offer training in these techniques. Do these things to help manage stress: Keep a journal. Know your limits. Set healthy boundaries for yourself and others, such as saying "no" when you think something is too much. Pay attention to how you react to certain situations. You may not be able to  control everything, but you can change your reaction. Add humor to your life by watching funny movies or shows. Make time for activities that you enjoy and that relax you. Spend less time using electronics, especially at night before bed. The light from screens can make your brain think it is time to get up rather than go to bed.  Medicines Medicines, such as antidepressants, are often a part of treatment for depression. Talk with your pharmacist or health care provider about all the medicines, supplements, and herbal products that you take, their possible side effects, and what medicines and other products are safe to take together. Make sure to report any side effects you may have to your health care provider. Relationships Your health care provider may suggest family therapy, couples therapy, or individual therapy as part of your treatment. How to recognize changes Everyone responds differently to treatment for depression. As you recover from depression, you may start to: Have more interest in doing activities. Feel more hopeful. Have more energy. Eat a more regular amount of food. Have better mental focus. It is important to recognize if your depression is not getting better or is getting worse. The symptoms you had in the beginning may return, such as: Feeling tired. Eating too much or too little. Sleeping too much or too little. Feeling restless, agitated, or hopeless. Trouble focusing or making decisions. Having unexplained aches and pains. Feeling irritable, angry, or aggressive. If you or your family members notice these symptoms coming back, let your health care provider know right away. Follow these instructions at home: Activity Try to  get some form of exercise each day, such as walking. Try yoga, mindfulness, or other stress reduction techniques. Participate in group activities if you are able. Lifestyle Get enough sleep. Cut down on or stop using caffeine, tobacco,  alcohol, and any other harmful substances. Eat a healthy diet that includes plenty of vegetables, fruits, whole grains, low-fat dairy products, and lean protein. Limit foods that are high in solid fats, added sugar, or salt (sodium). General instructions Take over-the-counter and prescription medicines only as told by your health care provider. Keep all follow-up visits. It is important for your health care provider to check on your mood, behavior, and medicines. Your health care provider may need to make changes to your treatment. Where to find support Talking to others  Friends and family members can be sources of support and guidance. Talk to trusted friends or family members about your condition. Explain your symptoms and let them know that you are working with a health care provider to treat your depression. Tell friends and family how they can help. Finances Find mental health providers that fit with your financial situation. Talk with your health care provider if you are worried about access to food, housing, or medicine. Call your insurance company to learn about your co-pays and prescription plan. Where to find more information You can find support in your area from: Anxiety and Depression Association of America (ADAA): adaa.org Mental Health America: mentalhealthamerica.net The [employer] on Mental Illness: nami.org Contact a health care provider if: You stop taking your antidepressant medicines, and you have any of these symptoms: Nausea. Headache. Light-headedness. Chills and body aches. Not being able to sleep (insomnia). You or your friends and family think your depression is getting worse. Get help right away if: You have thoughts of hurting yourself or others. Get help right away if you feel like you may hurt yourself or others, or have thoughts about taking your own life. Go to your nearest emergency room or: Call 911. Call the National Suicide Prevention Lifeline at  (215) 435-0408 or 988. This is open 24 hours a day. Text the Crisis Text Line at 318 581 7774. This information is not intended to replace advice given to you by your health care provider. Make sure you discuss any questions you have with your health care provider. Document Revised: 06/20/2021 Document Reviewed: 06/20/2021 Elsevier Patient Education  2024 [employer].

## 2024-03-24 ENCOUNTER — Ambulatory Visit: Admitting: Nurse Practitioner

## 2024-03-24 ENCOUNTER — Encounter: Payer: Self-pay | Admitting: Nurse Practitioner

## 2024-03-24 DIAGNOSIS — E782 Mixed hyperlipidemia: Secondary | ICD-10-CM

## 2024-03-24 DIAGNOSIS — R7309 Other abnormal glucose: Secondary | ICD-10-CM | POA: Diagnosis not present

## 2024-03-24 DIAGNOSIS — I10 Essential (primary) hypertension: Secondary | ICD-10-CM

## 2024-03-24 DIAGNOSIS — Z1231 Encounter for screening mammogram for malignant neoplasm of breast: Secondary | ICD-10-CM

## 2024-03-24 DIAGNOSIS — F322 Major depressive disorder, single episode, severe without psychotic features: Secondary | ICD-10-CM | POA: Diagnosis not present

## 2024-03-24 DIAGNOSIS — E039 Hypothyroidism, unspecified: Secondary | ICD-10-CM

## 2024-03-24 DIAGNOSIS — J4520 Mild intermittent asthma, uncomplicated: Secondary | ICD-10-CM | POA: Diagnosis not present

## 2024-03-24 DIAGNOSIS — F1020 Alcohol dependence, uncomplicated: Secondary | ICD-10-CM

## 2024-03-24 DIAGNOSIS — E559 Vitamin D deficiency, unspecified: Secondary | ICD-10-CM

## 2024-03-24 DIAGNOSIS — Z1211 Encounter for screening for malignant neoplasm of colon: Secondary | ICD-10-CM

## 2024-03-24 MED ORDER — SERTRALINE HCL 100 MG PO TABS: 200.0000 mg | ORAL_TABLET | Freq: Every day | ORAL | 2 refills | Status: AC

## 2024-03-24 NOTE — Assessment & Plan Note (Signed)
 Chronic, stable at present. Continue current inhaler regimen and collaboration with pulmonary. Recent notes reviewed.

## 2024-03-24 NOTE — Progress Notes (Signed)
 "  BP 123/74   Pulse 81   Temp 98.6 F (37 C) (Oral)   Ht 5' 2.1 (1.577 m)   Wt 264 lb 14.4 oz (120.2 kg)   LMP 12/24/2012   SpO2 95%   BMI 48.29 kg/m    Subjective:    Patient ID: Jessica Brennan, female    DOB: [birth year], 59 y.o.   MRN: [id number]  HPI: Jessica Brennan is a 59 y.o. female  Chief Complaint  Patient presents with   Depression   Hyperlipidemia   Hypertension   Hypothyroidism   HYPERTENSION / HYPERLIPIDEMIA Taking Atorvastatin  and Valsartan .  Satisfied with current treatment? yes Duration of hypertension: chronic BP monitoring frequency: not checking BP range:  BP medication side effects: no Duration of hyperlipidemia: chronic Cholesterol medication side effects: no Cholesterol supplements: none Medication compliance: good compliance Aspirin: no Recent stressors: no Recurrent headaches: no Visual changes: no Palpitations: no Dyspnea: no Chest pain: no Lower extremity edema: no Dizzy/lightheaded: no   ASTHMA Follows with pulmonary, last visit was 01/21/24. Started on [place]. Sees allergist tomorrow for further assessment. Asthma status: stable Satisfied with current treatment?: yes Albuterol /rescue inhaler frequency: none recently Dyspnea frequency: none Wheezing frequency: none Cough frequency: none Nocturnal symptom frequency: none Limitation of activity: no Current upper respiratory symptoms: no Triggers: allergies, vary Last Spirometry: if pulmonary Aerochamber/spacer use: no Visits to ER or Urgent Care in past year: no Pneumovax: Up to Date Influenza: Up to Date   Impaired Fasting Glucose HbA1C:  Lab Results  Component Value Date   HGBA1C 5.6 08/26/2023  Duration of elevated blood sugar: years Polydipsia: no Polyuria: no Weight change: no Visual disturbance: no Glucose Monitoring: no    Accucheck frequency: Not Checking    Fasting glucose:     Post prandial:  Diabetic Education: Not Completed Family history of  diabetes: yes    HYPOTHYROIDISM Takes Levothyroxine  75 MCG daily.  Thyroid  control status:stable Satisfied with current treatment? yes Medication side effects: no Medication compliance: good compliance Etiology of hypothyroidism: unknown Recent dose adjustment:no Fatigue: yes a little Cold intolerance: yes due to weather Heat intolerance: no Weight gain: no Weight loss: no Constipation: no Diarrhea/loose stools: no Palpitations: no Lower extremity edema: no Anxiety/depressed mood: yes, at baseline  DEPRESSION Follows with Dr. Chipper, but would like to stop going to her for now and have PCP takeover Zoloft  as feels stable on this regimen. Last visit was 01/14/24. Taking Zoloft  150 MG daily. Current political climate is a stressor. Took Naltrexone  in past for alcohol use. Currently has not had a drink in 3 months. Follows with therapist which she finds most benefit from. Mood status: exacerbated Satisfied with current treatment?: no Symptom severity: moderate  Duration of current treatment : chronic Side effects: no Medication compliance: good compliance Psychotherapy/counseling: yes, current Previous psychiatric medications: effexor  Depressed mood: occasional Anxious mood: occasional Anhedonia: no Significant weight loss or gain: no Insomnia: none Fatigue: yes Feelings of worthlessness or guilt: no Impaired concentration/indecisiveness: no Suicidal ideations: none Hopelessness: no Crying spells: no    03/24/2024    1:09 PM 08/26/2023    1:08 PM 01/07/2023   10:23 AM 12/24/2022   10:52 AM 12/19/2021   10:19 AM  Depression screen PHQ 2/9  Decreased Interest 2 1 2  0 1  Down, Depressed, Hopeless 1 1 2  0 1  PHQ - 2 Score 3 2 4  0 2  Altered sleeping 0 0 0 0 0  Tired, decreased energy 0 2 3 0 1  Change  in appetite 1 2 1  0 0  Feeling bad or failure about yourself  0 0 2 0 1  Trouble concentrating 0 0 0 0 0  Moving slowly or fidgety/restless 0 0 0 0 0  Suicidal thoughts  0 0 0 0 0  PHQ-9 Score 4 6  10   0  4   Difficult doing work/chores Somewhat difficult Somewhat difficult Not difficult at all  Not difficult at all     Data saved with a previous flowsheet row definition       03/24/2024    1:09 PM 08/26/2023    1:08 PM 01/07/2023   10:23 AM 12/19/2021   10:20 AM  GAD 7 : Generalized Anxiety Score  Nervous, Anxious, on Edge 1 1  1  2    Control/stop worrying 1 2  1  2    Worry too much - different things 0 2  1  1    Trouble relaxing 0 0  0  0   Restless 0 0  0  0   Easily annoyed or irritable 0 0  0  0   Afraid - awful might happen 1 0  0  0   Total GAD 7 Score 3 5 3 5   Anxiety Difficulty Somewhat difficult Not difficult at all Not difficult at all Not difficult at all     Data saved with a previous flowsheet row definition   Relevant past medical, surgical, family and social history reviewed and updated as indicated. Interim medical history since our last visit reviewed. Allergies and medications reviewed and updated.  Review of Systems  Constitutional:  Negative for activity change, appetite change, diaphoresis, fatigue and fever.  Respiratory:  Negative for cough, chest tightness and shortness of breath.   Cardiovascular:  Negative for chest pain, palpitations and leg swelling.  Gastrointestinal: Negative.   Endocrine: Negative for cold intolerance, heat intolerance, polydipsia, polyphagia and polyuria.  Neurological: Negative.   Psychiatric/Behavioral: Negative.     Per HPI unless specifically indicated above     Objective:    BP 123/74   Pulse 81   Temp 98.6 F (37 C) (Oral)   Ht 5' 2.1 (1.577 m)   Wt 264 lb 14.4 oz (120.2 kg)   LMP 12/24/2012   SpO2 95%   BMI 48.29 kg/m   Wt Readings from Last 3 Encounters:  03/24/24 264 lb 14.4 oz (120.2 kg)  02/18/24 272 lb (123.4 kg)  01/21/24 265 lb (120.2 kg)    Physical Exam Vitals and nursing note reviewed.  Constitutional:      General: She is awake. She is not in acute  distress.    Appearance: She is well-developed and well-groomed. She is obese. She is not ill-appearing or toxic-appearing.  HENT:     Head: Normocephalic.     Right Ear: Hearing and external ear normal.     Left Ear: Hearing and external ear normal.  Eyes:     General: Lids are normal.        Right eye: No discharge.        Left eye: No discharge.     Conjunctiva/sclera: Conjunctivae normal.     Pupils: Pupils are equal, round, and reactive to light.  Neck:     Thyroid : No thyromegaly.     Vascular: No carotid bruit.  Cardiovascular:     Rate and Rhythm: Normal rate and regular rhythm.     Heart sounds: Normal heart sounds. No murmur heard.    No gallop.  Pulmonary:     Effort: Pulmonary effort is normal. No accessory muscle usage or respiratory distress.     Breath sounds: Normal breath sounds.  Abdominal:     General: Bowel sounds are normal. There is no distension.     Palpations: Abdomen is soft.     Tenderness: There is no abdominal tenderness.  Musculoskeletal:     Cervical back: Normal range of motion and neck supple.     Right lower leg: No edema.     Left lower leg: No edema.  Lymphadenopathy:     Cervical: No cervical adenopathy.  Skin:    General: Skin is warm and dry.  Neurological:     Mental Status: She is alert and oriented to person, place, and time.     Deep Tendon Reflexes: Reflexes are normal and symmetric.     Reflex Scores:      Brachioradialis reflexes are 2+ on the right side and 2+ on the left side.      Patellar reflexes are 2+ on the right side and 2+ on the left side. Psychiatric:        Attention and Perception: Attention normal.        Mood and Affect: Mood normal.        Speech: Speech normal.        Behavior: Behavior normal. Behavior is cooperative.        Thought Content: Thought content normal.    Results for orders placed or performed in visit on 08/26/23  Bayer DCA Hb A1c Waived   Collection Time: 08/26/23  1:44 PM  Result Value  Ref Range   HB A1C (BAYER DCA - WAIVED) 5.6 4.8 - 5.6 %  T4, free   Collection Time: 08/26/23  1:45 PM  Result Value Ref Range   Free T4 1.21 0.82 - 1.77 ng/dL  CBC with Differential/Platelet   Collection Time: 08/26/23  1:45 PM  Result Value Ref Range   WBC 14.3 (H) 3.4 - 10.8 x10E3/uL   RBC 5.03 3.77 - 5.28 x10E6/uL   Hemoglobin 15.2 11.1 - 15.9 g/dL   Hematocrit 53.7 65.9 - 46.6 %   MCV 92 79 - 97 fL   MCH 30.2 26.6 - 33.0 pg   MCHC 32.9 31.5 - 35.7 g/dL   RDW 86.4 88.2 - 84.5 %   Platelets 254 150 - 450 x10E3/uL   Neutrophils 79 Not Estab. %   Lymphs 13 Not Estab. %   Monocytes 7 Not Estab. %   Eos 1 Not Estab. %   Basos 0 Not Estab. %   Neutrophils Absolute 11.2 (H) 1.4 - 7.0 x10E3/uL   Lymphocytes Absolute 1.9 0.7 - 3.1 x10E3/uL   Monocytes Absolute 0.9 0.1 - 0.9 x10E3/uL   EOS (ABSOLUTE) 0.2 0.0 - 0.4 x10E3/uL   Basophils Absolute 0.0 0.0 - 0.2 x10E3/uL   Immature Granulocytes 0 Not Estab. %   Immature Grans (Abs) 0.0 0.0 - 0.1 x10E3/uL  Comprehensive metabolic panel with GFR   Collection Time: 08/26/23  1:45 PM  Result Value Ref Range   Glucose 105 (H) 70 - 99 mg/dL   BUN 11 8 - 27 mg/dL   Creatinine, Ser 9.18 0.57 - 1.00 mg/dL   eGFR 83 >40 fO/fpw/8.26   BUN/Creatinine Ratio 14 12 - 28   Sodium 136 134 - 144 mmol/L   Potassium 4.6 3.5 - 5.2 mmol/L   Chloride 97 96 - 106 mmol/L   CO2 19 (L) 20 - 29 mmol/L  Calcium  9.2 8.7 - 10.3 mg/dL   Total Protein 7.8 6.0 - 8.5 g/dL   Albumin 4.9 3.8 - 4.9 g/dL   Globulin, Total 2.9 1.5 - 4.5 g/dL   Bilirubin Total 0.5 0.0 - 1.2 mg/dL   Alkaline Phosphatase 81 44 - 121 IU/L   AST 46 (H) 0 - 40 IU/L   ALT 41 (H) 0 - 32 IU/L  TSH   Collection Time: 08/26/23  1:45 PM  Result Value Ref Range   TSH 2.710 0.450 - 4.500 uIU/mL  Lipid Panel w/o Chol/HDL Ratio   Collection Time: 08/26/23  1:45 PM  Result Value Ref Range   Cholesterol, Total 243 (H) 100 - 199 mg/dL   Triglycerides 95 0 - 149 mg/dL   HDL 870 >60 mg/dL    VLDL Cholesterol Cal 16 5 - 40 mg/dL   LDL Chol Calc (NIH) 98 0 - 99 mg/dL  Magnesium    Collection Time: 08/26/23  1:45 PM  Result Value Ref Range   Magnesium  2.0 1.6 - 2.3 mg/dL  VITAMIN D  25 Hydroxy (Vit-D Deficiency, Fractures)   Collection Time: 08/26/23  1:45 PM  Result Value Ref Range   Vit D, 25-Hydroxy 34.7 30.0 - 100.0 ng/mL  Vitamin B12   Collection Time: 08/26/23  1:45 PM  Result Value Ref Range   Vitamin B-12 575 232 - 1,245 pg/mL      Assessment & Plan:   Problem List Items Addressed This Visit       Cardiovascular and Mediastinum   Hypertension   Chronic, stable.  BP at goal in office today.  Recommend she monitor BP at least a few mornings a week at home and document.  DASH diet at home. Refills on Valsartan  sent as needed, recommend she take daily.  Educated her at length on this change and medication.  Labs today: CBC and CMP. Obtain urine ALB next visit. Amlodipine  caused worsening edema in past.         Respiratory   Mild intermittent asthma   Chronic, stable at present. Continue current inhaler regimen and collaboration with pulmonary. Recent notes reviewed.        Endocrine   Hypothyroidism   Chronic, ongoing.  Continue current medication regimen, adjust as needed.  Labs today.      Relevant Orders   TSH   T4, free     Other   Vitamin D  deficiency   Chronic, ongoing.  Continue daily supplement and recheck level next visit, adjust as needed. Family history of osteoporosis.      Morbid obesity (HCC) - Primary   BMI 48.29 with HTN and HLD.  Recommended eating smaller high protein, low fat meals more frequently and exercising 30 mins a day 5 times a week with a goal of 10-15lb weight loss in the next 3 months. Patient voiced their understanding and motivation to adhere to these recommendations.       Hyperlipidemia   Chronic, ongoing.  Continue current medication regimen and adjust as needed.  Lipid panel today.      Relevant Orders    Comprehensive metabolic panel with GFR   Lipid Panel w/o Chol/HDL Ratio   Elevated hemoglobin A1c measurement   Ongoing, with last check 5.6%.  Recheck next visit.  Recommend continued cessation of alcohol use.      Depression   Chronic, stable.  Follows with psychiatrist, Dr. Chipper, and currently taking Sertraline  150 mg daily. She is requesting PCP take over regimen for now, will do this  but if worsening symptoms get back into psychiatry.  Recommend continued alcohol cessation.  Recommend AA meetings, which offered her benefit in past. She is seeing therapist, continue this. She finds a lot of benefit from this.      Relevant Medications   sertraline  (ZOLOFT ) 100 MG tablet   Alcohol use disorder, moderate, dependence (HCC)   Chronic, stable with no alcohol use in 3 months. Praised for this.  Restart Naltrexone  as needed, this worked well in past.  Recommend she attend AA and work towards complete cessation.      Other Visit Diagnoses       Colon cancer screening       Cologuard ordered, discussed with patient.   Relevant Orders   Cologuard     Encounter for screening mammogram for malignant neoplasm of breast       Mammogram ordered today and instructed how to schedule.   Relevant Orders   MM 3D SCREENING MAMMOGRAM BILATERAL BREAST         Follow up plan: Return in about 6 months (around 09/21/2024) for HTN/HLD, Depression, ANXIETY, Hypothyroid, IFG.      "

## 2024-03-24 NOTE — Assessment & Plan Note (Signed)
 Chronic, stable.  BP at goal in office today.  Recommend she monitor BP at least a few mornings a week at home and document.  DASH diet at home. Refills on Valsartan  sent as needed, recommend she take daily.  Educated her at length on this change and medication.  Labs today: CBC and CMP. Obtain urine ALB next visit. Amlodipine  caused worsening edema in past.

## 2024-03-24 NOTE — Assessment & Plan Note (Signed)
 Chronic, stable with no alcohol use in 3 months. Praised for this.  Restart Naltrexone  as needed, this worked well in past.  Recommend she attend AA and work towards complete cessation.

## 2024-03-24 NOTE — Assessment & Plan Note (Signed)
 Chronic, stable.  Follows with psychiatrist, Dr. Chipper, and currently taking Sertraline  150 mg daily. She is requesting PCP take over regimen for now, will do this but if worsening symptoms get back into psychiatry.  Recommend continued alcohol cessation.  Recommend AA meetings, which offered her benefit in past. She is seeing therapist, continue this. She finds a lot of benefit from this.

## 2024-03-24 NOTE — Assessment & Plan Note (Signed)
 BMI 48.29 with HTN and HLD.  Recommended eating smaller high protein, low fat meals more frequently and exercising 30 mins a day 5 times a week with a goal of 10-15lb weight loss in the next 3 months. Patient voiced their understanding and motivation to adhere to these recommendations.

## 2024-03-24 NOTE — Assessment & Plan Note (Signed)
 Chronic, ongoing.  Continue current medication regimen, adjust as needed.  Labs today.

## 2024-03-24 NOTE — Assessment & Plan Note (Signed)
 Ongoing, with last check 5.6%.  Recheck next visit.  Recommend continued cessation of alcohol use.

## 2024-03-24 NOTE — Assessment & Plan Note (Signed)
 Chronic, ongoing.  Continue current medication regimen and adjust as needed. Lipid panel today.

## 2024-03-24 NOTE — Assessment & Plan Note (Signed)
 Chronic, ongoing.  Continue daily supplement and recheck level next visit, adjust as needed. Family history of osteoporosis.

## 2024-03-25 ENCOUNTER — Ambulatory Visit: Payer: Self-pay | Admitting: Nurse Practitioner

## 2024-03-25 ENCOUNTER — Ambulatory Visit: Admitting: Allergy

## 2024-03-25 ENCOUNTER — Encounter: Payer: Self-pay | Admitting: Allergy

## 2024-03-25 DIAGNOSIS — J3089 Other allergic rhinitis: Secondary | ICD-10-CM

## 2024-03-25 DIAGNOSIS — T7819XD Other adverse food reactions, not elsewhere classified, subsequent encounter: Secondary | ICD-10-CM

## 2024-03-25 DIAGNOSIS — T7800XD Anaphylactic reaction due to unspecified food, subsequent encounter: Secondary | ICD-10-CM

## 2024-03-25 DIAGNOSIS — E039 Hypothyroidism, unspecified: Secondary | ICD-10-CM

## 2024-03-25 DIAGNOSIS — J4530 Mild persistent asthma, uncomplicated: Secondary | ICD-10-CM

## 2024-03-25 LAB — TSH: TSH: 5.75 u[IU]/mL — ABNORMAL HIGH (ref 0.450–4.500)

## 2024-03-25 LAB — LIPID PANEL W/O CHOL/HDL RATIO
Cholesterol, Total: 226 mg/dL — ABNORMAL HIGH (ref 100–199)
HDL: 108 mg/dL
LDL Chol Calc (NIH): 104 mg/dL — ABNORMAL HIGH (ref 0–99)
Triglycerides: 82 mg/dL (ref 0–149)
VLDL Cholesterol Cal: 14 mg/dL (ref 5–40)

## 2024-03-25 LAB — COMPREHENSIVE METABOLIC PANEL WITH GFR
ALT: 19 [IU]/L (ref 0–32)
AST: 18 [IU]/L (ref 0–40)
Albumin: 4.3 g/dL (ref 3.9–4.9)
Alkaline Phosphatase: 69 [IU]/L (ref 49–135)
BUN/Creatinine Ratio: 17 (ref 12–28)
BUN: 14 mg/dL (ref 8–27)
Bilirubin Total: 0.5 mg/dL (ref 0.0–1.2)
CO2: 20 mmol/L (ref 20–29)
Calcium: 9 mg/dL (ref 8.7–10.3)
Chloride: 95 mmol/L — ABNORMAL LOW (ref 96–106)
Creatinine, Ser: 0.84 mg/dL (ref 0.57–1.00)
Globulin, Total: 2.9 g/dL (ref 1.5–4.5)
Glucose: 107 mg/dL — ABNORMAL HIGH (ref 70–99)
Potassium: 4.4 mmol/L (ref 3.5–5.2)
Sodium: 136 mmol/L (ref 134–144)
Total Protein: 7.2 g/dL (ref 6.0–8.5)
eGFR: 79 mL/min/{1.73_m2}

## 2024-03-25 LAB — T4, FREE: Free T4: 1.22 ng/dL (ref 0.82–1.77)

## 2024-03-25 MED ORDER — LEVOTHYROXINE SODIUM 88 MCG PO TABS: 88.0000 ug | ORAL_TABLET | Freq: Every day | ORAL | 3 refills | Status: AC

## 2024-03-25 NOTE — Patient Instructions (Addendum)
 03/25/2024 skin testing positive to grass, dust mites, cats.  Borderline to weed pollen. Borderline to pork, beef, lamb.   Results given.  Environmental allergies Start environmental control measures as below. Use over the counter antihistamines such as Zyrtec  (cetirizine ), Claritin  (loratadine ), Allegra (fexofenadine), or Xyzal (levocetirizine) daily as needed. May take twice a day during allergy flares. May switch antihistamines every few months. Use Flonase  (fluticasone ) nasal spray 1-2 sprays per nostril once a day as needed for nasal congestion.  Nasal saline spray (i.e., Simply Saline) or nasal saline lavage (i.e., NeilMed) is recommended as needed and prior to medicated nasal sprays.  Foods Continue to avoid mammalian meat. For mild symptoms you can take over the counter antihistamines (zyrtec  10mg  to 20mg ) and monitor symptoms closely.  If symptoms worsen or if you have severe symptoms including breathing issues, throat closure, significant swelling, whole body hives, severe diarrhea and vomiting, lightheadedness then use epinephrine  and seek immediate medical care afterwards. Emergency action plan in place.  Get bloodwork. We are ordering labs, so please allow 1-2 weeks for the results to come back. With the newly implemented Cures Act, the labs might be visible to you at the same time that they become visible to me. However, I will not address the results until all of the results are back, so please be patient.  In the meantime, continue recommendations in your patient instructions, including avoidance measures (if applicable), until you hear from me.  Asthma Daily controller medication(s): Qvar  80mcg 2 puffs once a day and rinse mouth after each use.  May use Airsupra  rescue inhaler 2 puffs every 4 to 6 hours as needed for shortness of breath, chest tightness, coughing, and wheezing. Do not use more than 12 puffs in 24 hours. May use Airsupra  rescue inhaler 2 puffs 5 to 15 minutes  prior to strenuous physical activities. Rinse mouth after each use.  Monitor frequency of use - if you need to use it more than twice per week on a consistent basis let us  know.   Follow up with Dr. Kozlow in 3 months or sooner if needed.  Reducing Pollen Exposure Pollen seasons: trees (spring), grass (summer) and ragweed/weeds (fall). Keep windows closed in your home and car to lower pollen exposure.  Install air conditioning in the bedroom and throughout the house if possible.  Avoid going out in dry windy days - especially early morning. Pollen counts are highest between 5 - 10 AM and on dry, hot and windy days.  Save outside activities for late afternoon or after a heavy rain, when pollen levels are lower.  Avoid mowing of grass if you have grass pollen allergy. Be aware that pollen can also be transported indoors on people and pets.  Dry your clothes in an automatic dryer rather than hanging them outside where they might collect pollen.  Rinse hair and eyes before bedtime.  Control of House Dust Mite Allergen Dust mite allergens are a common trigger of allergy and asthma symptoms. While they can be found throughout the house, these microscopic creatures thrive in warm, humid environments such as bedding, upholstered furniture and carpeting. Because so much time is spent in the bedroom, it is essential to reduce mite levels there.  Encase pillows, mattresses, and box springs in special allergen-proof fabric covers or airtight, zippered plastic covers.  Bedding should be washed weekly in hot water (130 F) and dried in a hot dryer. Allergen-proof covers are available for comforters and pillows that cant be regularly washed.  Wash  the allergy-proof covers every few months. Minimize clutter in the bedroom. Keep pets out of the bedroom.  Keep humidity less than 50% by using a dehumidifier or air conditioning. You can buy a humidity measuring device called a hygrometer to monitor this.  If  possible, replace carpets with hardwood, linoleum, or washable area rugs. If that's not possible, vacuum frequently with a vacuum that has a HEPA filter. Remove all upholstered furniture and non-washable window drapes from the bedroom. Remove all non-washable stuffed toys from the bedroom.  Wash stuffed toys weekly.  Pet Allergen Avoidance: Contrary to popular opinion, there are no hypoallergenic breeds of dogs or cats. That is because people are not allergic to an animals hair, but to an allergen found in the animal's saliva, dander (dead skin flakes) or urine. Pet allergy symptoms typically occur within minutes. For some people, symptoms can build up and become most severe 8 to 12 hours after contact with the animal. People with severe allergies can experience reactions in public places if dander has been transported on the pet owners clothing. Keeping an animal outdoors is only a partial solution, since homes with pets in the yard still have higher concentrations of animal allergens. Before getting a pet, ask your allergist to determine if you are allergic to animals. If your pet is already considered part of your family, try to minimize contact and keep the pet out of the bedroom and other rooms where you spend a great deal of time. As with dust mites, vacuum carpets often or replace carpet with a hardwood floor, tile or linoleum. High-efficiency particulate air (HEPA) cleaners can reduce allergen levels over time. While dander and saliva are the source of cat and dog allergens, urine is the source of allergens from rabbits, hamsters, mice and guinea pigs; so ask a non-allergic family member to clean the animals cage. If you have a pet allergy, talk to your allergist about the potential for allergy immunotherapy (allergy shots). This strategy can often provide long-term relief.

## 2024-03-25 NOTE — Progress Notes (Signed)
 Contacted via MyChart, however has not check MyChart since November 2024 so please call too just in case + will need lab only visit in 6 weeks:   Good day Jessica Brennan, your labs have returned: - Thyroid  level and TSH is slightly elevated. Free T4 is normal. I would like to adjust your Levothyroxine  dose a little to 88 MCG daily, stop 75 MCG tablet and ensure to take 30 minutes before other medications and food in morning. We will recheck levels outpatient on labs in 6 weeks. - Remainder of labs are stable with exception of lipid panel which shows some elevations. Please ensure you are taking Atorvastatin  daily as ordered and at next visit come fasting for recheck. We may need to adjust dose next visit if ongoing elevations. Any questions? Keep being stellar!!  Thank you for allowing me to participate in your care.  I appreciate you. Kindest regards, Therman Hughlett

## 2024-03-25 NOTE — Progress Notes (Signed)
 "  Skin testing note  RE: Jessica Brennan MRN: [id number] DOB: 1965-05-07 Date of Office Visit: 03/25/2024  Referring provider: Valerio Melanie DASEN, NP Primary care provider: Valerio Melanie DASEN, NP  Chief Complaint: skin testing  History of Present Illness: I had the pleasure of seeing Jessica Brennan for a skin testing visit at the [facility] on 03/25/2024. Jessica Brennan is a 59 y.o. female, who is being followed for allergic rhinitis, asthma, adverse food reaction. Her previous allergy office visit was on 02/18/2024 with Dr. Kozlow. Today is a skin testing visit.   Discussed the use of AI scribe software for clinical note transcription with the patient, who gave verbal consent to proceed.    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 After consuming bacon, Jessica Brennan experienced facial swelling, itching, and throat constriction, which made it difficult to speak. Jessica Brennan did not seek emergency care but took Benadryl  at home, which alleviated her symptoms. Since then, Jessica Brennan has avoided all mammalian meats, including beef, lamb, and pork, but tolerates dairy products without issue.  Jessica Brennan has a history of asthma, which may be exacerbated by environmental factors. Perfumes have become more bothersome.   Jessica Brennan has psoriasis, contributing to her dry skin. Jessica Brennan has two cats.  Jessica Brennan uses Zyrtec  as needed for her allergies and has an EpiPen  prescribed for potential severe allergic reactions.     Assessment and Plan: Harper is a 59 y.o. female with: Other allergic rhinitis 03/25/2024 skin testing positive to grass, dust mites, cats. Borderline to weed pollen. Start environmental control measures as below. Use over the counter antihistamines such as Zyrtec  (cetirizine ), Claritin  (loratadine ), Allegra (fexofenadine), or Xyzal (levocetirizine) daily as needed. May take twice a day during allergy flares. May switch antihistamines every few months. Use Flonase  (fluticasone ) nasal  spray 1-2 sprays per nostril once a day as needed for nasal congestion.  Nasal saline spray (i.e., Simply Saline) or nasal saline lavage (i.e., NeilMed) is recommended as needed and prior to medicated nasal sprays.  Adverse food reaction, subsequent encounter 03/25/2024 skin testing borderline positive to pork, beef, lamb.  Continue to avoid mammalian meat. For mild symptoms you can take over the counter antihistamines (zyrtec  10mg  to 20mg ) and monitor symptoms closely.  If symptoms worsen or if you have severe symptoms including breathing issues, throat closure, significant swelling, whole body hives, severe diarrhea and vomiting, lightheadedness then use epinephrine  and seek immediate medical care afterwards. Emergency action plan in place. Get bloodwork for alpha gal panel.   Mild persistent asthma without complication Daily controller medication(s): Qvar  80mcg 2 puffs once a day and rinse mouth after each use.  May use Airsupra  rescue inhaler 2 puffs every 4 to 6 hours as needed for shortness of breath, chest tightness, coughing, and wheezing. Do not use more than 12 puffs in 24 hours. May use Airsupra  rescue inhaler 2 puffs 5 to 15 minutes prior to strenuous physical activities. Rinse mouth after each use.  Monitor frequency of use - if you need to use it more than twice per week on a consistent basis let us  know.    Return in about 3 months (around 06/23/2024).  No orders of the defined types were placed in this encounter.  Lab Orders         Alpha-Gal Panel      Diagnostics: Skin Testing: Environmental allergy panel and select foods. 03/25/2024 skin testing positive to grass, dust mites, cats.  Borderline to weed pollen. Borderline to  pork, beef, lamb.  Results discussed with patient/family.  Airborne Adult Perc - 03/25/24 1514     Time Antigen Placed 1514    Allergen Manufacturer Jestine    Location Back    Number of Test 55    1. Control-Buffer 50% Glycerol Negative    2.  Control-Histamine 3+    3. Bahia Negative    4. Bermuda Negative    5. Johnson 2+    6. [place]  Blue Negative    7. Meadow Fescue Negative    8. Perennial Rye Negative    9. Timothy Negative    10. Ragweed Mix Negative    11. Cocklebur Negative    12. Plantain,  English Negative    13. Baccharis --   +/-   14. Dog Fennel Negative    15. Russian Thistle Negative    16. Lamb's Quarters Negative    17. Sheep Sorrell Negative    18. Rough Pigweed Negative    19. Marsh Elder, Rough Negative    20. Mugwort, Common Negative    21. Box, Elder Negative    22. Cedar, red Negative    23. Sweet Gum Negative    24. Pecan Pollen Negative    25. Pine Mix Negative    26. Walnut, Black Pollen Negative    27. Red Mulberry Negative    28. Ash Mix Negative    29. Birch Mix Negative    30. Beech American Negative    31. Cottonwood, Eastern Negative    32. Hickory, White Negative    33. Maple Mix Negative    34. Oak, Eastern Mix Negative    35. Sycamore Eastern Negative    36. Alternaria Alternata Negative    37. Cladosporium Herbarum Negative    38. Aspergillus Mix Negative    39. Penicillium Mix Negative    40. Bipolaris Sorokiniana (Helminthosporium) Negative    41. Drechslera Spicifera (Curvularia) Negative    42. Mucor Plumbeus Negative    43. Fusarium Moniliforme Negative    44. Aureobasidium Pullulans (pullulara) Negative    45. Rhizopus Oryzae Negative    46. Botrytis Cinera Negative    47. Epicoccum Nigrum Negative    48. Phoma Betae Negative    49. Dust Mite Mix 4+    50. Cat Hair 10,000 BAU/ml Negative    51.  Dog Epithelia Negative    52. Mixed Feathers Negative    53. Horse Epithelia Negative    54. Cockroach, German Negative    55. Tobacco Leaf Negative          Intradermal - 03/25/24 1537     Time Antigen Placed 1537    Allergen Manufacturer Jestine    Location Back    Number of Test 14    Control Negative    Bahia Negative    Bermuda Negative    7 Grass  Negative    Ragweed Mix Negative    Weed Mix Negative    Tree Mix Negative    Mold 1 Negative    Mold 2 Negative    Mold 3 Negative    Mold 4 Negative    Cat 2+    Dog Negative    Cockroach Negative          Food Adult Perc - 03/25/24 1500     Time Antigen Placed 1514    Allergen Manufacturer Jestine    Location Back    Number of allergen test 3     Control-buffer 50%  Glycerol Negative    Control-Histamine 3+    35. Pork --   +/-   36. Beef --   +/-   37. Lamb --   +/-         Previous notes and tests were reviewed. The plan was reviewed with the patient/family, and all questions/concerned were addressed.  It was my pleasure to see Chastin today and participate in her care. Please feel free to contact me with any questions or concerns.  Sincerely,  Orlan Cramp, DO Allergy & Immunology  [facility]  [facility] office: [phone] [facility] office: [phone] "

## 2024-03-26 ENCOUNTER — Encounter

## 2024-03-27 ENCOUNTER — Ambulatory Visit: Payer: Self-pay | Admitting: Allergy

## 2024-03-27 LAB — ALPHA-GAL PANEL
Allergen Lamb IgE: <0.1 kU/L
Beef IgE: <0.1 kU/L
IgE (Immunoglobulin E), Serum: 91 [IU]/mL (ref 6–495)
O215-IgE Alpha-Gal: <0.1 kU/L
Pork IgE: <0.1 kU/L

## 2024-03-27 NOTE — Progress Notes (Signed)
 Tried to reach patient to schedule labs. No answer

## 2024-04-03 ENCOUNTER — Encounter: Payer: Self-pay | Admitting: Student in an Organized Health Care Education/Training Program

## 2024-04-03 NOTE — Progress Notes (Signed)
 Scheduled labs

## 2024-05-05 ENCOUNTER — Encounter

## 2024-05-05 ENCOUNTER — Ambulatory Visit: Admitting: Student in an Organized Health Care Education/Training Program

## 2024-05-13 ENCOUNTER — Other Ambulatory Visit

## 2024-06-23 ENCOUNTER — Ambulatory Visit: Admitting: Allergy and Immunology
